# Patient Record
Sex: Male | Born: 1955 | Race: Black or African American | Hispanic: No | Smoking: Former smoker
Health system: Southern US, Community
[De-identification: ages and names within clinical notes are randomized; demographics above are authoritative.]

## PROBLEM LIST (undated history)

## (undated) DIAGNOSIS — I639 Cerebral infarction, unspecified: Secondary | ICD-10-CM

## (undated) DIAGNOSIS — Z66 Do not resuscitate: Secondary | ICD-10-CM

## (undated) DIAGNOSIS — E7800 Pure hypercholesterolemia, unspecified: Secondary | ICD-10-CM

## (undated) DIAGNOSIS — N189 Chronic kidney disease, unspecified: Secondary | ICD-10-CM

## (undated) DIAGNOSIS — C180 Malignant neoplasm of cecum: Principal | ICD-10-CM

## (undated) DIAGNOSIS — Z992 Dependence on renal dialysis: Secondary | ICD-10-CM

## (undated) DIAGNOSIS — C801 Malignant (primary) neoplasm, unspecified: Secondary | ICD-10-CM

## (undated) DIAGNOSIS — N186 End stage renal disease: Secondary | ICD-10-CM

## (undated) DIAGNOSIS — I10 Essential (primary) hypertension: Secondary | ICD-10-CM

## (undated) DIAGNOSIS — E119 Type 2 diabetes mellitus without complications: Secondary | ICD-10-CM

## (undated) DIAGNOSIS — K219 Gastro-esophageal reflux disease without esophagitis: Secondary | ICD-10-CM

## (undated) HISTORY — DX: Malignant neoplasm of cecum: C18.0

## (undated) HISTORY — DX: Do not resuscitate: Z66

## (undated) HISTORY — DX: End stage renal disease: N18.6

## (undated) HISTORY — PX: DIALYSIS FISTULA CREATION: SHX611

---

## 1998-09-27 HISTORY — PX: OTHER SURGICAL HISTORY: SHX169

## 1999-09-28 HISTORY — PX: AMPUTATION TOE: SHX6595

## 2007-08-28 ENCOUNTER — Ambulatory Visit: Payer: Self-pay | Admitting: Cardiology

## 2008-09-27 DIAGNOSIS — Z992 Dependence on renal dialysis: Secondary | ICD-10-CM

## 2008-09-27 HISTORY — DX: Dependence on renal dialysis: Z99.2

## 2014-07-28 DIAGNOSIS — C801 Malignant (primary) neoplasm, unspecified: Secondary | ICD-10-CM

## 2014-07-28 HISTORY — PX: COLECTOMY: SHX59

## 2014-07-28 HISTORY — DX: Malignant (primary) neoplasm, unspecified: C80.1

## 2014-09-16 ENCOUNTER — Other Ambulatory Visit (HOSPITAL_COMMUNITY): Payer: Self-pay | Admitting: Nephrology

## 2014-09-16 ENCOUNTER — Encounter (HOSPITAL_COMMUNITY): Payer: Self-pay

## 2014-09-16 ENCOUNTER — Ambulatory Visit (HOSPITAL_COMMUNITY)
Admission: RE | Admit: 2014-09-16 | Discharge: 2014-09-16 | Disposition: A | Discharge status: Home / Self Care | Payer: PRIVATE HEALTH INSURANCE | Source: Ambulatory Visit | Attending: Nephrology | Admitting: Nephrology

## 2014-09-16 DIAGNOSIS — Y828 Other medical devices associated with adverse incidents: Secondary | ICD-10-CM | POA: Insufficient documentation

## 2014-09-16 DIAGNOSIS — Y832 Surgical operation with anastomosis, bypass or graft as the cause of abnormal reaction of the patient, or of later complication, without mention of misadventure at the time of the procedure: Secondary | ICD-10-CM | POA: Insufficient documentation

## 2014-09-16 DIAGNOSIS — N186 End stage renal disease: Secondary | ICD-10-CM | POA: Diagnosis not present

## 2014-09-16 DIAGNOSIS — Z992 Dependence on renal dialysis: Secondary | ICD-10-CM | POA: Insufficient documentation

## 2014-09-16 DIAGNOSIS — T82868A Thrombosis of vascular prosthetic devices, implants and grafts, initial encounter: Secondary | ICD-10-CM | POA: Insufficient documentation

## 2014-09-16 DIAGNOSIS — E7800 Pure hypercholesterolemia, unspecified: Secondary | ICD-10-CM | POA: Insufficient documentation

## 2014-09-16 HISTORY — DX: Chronic kidney disease, unspecified: N18.9

## 2014-09-16 HISTORY — DX: Pure hypercholesterolemia, unspecified: E78.00

## 2014-09-16 LAB — POTASSIUM: POTASSIUM: 5.9 meq/L — AB (ref 3.7–5.3)

## 2014-09-16 MED ORDER — FENTANYL CITRATE 0.05 MG/ML IJ SOLN
INTRAMUSCULAR | Status: AC
  Filled 2014-09-16: qty 4

## 2014-09-16 MED ORDER — FENTANYL CITRATE 0.05 MG/ML IJ SOLN
INTRAMUSCULAR | Status: AC | PRN
  Administered 2014-09-16: 50 ug via INTRAVENOUS

## 2014-09-16 MED ORDER — GELATIN ABSORBABLE 12-7 MM EX MISC
CUTANEOUS | Status: AC
  Filled 2014-09-16: qty 1

## 2014-09-16 MED ORDER — CEFAZOLIN SODIUM-DEXTROSE 2-3 GM-% IV SOLR
2.0000 g | Freq: Once | INTRAVENOUS | Status: AC
  Administered 2014-09-16: 2 g via INTRAVENOUS

## 2014-09-16 MED ORDER — SODIUM CHLORIDE 0.9 % IJ SOLN
10.0000 mL | Freq: Two times a day (BID) | INTRAMUSCULAR | Status: DC
Start: 2014-09-16 — End: 2014-09-17

## 2014-09-16 MED ORDER — MIDAZOLAM HCL 2 MG/2ML IJ SOLN
INTRAMUSCULAR | Status: AC
  Filled 2014-09-16: qty 4

## 2014-09-16 MED ORDER — MIDAZOLAM HCL 2 MG/2ML IJ SOLN
INTRAMUSCULAR | Status: AC | PRN
  Administered 2014-09-16: 1 mg via INTRAVENOUS

## 2014-09-16 MED ORDER — SODIUM CHLORIDE 0.9 % IJ SOLN
10.0000 mL | INTRAMUSCULAR | Status: DC | PRN
  Administered 2014-09-16: 10 mL

## 2014-09-16 MED ORDER — LIDOCAINE-EPINEPHRINE (PF) 1 %-1:200000 IJ SOLN
INTRAMUSCULAR | Status: AC
  Filled 2014-09-16: qty 10

## 2014-09-16 MED ORDER — HEPARIN SOD (PORK) LOCK FLUSH 100 UNIT/ML IV SOLN
500.0000 [IU] | INTRAVENOUS | Status: DC | PRN
  Administered 2014-09-16: 500 [IU]

## 2014-09-16 MED ORDER — HEPARIN SOD (PORK) LOCK FLUSH 100 UNIT/ML IV SOLN: 500.0000 [IU] | INTRAVENOUS | Status: DC

## 2014-09-16 MED ORDER — CEFAZOLIN SODIUM-DEXTROSE 2-3 GM-% IV SOLR
INTRAVENOUS | Status: AC
  Administered 2014-09-16: 2 g via INTRAVENOUS
  Filled 2014-09-16: qty 50

## 2014-09-16 MED ORDER — HEPARIN SODIUM (PORCINE) 1000 UNIT/ML IJ SOLN
INTRAMUSCULAR | Status: AC
  Filled 2014-09-16: qty 1

## 2014-09-16 MED ORDER — SODIUM CHLORIDE 0.9 % IV SOLN
INTRAVENOUS | Status: AC | PRN
  Administered 2014-09-16: 50 mL/h via INTRAVENOUS

## 2014-09-16 NOTE — Discharge Instructions (Addendum)
Vascular Access for Hemodialysis °A vascular access is a connection between two blood vessels that allows blood to be easily removed from the body and returned to the body during hemodialysis. Hemodialysis is a procedure in which a machine outside of the body filters the blood. There are three types of vascular accesses:  °· Arteriovenous fistula. This is a connection between an artery and a vein (usually in the arm) that is made by sewing them together. Blood in the artery flows directly into the vein, causing it to get larger over time. This makes it easier for the vein to be used for hemodialysis. An arteriovenous fistula takes 1-6 months to develop after surgery.   °· Arteriovenous graft. This is a connection between an artery and a vein in the arm that is made with a tube. An arteriovenous graft can be used within 2-3 weeks of surgery.   °· Venous catheter. This is a thin, flexible tube that is placed in a large vein (usually in the neck, chest, or groin). A venous catheter for hemodialysis contains two tubes that come out of the skin. A venous catheter can be used right away. It is usually used as a temporary access if you need hemodialysis before a fistula or graft has developed. It may also be used as a permanent access if a fistula or graft cannot be created. °WHICH TYPE OF ACCESS IS BEST FOR ME? °The type of access that is best for you depends on the size and strength of your veins.  °A fistula is usually the preferred type of access. It can last several years and is less likely than the other types of accesses to become infected or to cause blood clots within a blood vessel (thrombosis). However, a fistula is not an option for everyone. If your veins are not the right size, a graft may be used instead. Grafts require you to have strong veins. If your veins are not strong enough for a graft, a catheter may be used. Catheters are more likely than fistulas and grafts to become infected or to have thrombosis.   °Sometimes, only one type of access is an option. Your health care provider will help you determine which type of access is best for you.  °HOW IS A VASCULAR ACCESS USED? °The way the access is used depends on the type of access:  °· If the access is a fistula or graft, two needles are inserted through the skin into the access before each hemodialysis session. Blood leaves the body through one of the needles and travels through a tube to the hemodialysis machine (dialyzer). It then flows through another tube and returns to the body through the second needle.   °· If the access is a catheter, one tube is connected directly to the tube that leads to the dialyzer and the other is connected to a tube that leads away from the dialyzer. Blood leaves the body through one tube and returns to the body through the other.   °WHAT KIND OF PROBLEMS CAN OCCUR WITH VASCULAR ACCESSES? °· Blood clots within a blood vessel (thrombosis). Thrombosis can lead to a narrowing of a blood vessel or tube (stenosis). If thrombosis occurs frequently, another access site may be created as a backup.   °· Infection.   °These problems are most likely to occur with a venous catheter and least likely to occur with an arteriovenous fistula.  °HOW DO I CARE FOR MY VASCULAR ACCESS? °Wear a medical alert bracelet. This tells health care providers that you are   a dialysis patient in the case of an emergency and allows them to care for your veins appropriately. If you have a graft or fistula:   A "bruit" is a noise that is heard with a stethoscope and a "thrill" is a vibration felt over the graft or fistula. The presence of the bruit and thrill indicates that the access is working. You will be taught to feel for the thrill each day. If this is not felt, the access may be clotted. Call your health care provider.   You may use the arm where your vascular access is located freely after the site heals. Keep the following in mind:   Avoid pressure on  the arm.   Avoid lifting heavy objects with the arm.   Avoid sleeping on the arm.   Avoid wearing tight-sleeved shirts or jewelry around the graft or fistula.   Do not allow blood pressure monitoring or needle punctures on the side where the graft or fistula is located.   With permission from your health care provider, you may do exercises to help with blood flow through a fistula. These exercises involve squeezing a rubber ball or other soft objects as instructed. SEEK MEDICAL CARE IF:   Chills develop.   You have an oral temperature above 102 F (38.9 C).  Swelling around the graft or fistula gets worse.   New pain develops.   Pus or other fluid (drainage) is seen at the vascular access site.   Skin redness or red streaking is seen on the skin around, above, or below the vascular access. SEEK IMMEDIATE MEDICAL CARE IF:   Pain, numbness, or an unusual pale skin color develops in the hand on the side of your fistula.   Dizziness or weakness develops that you have not had before.   The vascular access has bleeding that cannot be easily controlled. Document Released: 12/04/2002 Document Revised: 01/28/2014 Document Reviewed: 01/30/2013 [facility] Patient Information 2015 [place], [place]. This information is not intended to replace advice given to you by your health care provider. Make sure you discuss any questions you have with your health care provider. Conscious Sedation Sedation is the use of medicines to promote relaxation and relieve discomfort and anxiety. Conscious sedation is a type of sedation. Under conscious sedation you are less alert than normal but are still able to respond to instructions or stimulation. Conscious sedation is used during short medical and dental procedures. It is milder than deep sedation or general anesthesia and allows you to return to your regular activities sooner.  LET [facility] CARE PROVIDER KNOW ABOUT:   Any allergies you  have.  All medicines you are taking, including vitamins, herbs, eye drops, creams, and over-the-counter medicines.  Use of steroids (by mouth or creams).  Previous problems you or members of your family have had with the use of anesthetics.  Any blood disorders you have.  Previous surgeries you have had.  Medical conditions you have.  Possibility of pregnancy, if this applies.  Use of cigarettes, alcohol, or illegal drugs. RISKS AND COMPLICATIONS Generally, this is a safe procedure. However, as with any procedure, problems can occur. Possible problems include:  Oversedation.  Trouble breathing on your own. You may need to have a breathing tube until you are awake and breathing on your own.  Allergic reaction to any of the medicines used for the procedure. BEFORE THE PROCEDURE  You may have blood tests done. These tests can help show how well your kidneys and liver are  working. They can also show how well your blood clots.  A physical exam will be done.  Only take medicines as directed by your health care provider. You may need to stop taking medicines (such as blood thinners, aspirin, or nonsteroidal anti-inflammatory drugs) before the procedure.   Do not eat or drink at least 6 hours before the procedure or as directed by your health care provider.  Arrange for a responsible adult, family member, or friend to take you home after the procedure. He or she should stay with you for at least 24 hours after the procedure, until the medicine has worn off. PROCEDURE   An intravenous (IV) catheter will be inserted into one of your veins. Medicine will be able to flow directly into your body through this catheter. You may be given medicine through this tube to help prevent pain and help you relax.  The medical or dental procedure will be done. AFTER THE PROCEDURE  You will stay in a recovery area until the medicine has worn off. Your blood pressure and pulse will be checked.    Depending on the procedure you had, you may be allowed to go home when you can tolerate liquids and your pain is under control. Document Released: 06/08/2001 Document Revised: 09/18/2013 Document Reviewed: 05/21/2013 [facility] Patient Information 2015 [place], [place]. This information is not intended to replace advice given to you by your health care provider. Make sure you discuss any questions you have with your health care provider.

## 2014-09-16 NOTE — Sedation Documentation (Signed)
Report to L. Smith RN.

## 2014-09-16 NOTE — Procedures (Signed)
Successful RT IJ HD CATH TIP SVC/RA NO COMP STABLE READY FOR USE

## 2014-09-16 NOTE — Sedation Documentation (Signed)
Patient denies pain and is resting comfortably. Can be discharged at 1430. IV team notified to remove portacath access.

## 2014-09-16 NOTE — H&P (Signed)
Chief Complaint: Clotted Rt lower arm dialysis fistula  Referring Physician(s): Befekadu,Belayenh S  History of Present Illness: Nathaniel Johnston is a 58 y.o. male  Last use 09/12/14: slow  Unable to use on Sat: clotted Pt has had declot previously- no records at this facility Request made per Dr Hinda Lenis for fistula thrombolysis Now scheduled for Rt arm dialysis fistula thrombolysis with possible angioplasty/stent placement Possible dialysis catheter placement if needed   Past Medical History  Diagnosis Date  . Chronic kidney disease   . High cholesterol     Past Surgical History  Procedure Laterality Date  . Dialysis fistula creation Right     Allergies: Review of patient's allergies indicates no known allergies.  Medications: Prior to Admission medications   Medication Sig Start Date End Date Taking? Authorizing Provider  amLODipine (NORVASC) 10 MG tablet Take 2 tablets by mouth. 07/29/14  Yes Historical Provider, MD  calcium acetate (PHOSLO) 667 MG capsule Take 1 capsule by mouth. 07/15/14  Yes Historical Provider, MD  glipiZIDE (GLUCOTROL) 10 MG tablet Take 1 tablet by mouth. 07/01/14  Yes Historical Provider, MD  Iron Polysacch Cmplx-B12-FA 150-0.025-1 MG CAPS Take 1 capsule by mouth. 08/27/14  Yes Historical Provider, MD  labetalol (NORMODYNE) 300 MG tablet Take 2 tablets by mouth. 06/02/10  Yes Historical Provider, MD  lisinopril (PRINIVIL,ZESTRIL) 40 MG tablet Take 1 tablet by mouth. 06/02/10  Yes Historical Provider, MD  loperamide (IMODIUM) 2 MG capsule Take 2 mg by mouth. 08/29/14  Yes Historical Provider, MD  ondansetron (ZOFRAN) 8 MG tablet Take 8 mg by mouth. 08/29/14  Yes Historical Provider, MD  potassium chloride (K-DUR) 10 MEQ tablet Once daily 08/27/14  Yes Historical Provider, MD  sevelamer carbonate (RENVELA) 800 MG tablet Take 1 tablet by mouth. 07/30/14  Yes Historical Provider, MD  simvastatin (ZOCOR) 80 MG tablet Take 1 tablet by mouth. 08/17/14  Yes  Historical Provider, MD  Skin Protectants, Misc. (EUCERIN) cream Apply topically. 08/29/14  Yes Historical Provider, MD  triamcinolone cream (KENALOG) 0.1 % Apply topically. 06/05/14  Yes Historical Provider, MD    History reviewed. No pertinent family history.  History   Social History  . Marital Status: Married    Spouse Name: N/A    Number of Children: N/A  . Years of Education: N/A   Social History Main Topics  . Smoking status: Unknown If Ever Smoked  . Smokeless tobacco: None  . Alcohol Use: None  . Drug Use: None  . Sexual Activity: None   Other Topics Concern  . None   Social History Narrative  . None    Review of Systems: A 12 point ROS discussed and pertinent positives are indicated in the HPI above.  All other systems are negative.  Review of Systems  Constitutional: Negative for activity change.  Respiratory: Negative for chest tightness and shortness of breath.   Cardiovascular: Negative for chest pain.  Gastrointestinal: Negative for abdominal pain.  Musculoskeletal: Positive for joint swelling.       Lt ankle sprain  Neurological: Negative for weakness.  Psychiatric/Behavioral: Negative for behavioral problems and confusion.    Vital Signs: BP 160/65 mmHg  Temp(Src) 98.4 F (36.9 C) (Oral)  Resp 18  SpO2 100%  Physical Exam  Constitutional: He is oriented to person, place, and time. He appears well-nourished.  Cardiovascular: Normal rate and regular rhythm.   No murmur heard. Pulmonary/Chest: Effort normal. He has no wheezes.  Abdominal: Soft. Bowel sounds are normal.  Musculoskeletal: Normal range  of motion.  Neurological: He is alert and oriented to person, place, and time.  Skin: Skin is warm and dry.  Psychiatric: He has a normal mood and affect. His behavior is normal. Judgment and thought content normal.    Imaging: No results found.  Labs:  CBC: No results for input(s): WBC, HGB, HCT, PLT in the last 8760 hours.  COAGS: No  results for input(s): INR, APTT in the last 8760 hours.  BMP: No results for input(s): NA, K, CL, CO2, GLUCOSE, BUN, CALCIUM, CREATININE, GFRNONAA, GFRAA in the last 8760 hours.  Invalid input(s): CMP  LIVER FUNCTION TESTS: No results for input(s): BILITOT, AST, ALT, ALKPHOS, PROT, ALBUMIN in the last 8760 hours.  TUMOR MARKERS: No results for input(s): AFPTM, CEA, CA199, CHROMGRNA in the last 8760 hours.  Assessment and Plan:  Rt arm dialysis fistula thrombolysis possible pta/stnt placement Possible dialysis catheter placement Pt aware of procedure benefits and risks and agreeable to proceed Consent signed and in chart  Thank you for this interesting consult.  I greatly enjoyed meeting Nathaniel Johnston and look forward to participating in their care.   I spent a total of 20 minutes face to face in clinical consultation, greater than 50% of which was counseling/coordinating care for R arm dialysis fistula thrombolysis/poss catheter placement  Signed: Dariyon Urquilla A 09/16/2014, 11:34 AM

## 2015-06-11 ENCOUNTER — Other Ambulatory Visit (HOSPITAL_COMMUNITY): Payer: Self-pay

## 2015-06-12 NOTE — Patient Instructions (Addendum)
Nathaniel Johnston  06/12/2015     @PREFPERIOPPHARMACY @   Your procedure is scheduled on 06/16/2015.  Report to [facility] at 6:30 A.M.  Call this number if you have problems the morning of surgery:  [phone]   Remember:  Do not eat food or drink liquids after midnight.  Take these medicines the morning of surgery with A SIP OF WATER Amlodipine, Lisinopril, Zofran, Renvela, Labetalol   Do not wear jewelry, make-up or nail polish.  Do not wear lotions, powders, or perfumes.  You may wear deodorant.  Do not shave 48 hours prior to surgery.  Men may shave face and neck.  Do not bring valuables to the hospital.  [facility] is not responsible for any belongings or valuables.  Contacts, dentures or bridgework may not be worn into surgery.  Leave your suitcase in the car.  After surgery it may be brought to your room.  For patients admitted to the hospital, discharge time will be determined by your treatment team.  Patients discharged the day of surgery will not be allowed to drive home.    Please read over the following fact sheets that you were given. Anesthesia Post-op Instructions     PATIENT INSTRUCTIONS POST-ANESTHESIA  IMMEDIATELY FOLLOWING SURGERY:  Do not drive or operate machinery for the first twenty four hours after surgery.  Do not make any important decisions for twenty four hours after surgery or while taking narcotic pain medications or sedatives.  If you develop intractable nausea and vomiting or a severe headache please notify your doctor immediately.  FOLLOW-UP:  Please make an appointment with your surgeon as instructed. You do not need to follow up with anesthesia unless specifically instructed to do so.  WOUND CARE INSTRUCTIONS (if applicable):  Keep a dry clean dressing on the anesthesia/puncture wound site if there is drainage.  Once the wound has quit draining you may leave it open to air.  Generally you should leave the bandage intact for twenty four hours  unless there is drainage.  If the epidural site drains for more than 36-48 hours please call the anesthesia department.  QUESTIONS?:  Please feel free to call your physician or the hospital operator if you have any questions, and they will be happy to assist you.      Cataract Surgery  A cataract is a clouding of the lens of the eye. When a lens becomes cloudy, vision is reduced based on the degree and nature of the clouding. Surgery may be needed to improve vision. Surgery removes the cloudy lens and usually replaces it with a substitute lens (intraocular lens, IOL). LET YOUR EYE DOCTOR KNOW ABOUT:  Allergies to food or medicine.  Medicines taken including herbs, eye drops, over-the-counter medicines, and creams.  Use of steroids (by mouth or creams).  Previous problems with anesthetics or numbing medicine.  History of bleeding problems or blood clots.  Previous surgery.  Other health problems, including diabetes and kidney problems.  Possibility of pregnancy, if this applies. RISKS AND COMPLICATIONS  Infection.  Inflammation of the eyeball (endophthalmitis) that can spread to both eyes (sympathetic ophthalmia).  Poor wound healing.  If an IOL is inserted, it can later fall out of proper position. This is very uncommon.  Clouding of the part of your eye that holds an IOL in place. This is called an "after-cataract." These are uncommon but easily treated. BEFORE THE PROCEDURE  Do not eat or drink anything except small amounts of water for 8  to 12 before your surgery, or as directed by your caregiver.  Unless you are told otherwise, continue any eye drops you have been prescribed.  Talk to your primary caregiver about all other medicines that you take (both prescription and nonprescription). In some cases, you may need to stop or change medicines near the time of your surgery. This is most important if you are taking blood-thinning medicine.Do not stop medicines unless you  are told to do so.  Arrange for someone to drive you to and from the procedure.  Do not put contact lenses in either eye on the day of your surgery. PROCEDURE There is more than one method for safely removing a cataract. Your doctor can explain the differences and help determine which is best for you. Phacoemulsification surgery is the most common form of cataract surgery.  An injection is given behind the eye or eye drops are given to make this a painless procedure.  A small cut (incision) is made on the edge of the clear, dome-shaped surface that covers the front of the eye (cornea).  A tiny probe is painlessly inserted into the eye. This device gives off ultrasound waves that soften and break up the cloudy center of the lens. This makes it easier for the cloudy lens to be removed by suction.  An IOL may be implanted.  The normal lens of the eye is covered by a clear capsule. Part of that capsule is intentionally left in the eye to support the IOL.  Your surgeon may or may not use stitches to close the incision. There are other forms of cataract surgery that require a larger incision and stitches to close the eye. This approach is taken in cases where the doctor feels that the cataract cannot be easily removed using phacoemulsification. AFTER THE PROCEDURE  When an IOL is implanted, it does not need care. It becomes a permanent part of your eye and cannot be seen or felt.  Your doctor will schedule follow-up exams to check on your progress.  Review your other medicines with your doctor to see which can be resumed after surgery.  Use eye drops or take medicine as prescribed by your doctor. Document Released: 09/02/2011 Document Revised: 01/28/2014 Document Reviewed: 09/02/2011 [facility] Patient Information 2015 [place], [place]. This information is not intended to replace advice given to you by your health care provider. Make sure you discuss any questions you have with your health care  provider.

## 2015-06-13 ENCOUNTER — Other Ambulatory Visit: Payer: Self-pay

## 2015-06-13 ENCOUNTER — Encounter (HOSPITAL_COMMUNITY)
Admission: RE | Admit: 2015-06-13 | Discharge: 2015-06-13 | Disposition: A | Discharge status: Home / Self Care | Payer: Medicaid Other | Source: Ambulatory Visit | Attending: Ophthalmology | Admitting: Ophthalmology

## 2015-06-13 ENCOUNTER — Encounter (HOSPITAL_COMMUNITY): Payer: Self-pay

## 2015-06-13 DIAGNOSIS — H268 Other specified cataract: Secondary | ICD-10-CM | POA: Insufficient documentation

## 2015-06-13 DIAGNOSIS — Z01818 Encounter for other preprocedural examination: Secondary | ICD-10-CM | POA: Diagnosis present

## 2015-06-13 HISTORY — DX: Essential (primary) hypertension: I10

## 2015-06-13 HISTORY — DX: Cerebral infarction, unspecified: I63.9

## 2015-06-13 HISTORY — DX: Gastro-esophageal reflux disease without esophagitis: K21.9

## 2015-06-13 HISTORY — DX: Type 2 diabetes mellitus without complications: E11.9

## 2015-06-13 HISTORY — DX: Malignant (primary) neoplasm, unspecified: C80.1

## 2015-06-13 MED ORDER — TETRACAINE HCL 0.5 % OP SOLN
OPHTHALMIC | Status: AC
  Filled 2015-06-13: qty 2

## 2015-06-13 MED ORDER — CYCLOPENTOLATE-PHENYLEPHRINE OP SOLN OPTIME - NO CHARGE
OPHTHALMIC | Status: AC
  Filled 2015-06-13: qty 2

## 2015-06-13 MED ORDER — LIDOCAINE HCL 3.5 % OP GEL
OPHTHALMIC | Status: AC
  Filled 2015-06-13: qty 1

## 2015-06-15 NOTE — H&P (Signed)
Surgeon's preoperative summary of results and expectations for patient: Nathaniel Johnston    Activity limitations caused by visual disabilty related to cataract(s). Pt states that after sneezing early this morning they started seeing spider webs OU that blocks out part of vision.  Pt was seen at [facility] ED early this morning   Best corrected visual acuity:   Right Eye Left Eye  Distance Acuity   200   Near Acuity   16    I certify that my patient's record contains the following information:  The record contains digital ultrasonic biometry for the purpose of determining the correct implant power for the operative eye.  The patients' impairment of visual function is believed not to be correctable with a tolerable change in glasses or contact lenses.  Cataract (in the operative eye) is believed to be significantly contributing to the patient's visual impairment.  The patient desires surgical correction;  the risks, benefits, and alternatives have been expalined; and a reasonable expectation exists that lens surgery will significantly improve both the visual and functional status of the patient.  I certify the statements on this sheet are true to the best of my knowledge.   Patient's statement of visual disability OD:  Has indicated that he/she has difficulty in the following tasks associated with daily living: difficulty reading small print on books, newspapers, labels, etc. difficulty watching television due to glare or inability to read captions difficulty driving due to inability to read traffic signals or street signs difficulty driving at night due to glare, halos, or rings Blurry or hazy vision Light sensitivity or experiencing glare on sunny days Decreased vision at night difficulty recognizing people from a distance    *   Lala Lund, MD 06/11/15 11:18:45 Digitally signed to expedite the transference of documentation.      [place], [place] Lala Lund., M.D. Surgical Request and Orders  Procedure Date:  06/16/15       Patient:  Nathaniel Johnston, Nathaniel Johnston  SSN:  [id number]   DOB:  [birth year]   Sex:  male  Address:  [address]  [place],  [place]   [address]  Home Phone:  [phone]  Work:   cell:  [phone]   Primary Insurance:  Rebecca ID#:  [id number]  Grp#:  [id number]  Precert required?  No Pre-cert #:       Secondary Ins:  Medicaid  ID# [id number]  Grp#:    Precert required?  No Pre-cert #:    Procedure:  Phacoemulsification with lens implant OD (RIGHT EYE)  Preoperative Diagnosis/symptoms:  surgical cataract of right eye  Reason for Surgery:  The patient complains of difficulty performing daily activities, including but not limited to reading and drivingplease see chief complaint for details. Special instructions/orders:  Please add 4.0 mL Omidria (phenylephrine 1% / ketorolac 0.3%) to 500 mL BSS irrigation solution    Lala Lund, MD 06/11/15 11:19:47 Digitally signed to expedite the transference of documentation.        [facility] PHYSCIAN ORDERS:  ANESTHESIA:  Topical/Monitored Anesthesia Care   ALLERGIES:    NKDA   HOSPITAL DISPOSITION:  Short Stay Outpatient   LABORATORY:  Per Anesthesia request only. Please call Dr. Iona Hansen' office for panic values  [phone] or [phone]).   X-RAY:  Per Anesthesia request only.  HEART STATION:  EKG  Per anesthesia request only.  PHARMACY: Purchased at retail pharmacy:   1)  Gatifloxacin 0.5% Eye drops, 1 drop OU qid, begin 3 days prior to surgery, repeat in a.m. prior to leaving for the hospital, and to operated eye only qid, begin 3 hours post op.  Moxifloxacin 0.5% may be substituted for gatifloxacin using the same instructions.    2)   Ketorolac 0.5% Eye drops, 1 drop to each eye qid, begin one day prior to surgery and before leaving for the hospital, and to operative eye only qid, begin 3 hours post op. 3)   prednisolone acetate 1%  eye drops, 1 drop qid to right eye, begin 3 hours post op.      PRE OP ORDERS:   1)   Instil 1 drop of cyclomydril* Oph q 5 min. X 3 to the right eye on arrival in Short Stay Unit. * NOTE  cyclopentylate 1% and phenylephrine .25% or homatropine 5% and phenylephrine .25% ophthalmic drops may be substitued for cyclomydril, one drop of each q 5 minutes x 3 doses to right eye on arrival to [facility]                          2)   Instill 1 drop tetracaine 0.5% Oph to right eye q 5 minutes x 3 on arrival to short stay and once on   arrival to OR. 3)   Instill 1 drop Betadine 5% eye drops to right eye, following tetracaine drops and immediately    upon arrival in OR. 4)   Instill 2 drops lidocaine Oph gel 3.5% to right eye in OR before pre-op prep   SPECIAL INSTRUCTIONS/ORDERS:  Please add 4.0 mL Omidria (phenylephrine 1% / ketorolac 0.3%) to 500 mL BSS irrigation solution     POST OP DISCHARGE ORDERS/PATIENT INSTRUCTIONS: 1)  Resume Diet/previous med orders per med reconciliation form:   2)  F/U appt at Dr. Iona Hansen Office in [place], 06/18/15@11 :15 am   3)   D/C from Short Stay per protocol. 4)   Begin Pred Forte (prednisolone acetate 1%), Ketorolac 0.5% and  gatifloxacin eye drops;  1 drop each 4 times daily to operative eye, begin 3 hrs. post D/C from Short Stay Unit.  Moxifloxacin 0.5% may be substituted for gatifloxacin using the same instructions. 5)   Page Dr. Iona Hansen via cell phone [phone]) if significant pain in or around operative eye. 6)   If  patient on Plavix pre-op, restart at pre op. dose on evening of surgery. 7)   Wear dark glasses as necessary for excessive light sensitivity post op. 8)   Instruct patient not to forcefully rub operative eye. 9)   Keep eye dry for 1 week.  Patient may gently wipe lids with damp wash cloth. 10)  Patient may resume normal occupational activities in one week and resume driving as tolerated after the first  post operative visit. 11)  It is normal to have  blurred vision and a scratchy sensation following surgery.      Lala Lund, MD 06/11/15 11:19:33 Digitally signed to expedite the transference of documentation.*    I have received a copy of "Dr. Iona Hansen Post Operative Instructions for Cataract Patients" and will abide by them. Patient signature:  Witness signature:  Date/Time:      [facility] [address] [place], [place] [address]   Dr. Iona Hansen Post Operative Instructions For Cataract Patients  These instructions are for Nathaniel Johnston  and pertain to the operative RIGHT EYE.  1)  Resume your Diet and previous oral medicines as you normally use them.   2)  Your Follow up appointment is at Dr. Iona Hansen' Office in [place] on 06/18/15@11 :15 am   3)   You may leave the hospital when your driver is present and your nurse releases you. 4)  Begin Pred Forte (prednisolone acetate 1%), Ketorolac 0.5% and gatifloxacin 0.5% eye drops;  1 drop each 4 times daily to operative eye, begin 3 hrs. Following discharge from Short Stay Unit.  Moxifloxacin 0.5%) may be substituted for gatifloxacin using the same instructions. 5)  Page Dr. Iona Hansen via beeper [phone]) for significant pain in or around operative eye that is not relieved with Tylenol. 6)   If you took Plavix before surgery, restart it at the usual dose on the evening of surgery. 7)   Wear dark glasses as necessary for excessive light sensitivity. 8)   Do not to forcefully rub your right eye. 9)   Keep your right eye dry for 1 week.  You may gently clean your eyelids with a damp wash cloth. 10) You may resume normal occupational activities in one week and resume driving as tolerated after the first post operative visit. 11)  It is normal to have blurred vision and a scratchy sensation following surgery.         Pre Surgical History and Physical Exam  Patient:  Nathaniel Johnston     Date:      06/11/2015 11:29:54  AM  Chief Complaint Pt states that after sneezing early this morning they started seeing spider webs OU that blocks out part of vision.  Pt was seen at [facility] ED early this morning  +HPI:   Location OU  Quality Pt states that after sneezing early this morning they started seeing spider webs OU that blocks out part of vision.  Severity moderate  Duration x hrs  Timing constant  Context Hx:  RD OS, DM x 15 yrs, BS@stable , A1C-6.5, HTN-controlled  Modifying Factors Art tears BID OU;  Dialysis T, TH, S  Medical History:              Vit hem OS Diabetes, uncomplicated Cataract, brunescent OD Cataract, PSC OD Cataract, Nuclear OS Blurred vision Presbyopia Astigmatism Myopia Family Hx:                      non contributary  Social Hx:                        Allergy:                          NKDA Med List:                        Amlodipine 10mg , Cephalexin 500mg , Glipizide 10mg , Labetalol 100mg , Lisinopril 40mg , Simvastatin 80mg ,Tramadol 50 mg  Notes:                               Review of Systems:   The patient admits to: and Hypertension  The patient admits to: Hx of Colon Cancer  The patient admits WN:[id number] Renal Failure  The patient admits  to: Diabetes   Physical Exam: General:  The patient is a well nourished, well developed White male.  Systolic: Diastolic: Pulse:  [phone]    SHEENT:          Normocephalic, normally pigmented, atraumatic  Neck:              without mass, midline structural deviation or malposition      Chest:             clear to auscultation bilaterally Breast:            Deferred  Heart:             The heart sounds are normal.  There is no murmur or gallop present  Abdomen:       Normal bowel sounds present  Pelvic:            Deferred GU:                 Deferred Extremities:    symmetric without pretibial edema Skin:               Deferred  Neurological:  Pt oriented, cranial nerves grossly normal   Eye Exam:   Best corrected Vision Right:   20/      Left:  20/200  Spectacle Rx Right:   Spectacle Rx Left:   -0.25-0.75x180   Eye Exam                RIGHT   Lids:                       Lids function normally and are symmetrically, and normally positioned with clean/quiet margins OU.   Conjunctiva:           White and quiet  Cornea:                  Clear, with adequate cell count  Anterior Chamber:  deep and quiet  Iris:                        The iris is uniformly colored with a round symmetric pupil.  Lens:                      4+ nuclear sclerosis  Anterior Vitreous:   Clear and quiet Tonometry, right:   13    left:  14    Fundus, disc:           The discs are  normal, healthy appearing OU.  Fundus, macula:      The background, macula, and vessels are normal in appearance.  Dr. Iona Hansen Notes:    Please add 4.0 mL Omidria (phenylephrine 1% / ketorolac 0.3%) to 500 mL BSS irrigation solution no items of interest  Ultrasonic Digital Biometry OD:  23.50                       OD                        PC Lens (primary lens)  Alcon [id number]   Lens Power +21.00   Calc. Ametropia plano         AC Lens (Alt. lens) Alcon [id number]      Lens Power +18.00  Diagnosis:     Surgical Cataract RIGHT has been specifically requested by the patient with reasonable eexpectation of achieving stated visual/functional goals.  Alternative treatments, including optical correction have been discussed with the patient as well as the mitigating effects of ocular co-morbidities on final vision.   Patient's statement of visual disability OD:  Has indicated that he/she has difficulty in the following tasks associated with daily living: difficulty reading small print on books, newspapers, labels, etc. difficulty watching television due to glare or inability to read captions difficulty driving due to inability to read traffic signals or street signs difficulty driving at night due to glare, halos, or rings Blurry or hazy vision Light sensitivity or  experiencing glare on sunny days Decreased vision at night difficulty recognizing people from a distance     Surgical Plan:  Phacoemulsification with lens implant OD (RIGHT EYE)   Post Op Appt:  06/18/15@11 :15 am  in [facility].    Lala Lund, MD 06/11/15 11:19:15 Digitally signed to expedite the transference of documentation.*    [facility] EYE DROP INSTRUCTIONS BEFORE SURGERY THESE MEDICATIONS MUST BE PURCHASED AT YOUR PHARMACY BEFORE YOUR PREOP OFFICE EVALUATION  GATIFLOXACIN  0.5% EYE DROPS OR MOXIFLOXACIN 0.5% EYE DROPS:  USE ONE DROP TO BOTH EYES 4 TIMES DAILY BEGINNING ON THE FRIDAY MORNING BEFORE SURGERY AND ONCE TO BOTH EYES BEFORE LEAVING FOR SURGERY MONDAY MORNING.  KETOROLAC 0.5% EYE DROPS:  USE ONE DROP 4 TIMES DAILY TO BOTH EYES BEGINNING ON THE SUNDAY MORNING BEFORE SURGERY AND AGAIN ON MONDAY MORNING BEFORE LEAVING FOR SURGERY.  PREDNISOLONE ACETATE 1% EYE DROPS:  DO NOT USE THESE BEFORE SURGERY  PLEASE TAKE YOUR DROPS WITH YOU TO [place] AND MAKE SURE THEY ARE RETURNED TO YOU BEFORE YOU LEAVE.  YOU WILL USE ALL OF THEM AGAIN AFTER YOUR SURGERY.  [facility]

## 2015-06-16 ENCOUNTER — Encounter (HOSPITAL_COMMUNITY)
Admission: RE | Disposition: A | Discharge status: Home / Self Care | Payer: Self-pay | Source: Ambulatory Visit | Attending: Ophthalmology

## 2015-06-16 ENCOUNTER — Encounter (HOSPITAL_COMMUNITY): Payer: Self-pay | Admitting: *Deleted

## 2015-06-16 ENCOUNTER — Ambulatory Visit (HOSPITAL_COMMUNITY): Payer: Medicare Other | Admitting: Anesthesiology

## 2015-06-16 ENCOUNTER — Ambulatory Visit (HOSPITAL_COMMUNITY)
Admission: RE | Admit: 2015-06-16 | Discharge: 2015-06-16 | Disposition: A | Discharge status: Home / Self Care | Payer: Medicare Other | Source: Ambulatory Visit | Attending: Ophthalmology | Admitting: Ophthalmology

## 2015-06-16 DIAGNOSIS — Z01810 Encounter for preprocedural cardiovascular examination: Secondary | ICD-10-CM | POA: Diagnosis not present

## 2015-06-16 DIAGNOSIS — E119 Type 2 diabetes mellitus without complications: Secondary | ICD-10-CM | POA: Insufficient documentation

## 2015-06-16 DIAGNOSIS — I10 Essential (primary) hypertension: Secondary | ICD-10-CM | POA: Insufficient documentation

## 2015-06-16 DIAGNOSIS — Z01812 Encounter for preprocedural laboratory examination: Secondary | ICD-10-CM | POA: Insufficient documentation

## 2015-06-16 DIAGNOSIS — Z79899 Other long term (current) drug therapy: Secondary | ICD-10-CM | POA: Insufficient documentation

## 2015-06-16 DIAGNOSIS — H269 Unspecified cataract: Secondary | ICD-10-CM | POA: Diagnosis present

## 2015-06-16 HISTORY — PX: CATARACT EXTRACTION W/PHACO: SHX586

## 2015-06-16 LAB — POCT I-STAT 4, (NA,K, GLUC, HGB,HCT)
Glucose, Bld: 122 mg/dL — ABNORMAL HIGH (ref 65–99)
HEMATOCRIT: 32 % — AB (ref 39.0–52.0)
Hemoglobin: 10.9 g/dL — ABNORMAL LOW (ref 13.0–17.0)
Potassium: 5.3 mmol/L — ABNORMAL HIGH (ref 3.5–5.1)
Sodium: 135 mmol/L (ref 135–145)

## 2015-06-16 SURGERY — PHACOEMULSIFICATION, CATARACT, WITH IOL INSERTION
Anesthesia: Monitor Anesthesia Care | Laterality: Right

## 2015-06-16 MED ORDER — LIDOCAINE HCL 3.5 % OP GEL: 1.0000 "application " | Freq: Once | OPHTHALMIC | Status: DC

## 2015-06-16 MED ORDER — TETRACAINE HCL 0.5 % OP SOLN
1.0000 [drp] | OPHTHALMIC | Status: AC
  Administered 2015-06-16 (×3): 1 [drp] via OPHTHALMIC

## 2015-06-16 MED ORDER — FENTANYL CITRATE (PF) 100 MCG/2ML IJ SOLN
25.0000 ug | INTRAMUSCULAR | Status: AC
  Administered 2015-06-16 (×2): 25 ug via INTRAVENOUS

## 2015-06-16 MED ORDER — FENTANYL CITRATE (PF) 100 MCG/2ML IJ SOLN
INTRAMUSCULAR | Status: AC
  Filled 2015-06-16: qty 2

## 2015-06-16 MED ORDER — PHENYLEPHRINE-KETOROLAC 1-0.3 % IO SOLN
INTRAOCULAR | Status: DC | PRN
  Administered 2015-06-16: 500 mL via OPHTHALMIC

## 2015-06-16 MED ORDER — BSS IO SOLN
INTRAOCULAR | Status: DC | PRN
  Administered 2015-06-16: 15 mL

## 2015-06-16 MED ORDER — MIDAZOLAM HCL 2 MG/2ML IJ SOLN
INTRAMUSCULAR | Status: AC
  Filled 2015-06-16: qty 2

## 2015-06-16 MED ORDER — CARBACHOL 0.01 % IO SOLN
INTRAOCULAR | Status: DC | PRN
  Administered 2015-06-16: .6 mL via INTRAOCULAR

## 2015-06-16 MED ORDER — NA HYALUR & NA CHOND-NA HYALUR 0.55-0.5 ML IO KIT
PACK | INTRAOCULAR | Status: DC | PRN
  Administered 2015-06-16: 1 via OPHTHALMIC

## 2015-06-16 MED ORDER — LACTATED RINGERS IV SOLN: INTRAVENOUS | Status: DC | PRN

## 2015-06-16 MED ORDER — POVIDONE-IODINE 5 % OP SOLN
OPHTHALMIC | Status: DC | PRN
  Administered 2015-06-16: 1 via OPHTHALMIC

## 2015-06-16 MED ORDER — CYCLOPENTOLATE-PHENYLEPHRINE 0.2-1 % OP SOLN
1.0000 [drp] | OPHTHALMIC | Status: AC
  Administered 2015-06-16 (×3): 1 [drp] via OPHTHALMIC

## 2015-06-16 MED ORDER — SODIUM CHLORIDE 0.9 % IV SOLN
INTRAVENOUS | Status: DC
  Administered 2015-06-16: 08:00:00 via INTRAVENOUS

## 2015-06-16 MED ORDER — MIDAZOLAM HCL 2 MG/2ML IJ SOLN
1.0000 mg | INTRAMUSCULAR | Status: DC | PRN
Start: 2015-06-16 — End: 2015-06-16
  Administered 2015-06-16: 2 mg via INTRAVENOUS

## 2015-06-16 MED ORDER — LIDOCAINE HCL 3.5 % OP GEL
OPHTHALMIC | Status: DC | PRN
  Administered 2015-06-16: 1 via OPHTHALMIC

## 2015-06-16 MED ORDER — PHENYLEPHRINE-KETOROLAC 1-0.3 % IO SOLN
INTRAOCULAR | Status: AC
  Filled 2015-06-16: qty 4

## 2015-06-16 MED ORDER — CARBACHOL 0.01 % IO SOLN
INTRAOCULAR | Status: AC
  Filled 2015-06-16: qty 1.5

## 2015-06-16 MED ORDER — TETRACAINE 0.5 % OP SOLN OPTIME - NO CHARGE
OPHTHALMIC | Status: DC | PRN
  Administered 2015-06-16: 2 [drp] via OPHTHALMIC

## 2015-06-16 SURGICAL SUPPLY — 9 items
10-0 ETHILON SUTURE 7756G ×3 IMPLANT
CLOTH BEACON ORANGE TIMEOUT ST (SAFETY) ×3 IMPLANT
GLOVE BIOGEL PI IND STRL 7.0 (GLOVE) ×2 IMPLANT
GLOVE BIOGEL PI INDICATOR 7.0 (GLOVE) ×4
INST SET CATARACT ~~LOC~~ (KITS) ×3 IMPLANT
KIT VITRECTOMY (OPHTHALMIC RELATED) IMPLANT
LENS ALC ACRYL/TECN (Ophthalmic Related) ×3 IMPLANT
PAD ARMBOARD 7.5X6 YLW CONV (MISCELLANEOUS) ×3 IMPLANT
WATER STERILE IRR 250ML POUR (IV SOLUTION) ×3 IMPLANT

## 2015-06-16 NOTE — Anesthesia Preprocedure Evaluation (Addendum)
Anesthesia Evaluation  Patient identified by MRN, date of birth, ID band Patient awake    Reviewed: Allergy & Precautions, NPO status , Patient's Chart, lab work & pertinent test results  Airway Mallampati: II  TM Distance: >3 FB     Dental  (+) Edentulous Upper, Poor Dentition   Pulmonary neg pulmonary ROS,    breath sounds clear to auscultation       Cardiovascular hypertension, Pt. on medications  Rhythm:Regular Rate:Normal     Neuro/Psych CVA    GI/Hepatic GERD  ,  Endo/Other  diabetes, Type 2, Oral Hypoglycemic Agents  Renal/GU ESRFRenal disease     Musculoskeletal   Abdominal   Peds  Hematology   Anesthesia Other Findings   Reproductive/Obstetrics                           Anesthesia Physical Anesthesia Plan  ASA: IV  Anesthesia Plan: MAC   Post-op Pain Management:    Induction: Intravenous  Airway Management Planned: Nasal Cannula  Additional Equipment:   Intra-op Plan:   Post-operative Plan:   Informed Consent: I have reviewed the patients History and Physical, chart, labs and discussed the procedure including the risks, benefits and alternatives for the proposed anesthesia with the patient or authorized representative who has indicated his/her understanding and acceptance.     Plan Discussed with:   Anesthesia Plan Comments:         Anesthesia Quick Evaluation

## 2015-06-16 NOTE — Op Note (Signed)
06/16/2015  10:19 AM  PATIENT:  Osvaldo Angst  59 y.o. male  PRE-OPERATIVE DIAGNOSIS:  surgical cataract right eye  POST-OPERATIVE DIAGNOSIS:  surgical cataract right eye; anterior capsular tear with posterior extension without vitreous loss  PROCEDURE:  Procedure(s): CATARACT EXTRACTION PHACO AND INTRAOCULAR LENS PLACEMENT RIGHT EYE CDE=54.69  SURGEON:  Surgeon(s): Williams Che, MD  ASSISTANTS: Bonney Roussel, CST   ANESTHESIA STAFF: Anesthesiologist: Lerry Liner, MD CRNA: Mickel Baas, CRNA  ANESTHESIA:   topical and MAC  REQUESTED LENS POWER: 67.6, due to complication, the alternate anterior chamber lens was used   --  18.0  LENS IMPLANT INFORMATION:  Alcon MTA 4UO   18.0 D    S/n  [id number]  Exp  08/2019  CUMULATIVE DISSIPATED ENERGY:54.68  INDICATIONS:see office H&P for specific details  OP FINDINGS:dense NS, small pupil, anterior capsular tear with posterior extension and intact vitreous face. and no vitreous presentation .  COMPLICATIONS: Cap Tear without vitreous loss requiring anterior chamber lens placement without vitrectomy  PROCEDURE:  The patient was brought to the operating room in good condition.  The operative eye was prepped and draped in the usual fashion for intraocular surgery.  Lidocaine gel was dropped onto the eye.  A 2.4 mm 10 O'clock near clear corneal stepped incision and a 12 O'clock stab incision were created.  Viscoat was instilled into the anterior chamber.  The 5 mm anterior capsulorhexis was performed with a bent needle cystotome and Utrata forceps.  The lens was hydrodissected and hydrodelineated with a cannula and balanced salt solution and rotated with a Kuglen hook.  Phacoemulsification was perfomed in the divide and conquer technique.  The remaining cortex was removed with I&A and the capsular surfaces polished as necessary. It was noted that the posterior capsule was open and that the anterior vitreous was intact.  The anterior capsule  was inspected and a tear was noted.  Viscoat was placed into the bag to keep the vitreous face in place.  The wound was extended with the scissors to the left, miostat instilled and a lens glide placed.  A iridectomy was performed and the lens placed.  Three interrupted 10-0 nylon corneal sutures were placed.  The eye was refilled with balanced salt solution and the wound tested for leaks, none being found the sutures were trimmed.  The lid speculum and drapes were removed and the patient was transported to short stay in good condition.  PLAN OF CARE: as above  PATIENT DISPOSITION:  Short Stay

## 2015-06-16 NOTE — H&P (Signed)
I have reviewed the pre printed H&P, the patient was re-examined, and I have identified no significant interval changes in the patient's medical condition.  There is no change in the plan of care since the history and physical of record. 

## 2015-06-16 NOTE — Discharge Instructions (Signed)

## 2015-06-16 NOTE — Brief Op Note (Signed)
06/16/2015  10:19 AM  PATIENT:  Nathaniel Johnston  59 y.o. male  PRE-OPERATIVE DIAGNOSIS:  surgical cataract right eye  POST-OPERATIVE DIAGNOSIS:  surgical cataract right eye; anterior capsular tear with posterior extension without vitreous loss  PROCEDURE:  Procedure(s): CATARACT EXTRACTION PHACO AND INTRAOCULAR LENS PLACEMENT RIGHT EYE CDE=54.69  SURGEON:  Surgeon(s): Williams Che, MD  ASSISTANTS: Bonney Roussel, CST   ANESTHESIA STAFF: Anesthesiologist: Lerry Liner, MD CRNA: Mickel Baas, CRNA  ANESTHESIA:   topical and MAC  REQUESTED LENS POWER: 29.9, due to complication, the alternate anterior chamber lens was used   --  18.0  LENS IMPLANT INFORMATION:  Alcon MTA 4UO   18.0 D    S/n  [id number]  Exp  08/2019  CUMULATIVE DISSIPATED ENERGY:54.68  INDICATIONS:see office H&P for specific details  OP FINDINGS:dense NS, small pupil, anterior capsular tear with posterior extension and intact vitreous face. and no vitreous presentation .  COMPLICATIONS: Cap Tear without vitreous loss requiring anterior chamber lens placement without vitrectomy  DICTATION #: none  PLAN OF CARE: as above  PATIENT DISPOSITION:  Short Stay

## 2015-06-16 NOTE — Anesthesia Procedure Notes (Signed)
Procedure Name: MAC Date/Time: 06/16/2015 8:49 AM Performed by: Andree Elk, AMY A Pre-anesthesia Checklist: Patient identified, Timeout performed, Emergency Drugs available, Suction available and Patient being monitored Oxygen Delivery Method: Nasal cannula

## 2015-06-16 NOTE — Anesthesia Postprocedure Evaluation (Signed)
  Anesthesia Post-op Note  Patient: Nathaniel Johnston  Procedure(s) Performed: Procedure(s): CATARACT EXTRACTION PHACO AND INTRAOCULAR LENS PLACEMENT RIGHT EYE CDE=54.69 (Right)  Patient Location: Short Stay  Anesthesia Type:MAC  Level of Consciousness: awake, alert , oriented and patient cooperative  Airway and Oxygen Therapy: Patient Spontanous Breathing  Post-op Pain: none  Post-op Assessment: Post-op Vital signs reviewed, Patient's Cardiovascular Status Stable, Respiratory Function Stable, Patent Airway, No signs of Nausea or vomiting and Pain level controlled              Post-op Vital Signs: Reviewed and stable  Last Vitals:  Filed Vitals:   06/16/15 0845  BP: 136/61  Pulse:   Temp:   Resp: 13    Complications: No apparent anesthesia complications

## 2015-06-16 NOTE — Transfer of Care (Signed)
Immediate Anesthesia Transfer of Care Note  Patient: Nathaniel Johnston  Procedure(s) Performed: Procedure(s): CATARACT EXTRACTION PHACO AND INTRAOCULAR LENS PLACEMENT RIGHT EYE CDE=54.69 (Right)  Patient Location: Short Stay  Anesthesia Type:MAC  Level of Consciousness: awake, alert , oriented and patient cooperative  Airway & Oxygen Therapy: Patient Spontanous Breathing  Post-op Assessment: Report given to RN and Post -op Vital signs reviewed and stable  Post vital signs: Reviewed and stable  Last Vitals:  Filed Vitals:   06/16/15 0845  BP: 136/61  Pulse:   Temp:   Resp: 13    Complications: No apparent anesthesia complications

## 2015-06-17 ENCOUNTER — Encounter (HOSPITAL_COMMUNITY): Payer: Self-pay | Admitting: Ophthalmology

## 2015-07-14 ENCOUNTER — Ambulatory Visit (HOSPITAL_COMMUNITY)
Admission: RE | Admit: 2015-07-14 | Discharge: 2015-07-14 | Disposition: A | Discharge status: Home / Self Care | Payer: Medicare Other | Source: Ambulatory Visit | Attending: Ophthalmology | Admitting: Ophthalmology

## 2015-07-14 ENCOUNTER — Encounter (HOSPITAL_COMMUNITY): Payer: Self-pay | Admitting: *Deleted

## 2015-07-14 ENCOUNTER — Encounter (HOSPITAL_COMMUNITY)
Admission: RE | Disposition: A | Discharge status: Home / Self Care | Payer: Self-pay | Source: Ambulatory Visit | Attending: Ophthalmology

## 2015-07-14 DIAGNOSIS — H5989 Other postprocedural complications and disorders of eye and adnexa, not elsewhere classified: Secondary | ICD-10-CM | POA: Insufficient documentation

## 2015-07-14 DIAGNOSIS — Z538 Procedure and treatment not carried out for other reasons: Secondary | ICD-10-CM | POA: Insufficient documentation

## 2015-07-14 SURGERY — TREATMENT, USING YAG LASER
Anesthesia: LOCAL | Laterality: Right

## 2015-07-14 MED ORDER — TETRACAINE HCL 0.5 % OP SOLN
1.0000 [drp] | Freq: Once | OPHTHALMIC | Status: AC
  Administered 2015-07-14: 1 [drp] via OPHTHALMIC

## 2015-07-14 MED ORDER — TROPICAMIDE 1 % OP SOLN
OPHTHALMIC | Status: AC
  Filled 2015-07-14: qty 3

## 2015-07-14 MED ORDER — TETRACAINE HCL 0.5 % OP SOLN
OPHTHALMIC | Status: AC
  Filled 2015-07-14: qty 2

## 2015-07-14 MED ORDER — TROPICAMIDE 1 % OP SOLN
1.0000 [drp] | OPHTHALMIC | Status: AC
  Administered 2015-07-14 (×3): 1 [drp] via OPHTHALMIC

## 2015-07-14 NOTE — Brief Op Note (Signed)
Procedure cancelled due to laser malfunction

## 2015-07-14 NOTE — Progress Notes (Signed)
Equipment error procedure not done. Equipment to be repaired and patient to reschedule.

## 2015-07-14 NOTE — Discharge Instructions (Signed)
Nathaniel Johnston  07/14/2015     Instructions    Activity: No Restrictions.   Diet: Resume Diet you were on at home.   Pain Medication: Tylenol if Needed.   CONTACT YOUR DOCTOR IF YOU HAVE PAIN, REDNESS IN YOUR EYE, OR DECREASED VISION.   Follow-up:                          with Williams Che, MD.     Dr. Iona Hansen: [phone]        If you find that you cannot contact your physician, but feel that your signs and   Symptoms warrant a physician's attention, call the Emergency Room at   [phone] ext.[phone].

## 2015-07-23 ENCOUNTER — Encounter (HOSPITAL_COMMUNITY)
Admission: RE | Disposition: A | Discharge status: Home / Self Care | Payer: Self-pay | Source: Ambulatory Visit | Attending: Ophthalmology

## 2015-07-23 ENCOUNTER — Ambulatory Visit (HOSPITAL_COMMUNITY)
Admission: RE | Admit: 2015-07-23 | Discharge: 2015-07-23 | Disposition: A | Discharge status: Home / Self Care | Payer: Medicare Other | Source: Ambulatory Visit | Attending: Ophthalmology | Admitting: Ophthalmology

## 2015-07-23 DIAGNOSIS — Z7984 Long term (current) use of oral hypoglycemic drugs: Secondary | ICD-10-CM | POA: Diagnosis not present

## 2015-07-23 DIAGNOSIS — I10 Essential (primary) hypertension: Secondary | ICD-10-CM | POA: Insufficient documentation

## 2015-07-23 DIAGNOSIS — Z79899 Other long term (current) drug therapy: Secondary | ICD-10-CM | POA: Diagnosis not present

## 2015-07-23 DIAGNOSIS — E119 Type 2 diabetes mellitus without complications: Secondary | ICD-10-CM | POA: Insufficient documentation

## 2015-07-23 DIAGNOSIS — H5989 Other postprocedural complications and disorders of eye and adnexa, not elsewhere classified: Secondary | ICD-10-CM | POA: Insufficient documentation

## 2015-07-23 HISTORY — PX: YAG LASER APPLICATION: SHX6189

## 2015-07-23 SURGERY — TREATMENT, USING YAG LASER
Anesthesia: LOCAL | Laterality: Right

## 2015-07-23 MED ORDER — TROPICAMIDE 1 % OP SOLN
1.0000 [drp] | OPHTHALMIC | Status: AC
  Administered 2015-07-23 (×2): 1 [drp] via OPHTHALMIC

## 2015-07-23 MED ORDER — TETRACAINE HCL 0.5 % OP SOLN
1.0000 [drp] | Freq: Once | OPHTHALMIC | Status: AC
  Administered 2015-07-23: 1 [drp] via OPHTHALMIC

## 2015-07-23 MED ORDER — TROPICAMIDE 1 % OP SOLN
OPHTHALMIC | Status: AC
  Filled 2015-07-23: qty 3

## 2015-07-23 MED ORDER — TETRACAINE HCL 0.5 % OP SOLN
OPHTHALMIC | Status: AC
  Filled 2015-07-23: qty 2

## 2015-07-23 NOTE — H&P (Signed)
I have reviewed the pre printed H&P, the patient was re-examined, and I have identified no significant interval changes in the patient's medical condition.  There is no change in the plan of care since the history and physical of record. 

## 2015-07-23 NOTE — Progress Notes (Addendum)
1250 Time out successfully completed with Dr. Iona Hansen. Correct patient, Correct procedure, Correct site, and Allergies confirmed before beginning procedure. Patient stable and VSS at this time. Fredderick Phenix. Equan Cogbill,RN

## 2015-07-23 NOTE — Progress Notes (Signed)
[place] Dr. Iona Hansen office for appointment in 2 weeks-August 06, 2015 at 10:15. Patient told upon discharge to call Dr. Iona Hansen office for pain or any vision disturbances. Patient told to keep dirt out of eye.  Patient verbalized understanding.Patient also given Dr. Iona Hansen' office number if needed during business hours or hospital number to use to contact Dr. Iona Hansen after hours. Verbalized understanding.

## 2015-07-23 NOTE — Brief Op Note (Signed)
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    Indications:  Pupillary block with balooned iris due to closed surgical PI  Procedure: YAG PI OD    Eye protection worn by staff:  Yes.   Laser In Use sign on door:  Yes.    Laser:  {LUMENIS SELECTA DUETYAG/SLT LASER:  Power Setting:  3.0 mJ/burst Anatomical site treated:  Iris previous PI site Number of applications:  1 Total energy delivered: 2.82 mJ Results:  Open PI with good flow through it and release of ballooned iris  The patient was discharged home with instructions to continue all his current glaucoma medications in the un-operated eye, and discontinue all his current glaucoma medications, if any.  Patient was instructed to go to the office, as previously scheduled, for intraocular pressure:  Yes.    Patient verbalizes understanding of discharge instructions:  Yes.    Notes:  Pt tolerated procedure well, no complications, good flow through PI noted.  balooned iris reduced somewhat.   Post procedure IOP 18 mm Hg by tonopen.

## 2015-07-25 ENCOUNTER — Encounter (HOSPITAL_COMMUNITY): Payer: Self-pay | Admitting: Ophthalmology

## 2015-08-05 ENCOUNTER — Other Ambulatory Visit (HOSPITAL_COMMUNITY): Payer: Self-pay | Admitting: Oncology

## 2015-08-05 DIAGNOSIS — C189 Malignant neoplasm of colon, unspecified: Secondary | ICD-10-CM

## 2015-08-05 DIAGNOSIS — C772 Secondary and unspecified malignant neoplasm of intra-abdominal lymph nodes: Secondary | ICD-10-CM

## 2015-08-13 ENCOUNTER — Encounter (HOSPITAL_COMMUNITY)
Admission: RE | Admit: 2015-08-13 | Discharge: 2015-08-13 | Disposition: A | Discharge status: Home / Self Care | Payer: Medicare Other | Source: Ambulatory Visit | Attending: Oncology | Admitting: Oncology

## 2015-08-13 DIAGNOSIS — C189 Malignant neoplasm of colon, unspecified: Secondary | ICD-10-CM | POA: Insufficient documentation

## 2015-08-13 DIAGNOSIS — C772 Secondary and unspecified malignant neoplasm of intra-abdominal lymph nodes: Secondary | ICD-10-CM | POA: Diagnosis present

## 2015-08-13 LAB — GLUCOSE, CAPILLARY: GLUCOSE-CAPILLARY: 105 mg/dL — AB (ref 65–99)

## 2015-08-13 MED ORDER — FLUDEOXYGLUCOSE F - 18 (FDG) INJECTION
13.7000 | Freq: Once | INTRAVENOUS | Status: DC | PRN
Start: 2015-08-13 — End: 2015-08-19
  Administered 2015-08-13: 13.7 via INTRAVENOUS
  Filled 2015-08-13: qty 13.7

## 2016-03-04 ENCOUNTER — Other Ambulatory Visit (HOSPITAL_COMMUNITY): Payer: Self-pay | Admitting: Oncology

## 2016-03-08 ENCOUNTER — Other Ambulatory Visit (HOSPITAL_COMMUNITY): Payer: Self-pay | Admitting: Oncology

## 2016-03-08 ENCOUNTER — Encounter (HOSPITAL_COMMUNITY): Payer: Self-pay | Admitting: Oncology

## 2016-03-08 DIAGNOSIS — C180 Malignant neoplasm of cecum: Secondary | ICD-10-CM

## 2016-03-08 HISTORY — DX: Malignant neoplasm of cecum: C18.0

## 2016-03-11 ENCOUNTER — Encounter (HOSPITAL_COMMUNITY): Payer: Medicare Other | Attending: Oncology | Admitting: Oncology

## 2016-03-11 ENCOUNTER — Encounter (HOSPITAL_COMMUNITY): Payer: Self-pay | Admitting: Oncology

## 2016-03-11 VITALS — BP 147/72 | HR 74 | Temp 98.6°F | Resp 20 | Ht 71.0 in | Wt 259.0 lb

## 2016-03-11 DIAGNOSIS — Z8673 Personal history of transient ischemic attack (TIA), and cerebral infarction without residual deficits: Secondary | ICD-10-CM

## 2016-03-11 DIAGNOSIS — Z66 Do not resuscitate: Secondary | ICD-10-CM | POA: Diagnosis not present

## 2016-03-11 DIAGNOSIS — C180 Malignant neoplasm of cecum: Secondary | ICD-10-CM | POA: Diagnosis not present

## 2016-03-11 DIAGNOSIS — N186 End stage renal disease: Secondary | ICD-10-CM

## 2016-03-11 DIAGNOSIS — Z992 Dependence on renal dialysis: Secondary | ICD-10-CM | POA: Diagnosis not present

## 2016-03-11 HISTORY — DX: End stage renal disease: N18.6

## 2016-03-11 HISTORY — DX: Do not resuscitate: Z66

## 2016-03-11 NOTE — Progress Notes (Signed)
[facility] Hematology/Oncology Consultation   Name: Nathaniel Johnston      MRN: [id number]     Date: 03/11/2016 Time:7:02 PM   REFERRING PHYSICIAN:  Everardo All, MD ([place])  REASON FOR CONSULT:  Transfer of medical oncology care.   DIAGNOSIS:  Stage IV adenocarcinoma of colon  HISTORY OF PRESENT ILLNESS:   Nathaniel Johnston is a 60 y.o. 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    Adenocarcinoma of cecum ([place])   06/19/2014 Pathology Results Biopsy of right colon mass positive for adenocarcinoma. He had multiple other tubular adenomas and an occasional tubulovillous adenomas seen. Cancer is K-ras mutation negative (wild type).   07/19/2014 Procedure Colonoscopy   07/19/2014 Pathology Results Right colon mass- invasive adenocarcinoma   07/25/2014 Surgery Right hemicolectomy performed by Dr. Ladona Horns   07/25/2014 Pathology Results mucinous adenocarcinoma, 6 cm in maximum dimension, arising from the cecum. positive for 19/25 nodes, +LVI, focal neural invasion, poorly differentiated with numerous extramural nodules, greater than 10   08/10/2014 Pathology Results KRAS/NRAS mutation in negative.  No KRAS or NRAS mutations were detected in exon 2, 3, and 4.   08/29/2014 - 02/20/2015 Chemotherapy FOLFOX adjusted for renal failure and on dialysis, 12 cycles   04/02/2015 Imaging CT abdomen pelvis-interval resolution of previously described anterior abdominal wall abscess. Ventral abdominal wall wound is predominantly healed. Small fat containing ventral abdominal wall hernia. NED.   08/18/2015 Progression    08/18/2015 PET scan Prominent recurrence with extensive retroperitoneal hypermetabolic adenopathy, extensive adenopathy and nodularity in the  right perirenal space and tracking in the right peritoneum. Hypermetabolic mass along the bowel anastomotic site.    09/04/2015 -  Chemotherapy FOLFOX chemotherapy reinitiated for recurrent disease.   11/28/2015 Imaging CT abdomen and pelvis-persistent nodularity along the right pararenal space, reduction in retroperitoneal lymphadenopathy, persistent enlarged retroperitoneal lymph node adjacent to the right kidney, no evidence of disease progression or liver metastases.   03/11/2016 Miscellaneous Transfer of medical oncology care to [facility] (from [facility])   03/11/2016 Code Status DNR    Chart is reviewed in detail.  In summary, the patient was diagnosed with a Stage IIIC  In 2015.  He was treated definitively with surgery followed by adjuvant chemotherapy consisting of FOLFOX x 6 months.  Unfortunately, he had high-risk disease and recurrence was noted on PET imaging on 08/13/2015 when his CEA climbed.  He was therefore rechallenged with FOLFOX and has done well since.  He has been on FOLFOX since 09/04/2015 and repeat CT scans in March 2017 demonstrate improvement in disease burden.  The patient notes that he is tolerating therapy well. As a result, we will continue with the same doses.  He denies any complaints today.  He reports that dialysis is going well.  We discussed his Stage IV disease.  He notes that he was unaware of his stage.  We discussed the incurability of his disease, but it is treatable and evidence of response has been proved with CT imaging in March 2017.  We discussed CODE STATUS.  In no confusing manner, he admits that he wants to be a DNR.    Review of Systems  Constitutional: Negative for fever, chills and weight loss.  HENT:  Negative for sore throat.   Eyes: Negative for blurred vision and double vision.  Respiratory: Negative for cough, hemoptysis, sputum production, shortness of breath and wheezing.   Cardiovascular: Positive for leg  swelling. Negative for chest pain and palpitations.  Gastrointestinal: Positive for diarrhea. Negative for nausea, vomiting, abdominal pain, constipation, blood in stool and melena.  Genitourinary: Negative for dysuria, urgency, frequency and hematuria.  Musculoskeletal: Negative for myalgias and falls.  Skin: Negative for itching and rash.  Neurological: Negative for dizziness and headaches.  Endo/Heme/Allergies: Does not bruise/bleed easily.  Psychiatric/Behavioral: Negative.   14 point review of systems was performed and is negative except as detailed under history of present illness and above    PAST MEDICAL HISTORY:   Past Medical History  Diagnosis Date  . High cholesterol   . Hypertension   . Diabetes mellitus without complication ([place])   . Stroke [facility])     2003  . GERD (gastroesophageal reflux disease)   . Cancer ([place]) 07/2014    colon cancer surgery.  Finished Chemo 02/2015  . Chronic kidney disease   . Adenocarcinoma of cecum ([place]) 03/08/2016  . DNR (do not resuscitate) 03/11/2016  . ESRD (end stage renal disease) ([place]) 03/11/2016    ALLERGIES: No Known Allergies    MEDICATIONS: I have reviewed the patient's current medications.    Current Outpatient Prescriptions on File Prior to Visit  Medication Sig Dispense Refill  . amLODipine (NORVASC) 10 MG tablet Take 2 tablets by mouth.    . calcium acetate (PHOSLO) 667 MG capsule Take 1 capsule by mouth.    Marland Kitchen glipiZIDE (GLUCOTROL) 10 MG tablet Take 1 tablet by mouth.    . Iron Polysacch Cmplx-B12-FA 150-0.025-1 MG CAPS Take 1 capsule by mouth.    . labetalol (NORMODYNE) 300 MG tablet Take 2 tablets by mouth.    Marland Kitchen lisinopril (PRINIVIL,ZESTRIL) 40 MG tablet Take 1 tablet by mouth.    . loperamide (IMODIUM) 2 MG capsule Take 2 mg by mouth.    . sevelamer carbonate (RENVELA) 800 MG tablet Take 1 tablet by mouth.    . simvastatin (ZOCOR) 80 MG tablet Take 1 tablet by mouth.    . Skin Protectants, Misc. (EUCERIN) cream Apply  topically.    . triamcinolone cream (KENALOG) 0.1 % Apply topically.     No current facility-administered medications on file prior to visit.     PAST SURGICAL HISTORY Past Surgical History  Procedure Laterality Date  . Dialysis fistula creation Right   . Colectomy  07/2014  . Toe amputation Right 2000  . Amputation toe Left 2001  . Cataract extraction w/phaco Right 06/16/2015    Procedure: CATARACT EXTRACTION PHACO AND INTRAOCULAR LENS PLACEMENT RIGHT EYE CDE=54.69;  Surgeon: Williams Che, MD;  Location: [facility];  Service: Ophthalmology;  Laterality: Right;  . Yag laser application Right 77/82/4235    Procedure: YAG LASER APPLICATION;  Surgeon: Williams Che, MD;  Location: [facility];  Service: Ophthalmology;  Laterality: Right;    FAMILY HISTORY: No family history on file.  He has a brother who lives nearby. He also has a sister His mother is living.  Her health is "good"  She is 22 yo Father is deceased in his 50's from unknown reason. He has a daughter and 2 grandbabies.  They live in [place] as she is married to a Nature conservation officer man.  SOCIAL HISTORY:  reports that he quit smoking about 25 years ago. His smoking use included Cigarettes. He has a 7.5  pack-year smoking history. He does not have any smokeless tobacco history on file. He reports that he drinks about 3.0 oz of alcohol per week. He reports that he does not use illicit drugs. He is a Engineer, manufacturing.  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  He retired with disability from 2 work injuries leading to bilateral amputation of #1 toe.  He ascertained disability in 2003.  Social History   Social History  . Marital Status: Married    Spouse Name: N/A  . Number of Children: N/A  . Years of Education: N/A   Occupational History  . Metal [employer]     [employer]   Social History Main Topics  . Smoking status: Former Smoker -- 0.50 packs/day for 15 years    Types: Cigarettes    Quit date:  03/12/1991  . Smokeless tobacco: Not on file  . Alcohol Use: 3.0 oz/week    5 Shots of liquor per week  . Drug Use: No  . Sexual Activity: Not on file   Other Topics Concern  . Not on file   Social History Narrative    PERFORMANCE STATUS: The patient's performance status is 2 - Symptomatic, <50% confined to bed  PHYSICAL EXAM: Most Recent Vital Signs: Blood pressure 147/72, pulse 74, temperature 98.6 F (37 C), temperature source Oral, resp. rate 20, height 5' 11"  (1.803 m), weight 259 lb (117.482 kg), SpO2 100 %. General appearance: alert, appears older than stated age, no distress, moderately obese and in wheelchair, accompanied by a friend who assists with transportation Head: Normocephalic, without obvious abnormality, atraumatic Throat: normal findings: oropharynx pink & moist without lesions or evidence of thrush Neck: supple, symmetrical, trachea midline Lungs: clear to auscultation bilaterally and normal percussion bilaterally Heart: regular rate and rhythm Extremities: edema B/L 1+ pitting edema pre-tibially without erythema or heat.  No tenderness to palpation.  Socks carry his cell phone and wallet. Skin: Skin color, texture, turgor normal. No rashes or lesions Neurologic: Grossly normal  LABORATORY DATA:  No results found for this or any previous visit (from the past 48 hour(s)).            RADIOGRAPHY: No results found.     PATHOLOGY:  N/A   ASSESSMENT/PLAN:   Adenocarcinoma of cecum ([place]), Stage IV ESRD on Dialysis  Stage IV adenocarcinoma of colon found on PET imaging on 08/13/2015 after being diagnosed with Stage IIIC (T3N2BM0) on 07/25/2014 treated with definitive surgery followed by 6 months of adjuvant therapy consisting of FOLFOX. Patient was restarted on FOLFOX with dosing according to dialysis requirements. Last CEA available was from December. CEA will be rechecked today. He will also need to be restaged.   We will need to consider other  options moving forward, he is KRAS,NRAS WT. Microsatellite status is unknown.   Oncology history developed.  Staging in [facility] problem list completed.  DNR.  DNR form is completed and given to the patient.  Labs today (at the patient's request): CBC diff, CMET, CEA.  Return next week for chemotherapy at the following doses:  Oxaliplatin- 50 mg/m2  Leucovorin- 400 mg/m2  5FU Inj- 300 mg/m2  5FU CI- 1500 mg/m2 over 22-24 hours He will return on day 2 for pump D/C.  CT CAP with contrast in ~ 3 week for restaging purposes.  Return next week for treatment.  Return in 3 weeks for next treatment and follow-up appointment.   ORDERS PLACED FOR THIS ENCOUNTER: Orders Placed This Encounter  Procedures  . CT Abdomen Pelvis W Contrast  . CT Chest W Contrast  . CBC with Differential  . Comprehensive metabolic panel  . CEA  . CBC with Differential  . Comprehensive metabolic panel  . CEA  . DNR (Do Not Resuscitate)    All questions were answered. The patient knows to call the clinic with any problems, questions or concerns. We can certainly see the patient much sooner if necessary.  This note is electronically signed by: Molli Hazard, MD  03/11/2016 7:02 PM

## 2016-03-11 NOTE — Patient Instructions (Signed)
[place] at [facility] Discharge Instructions  RECOMMENDATIONS MADE BY THE CONSULTANT AND ANY TEST RESULTS WILL BE SENT TO YOUR REFERRING PHYSICIAN.  We will perform labs today. You will return next week for treatment as planned. We will get you set-up for CT scans in ~ 2 weeks. We will see you back in ~ 4 weeks for follow-up appointment and treatment.  Thank you for choosing [place] at [facility] to provide your oncology and hematology care.  To afford each patient quality time with our provider, please arrive at least 15 minutes before your scheduled appointment time.   Beginning January 23rd 2017 lab work for the [name] will be done in the  Main lab at [facility] on 1st floor. If you have a lab appointment with the [facility] please come in thru the  Main Entrance and check in at the main information desk  You need to re-schedule your appointment should you arrive 10 or more minutes late.  We strive to give you quality time with our providers, and arriving late affects you and other patients whose appointments are after yours.  Also, if you no show three or more times for appointments you may be dismissed from the clinic at the providers discretion.     Again, thank you for choosing [facility].  Our hope is that these requests will decrease the amount of time that you wait before being seen by our physicians.       _____________________________________________________________  Should you have questions after your visit to [facility] please contact our office at [phone] between the hours of 8:30 a.m. and 4:30 p.m.  Voicemails left after 4:30 p.m. will not be returned until the following business day.  For prescription refill requests, have your pharmacy contact our office.         Resources For Cancer Patients and their Caregivers ? American Cancer Society: Can assist with  transportation, wigs, general needs, runs Look Good Feel Better.        929-315-7364 ? Cancer Care: Provides financial assistance, online support groups, medication/co-pay assistance.  1-800-813-HOPE 562-286-4043) ? [place] Assists [place] cancer patients and their families through emotional , educational and financial support.  670-059-1317 ? [name] DSS Where to apply for food stamps, Medicaid and utility assistance. (508)297-4137 ? [name]: Transportation to medical appointments. 417-747-5744 ? Social Security Administration: May apply for disability if have a Stage IV cancer. 8471304040 808-662-3046 ? [employer], Disability and Transit Services: Assists with nutrition, care and transit needs. [place] Support Programs: @10RELATIVEDAYS @ > Cancer Support Group  2nd Tuesday of the month 1pm-2pm, [name]  > Creative Journey  3rd Tuesday of the month 1130am-1pm, [name]  > Look Good Feel Better  1st Wednesday of the month 10am-12 noon, [name] (Call [place] to register [phone])

## 2016-03-11 NOTE — Assessment & Plan Note (Addendum)
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  Oncology history developed.  Staging in [facility] problem list completed.  DNR.  DNR form is completed and given to the patient.  Labs today (at the patient's request): CBC diff, CMET, CEA.  Return next week for chemotherapy at the following doses:  Oxaliplatin- 50 mg/m2  Leucovorin- 400 mg/m2  5FU Inj- 300 mg/m2  5FU CI- 1500 mg/m2 over 22-24 hours He will return on day 2 for pump D/C.  CT CAP with contrast in ~ 3 week for restaging purposes.  Return next week for treatment.  Return in 3 weeks for next treatment and follow-up appointment.

## 2016-03-16 ENCOUNTER — Encounter (HOSPITAL_BASED_OUTPATIENT_CLINIC_OR_DEPARTMENT_OTHER): Payer: Medicare Other

## 2016-03-16 ENCOUNTER — Encounter (HOSPITAL_COMMUNITY): Payer: Self-pay

## 2016-03-16 ENCOUNTER — Inpatient Hospital Stay (HOSPITAL_COMMUNITY): Payer: Medicare Other

## 2016-03-16 ENCOUNTER — Encounter: Payer: Self-pay | Admitting: *Deleted

## 2016-03-16 VITALS — BP 146/77 | HR 87 | Temp 97.9°F | Resp 20 | Wt 257.2 lb

## 2016-03-16 DIAGNOSIS — C189 Malignant neoplasm of colon, unspecified: Secondary | ICD-10-CM | POA: Diagnosis not present

## 2016-03-16 DIAGNOSIS — C180 Malignant neoplasm of cecum: Secondary | ICD-10-CM

## 2016-03-16 DIAGNOSIS — Z5111 Encounter for antineoplastic chemotherapy: Secondary | ICD-10-CM

## 2016-03-16 LAB — COMPREHENSIVE METABOLIC PANEL
ALT: 15 U/L — AB (ref 17–63)
AST: 17 U/L (ref 15–41)
Albumin: 3.8 g/dL (ref 3.5–5.0)
Alkaline Phosphatase: 61 U/L (ref 38–126)
Anion gap: 9 (ref 5–15)
BILIRUBIN TOTAL: 0.6 mg/dL (ref 0.3–1.2)
BUN: 28 mg/dL — AB (ref 6–20)
CHLORIDE: 101 mmol/L (ref 101–111)
CO2: 29 mmol/L (ref 22–32)
CREATININE: 5.99 mg/dL — AB (ref 0.61–1.24)
Calcium: 8.2 mg/dL — ABNORMAL LOW (ref 8.9–10.3)
GFR calc Af Amer: 11 mL/min — ABNORMAL LOW (ref >60)
GFR, EST NON AFRICAN AMERICAN: 9 mL/min — AB (ref >60)
Glucose, Bld: 64 mg/dL — ABNORMAL LOW (ref 65–99)
Potassium: 3.7 mmol/L (ref 3.5–5.1)
Sodium: 139 mmol/L (ref 135–145)
Total Protein: 7 g/dL (ref 6.5–8.1)

## 2016-03-16 LAB — CBC WITH DIFFERENTIAL/PLATELET
BASOS ABS: 0 10*3/uL (ref 0.0–0.1)
BASOS PCT: 0 %
EOS PCT: 4 %
Eosinophils Absolute: 0.2 10*3/uL (ref 0.0–0.7)
HCT: 28.6 % — ABNORMAL LOW (ref 39.0–52.0)
Hemoglobin: 9.4 g/dL — ABNORMAL LOW (ref 13.0–17.0)
Lymphocytes Relative: 19 %
Lymphs Abs: 1 10*3/uL (ref 0.7–4.0)
MCH: 32.9 pg (ref 26.0–34.0)
MCHC: 32.9 g/dL (ref 30.0–36.0)
MCV: 100 fL (ref 78.0–100.0)
MONO ABS: 0.6 10*3/uL (ref 0.1–1.0)
Monocytes Relative: 12 %
Neutro Abs: 3.4 10*3/uL (ref 1.7–7.7)
Neutrophils Relative %: 65 %
PLATELETS: 177 10*3/uL (ref 150–400)
RBC: 2.86 MIL/uL — ABNORMAL LOW (ref 4.22–5.81)
RDW: 16 % — AB (ref 11.5–15.5)
WBC: 5.4 10*3/uL (ref 4.0–10.5)

## 2016-03-16 MED ORDER — SODIUM CHLORIDE 0.9% FLUSH: 10.0000 mL | INTRAVENOUS | Status: DC | PRN

## 2016-03-16 MED ORDER — FLUOROURACIL CHEMO INJECTION 2.5 GM/50ML
300.0000 mg/m2 | Freq: Once | INTRAVENOUS | Status: AC
  Administered 2016-03-16: 750 mg via INTRAVENOUS
  Filled 2016-03-16: qty 15

## 2016-03-16 MED ORDER — PALONOSETRON HCL INJECTION 0.25 MG/5ML
0.2500 mg | Freq: Once | INTRAVENOUS | Status: AC
  Administered 2016-03-16: 0.25 mg via INTRAVENOUS
  Filled 2016-03-16: qty 5

## 2016-03-16 MED ORDER — DEXTROSE 5 % IV SOLN
Freq: Once | INTRAVENOUS | Status: AC
  Administered 2016-03-16: 12:00:00 via INTRAVENOUS

## 2016-03-16 MED ORDER — SODIUM CHLORIDE 0.9 % IV SOLN
10.0000 mg | Freq: Once | INTRAVENOUS | Status: AC
  Administered 2016-03-16: 10 mg via INTRAVENOUS
  Filled 2016-03-16: qty 1

## 2016-03-16 MED ORDER — OXALIPLATIN CHEMO INJECTION 100 MG/20ML
50.0000 mg/m2 | Freq: Once | INTRAVENOUS | Status: AC
  Administered 2016-03-16: 120 mg via INTRAVENOUS
  Filled 2016-03-16: qty 24

## 2016-03-16 MED ORDER — LEUCOVORIN CALCIUM INJECTION 350 MG
400.0000 mg/m2 | Freq: Once | INTRAMUSCULAR | Status: AC
  Administered 2016-03-16: 972 mg via INTRAVENOUS
  Filled 2016-03-16: qty 48.6

## 2016-03-16 MED ORDER — SODIUM CHLORIDE 0.9 % IV SOLN
1500.0000 mg/m2 | INTRAVENOUS | Status: AC
  Administered 2016-03-16: 3650 mg via INTRAVENOUS
  Filled 2016-03-16: qty 73

## 2016-03-16 NOTE — Patient Instructions (Signed)
[facility] Discharge Instructions for Patients Receiving Chemotherapy   Beginning January 23rd 2017 lab work for the [facility] will be done in the  Main lab at [facility] on 1st floor. If you have a lab appointment with the [facility] please come in thru the  Main Entrance and check in at the main information desk   Today you received the following chemotherapy agents:  Leucovorin, oxaliplatin, and 80fu.  If you develop nausea and vomiting, or diarrhea that is not controlled by your medication, call the clinic.  The clinic phone number is [phone]. Office hours are Monday-Friday 8:30am-5:00pm.  BELOW ARE SYMPTOMS THAT SHOULD BE REPORTED IMMEDIATELY:  *FEVER GREATER THAN 101.0 F  *CHILLS WITH OR WITHOUT FEVER  NAUSEA AND VOMITING THAT IS NOT CONTROLLED WITH YOUR NAUSEA MEDICATION  *UNUSUAL SHORTNESS OF BREATH  *UNUSUAL BRUISING OR BLEEDING  TENDERNESS IN MOUTH AND THROAT WITH OR WITHOUT PRESENCE OF ULCERS  *URINARY PROBLEMS  *BOWEL PROBLEMS  UNUSUAL RASH Items with * indicate a potential emergency and should be followed up as soon as possible. If you have an emergency after office hours please contact your primary care physician or go to the nearest emergency department.  Please call the clinic during office hours if you have any questions or concerns.   You may also contact the Patient Navigator at [phone] should you have any questions or need assistance in obtaining follow up care.      Resources For Cancer Patients and their Caregivers ? American Cancer Society: Can assist with transportation, wigs, general needs, runs Look Good Feel Better.        662-224-9276 ? Cancer Care: Provides financial assistance, online support groups, medication/co-pay assistance.  1-800-813-HOPE (661) 357-2203) ? [place] Assists [place] cancer patients and their families through emotional , educational and financial support.   2082973464 ? [name] DSS Where to apply for food stamps, Medicaid and utility assistance. (401)356-7065 ? [name]: Transportation to medical appointments. 250-763-7252 ? Social Security Administration: May apply for disability if have a Stage IV cancer. ([phone] 217-512-5123 ? [employer], Disability and Transit Services: Assists with nutrition, care and transit needs. [phone]

## 2016-03-16 NOTE — Progress Notes (Signed)
Tolerated tx w/o adverse reaction.  A&Ox4, in no distress.  VSS.  Discharged via wheelchair in c/o family for transport home.  

## 2016-03-16 NOTE — Progress Notes (Signed)
[facility] Clinical Social Work  Clinical Social Work was referred by Futures trader for assessment of psychosocial needs due to new patient transferred from [facility]. Clinical Social Worker attempted to contact patient at home to offer support and assess for needs.  CSW left brief message explaining role of CSW and how to contact CSW at both [facility] and [facility]. CSW awaits reurn call and will continue to try to connect with pt.    Clinical Social Work interventions: Attempt to assess needs  Loren Racer, [place] Tuesdays   [phone]

## 2016-03-16 NOTE — Addendum Note (Signed)
Addended by: Joie Bimler on: 03/16/2016 12:08 PM   Modules accepted: Orders

## 2016-03-16 NOTE — Addendum Note (Signed)
Addended by: Joie Bimler on: 03/16/2016 01:36 PM   Modules accepted: Medications

## 2016-03-17 ENCOUNTER — Encounter (HOSPITAL_BASED_OUTPATIENT_CLINIC_OR_DEPARTMENT_OTHER): Payer: Medicare Other

## 2016-03-17 DIAGNOSIS — C189 Malignant neoplasm of colon, unspecified: Secondary | ICD-10-CM | POA: Diagnosis not present

## 2016-03-17 DIAGNOSIS — C180 Malignant neoplasm of cecum: Secondary | ICD-10-CM

## 2016-03-17 MED ORDER — HEPARIN SOD (PORK) LOCK FLUSH 100 UNIT/ML IV SOLN
500.0000 [IU] | Freq: Once | INTRAVENOUS | Status: AC | PRN
  Administered 2016-03-17: 500 [IU]

## 2016-03-17 MED ORDER — SODIUM CHLORIDE 0.9% FLUSH
10.0000 mL | INTRAVENOUS | Status: DC | PRN
  Administered 2016-03-17: 10 mL
  Filled 2016-03-17: qty 10

## 2016-03-17 NOTE — Patient Instructions (Signed)
[place] at [facility] Discharge Instructions  RECOMMENDATIONS MADE BY THE CONSULTANT AND ANY TEST RESULTS WILL BE SENT TO YOUR REFERRING PHYSICIAN.  Pump removal and port flush today. Return as scheduled for office visit and chemotherapy. Call the clinic should you have any questions or concerns prior to your next visit.  Thank you for choosing [place] at [facility] to provide your oncology and hematology care.  To afford each patient quality time with our provider, please arrive at least 15 minutes before your scheduled appointment time.   Beginning January 23rd 2017 lab work for the [name] will be done in the  Main lab at [facility] on 1st floor. If you have a lab appointment with the [place] please come in thru the  Main Entrance and check in at the main information desk  You need to re-schedule your appointment should you arrive 10 or more minutes late.  We strive to give you quality time with our providers, and arriving late affects you and other patients whose appointments are after yours.  Also, if you no show three or more times for appointments you may be dismissed from the clinic at the providers discretion.     Again, thank you for choosing [facility].  Our hope is that these requests will decrease the amount of time that you wait before being seen by our physicians.       _____________________________________________________________  Should you have questions after your visit to [facility], please contact our office at [phone] between the hours of 8:30 a.m. and 4:30 p.m.  Voicemails left after 4:30 p.m. will not be returned until the following business day.  For prescription refill requests, have your pharmacy contact our office.         Resources For Cancer Patients and their Caregivers ? American Cancer Society: Can assist with transportation, wigs, general needs, runs Look  Good Feel Better.        305-330-1797 ? Cancer Care: Provides financial assistance, online support groups, medication/co-pay assistance.  1-800-813-HOPE (737)192-7644) ? [place] Assists [place] cancer patients and their families through emotional , educational and financial support.  228-374-5563 ? [name] DSS Where to apply for food stamps, Medicaid and utility assistance. 630-034-1582 ? [name]: Transportation to medical appointments. 480-133-5914 ? Social Security Administration: May apply for disability if have a Stage IV cancer. 425-013-2842 (854)577-3184 ? [employer], Disability and Transit Services: Assists with nutrition, care and transit needs. [place] Support Programs: @10RELATIVEDAYS @ > Cancer Support Group  2nd Tuesday of the month 1pm-2pm, [name]  > Creative Journey  3rd Tuesday of the month 1130am-1pm, [name]  > Look Good Feel Better  1st Wednesday of the month 10am-12 noon, [name] (Call [place] to register [phone])

## 2016-03-17 NOTE — Progress Notes (Signed)
Nathaniel Johnston presents to have home infusion pump d/c'd and for port-a-cath deaccess with flush.  Portacath located left chest wall accessed with  H 20 needle.  Good blood return present. Portacath flushed with NS and 500U/2ml Heparin, and needle removed intact.  Procedure tolerated well and without incident.

## 2016-03-18 NOTE — Progress Notes (Signed)
24 hour call back-left a message for pt to check on him after chemo, told pt to call back if he had any problems

## 2016-03-24 ENCOUNTER — Ambulatory Visit (HOSPITAL_COMMUNITY)
Admission: RE | Admit: 2016-03-24 | Discharge: 2016-03-24 | Disposition: A | Discharge status: Home / Self Care | Payer: Medicare Other | Source: Ambulatory Visit | Attending: Oncology | Admitting: Oncology

## 2016-03-24 DIAGNOSIS — R590 Localized enlarged lymph nodes: Secondary | ICD-10-CM | POA: Insufficient documentation

## 2016-03-24 DIAGNOSIS — K5730 Diverticulosis of large intestine without perforation or abscess without bleeding: Secondary | ICD-10-CM | POA: Diagnosis not present

## 2016-03-24 DIAGNOSIS — K8020 Calculus of gallbladder without cholecystitis without obstruction: Secondary | ICD-10-CM | POA: Insufficient documentation

## 2016-03-24 DIAGNOSIS — I700 Atherosclerosis of aorta: Secondary | ICD-10-CM | POA: Diagnosis not present

## 2016-03-24 DIAGNOSIS — N1330 Unspecified hydronephrosis: Secondary | ICD-10-CM | POA: Insufficient documentation

## 2016-03-24 DIAGNOSIS — C180 Malignant neoplasm of cecum: Secondary | ICD-10-CM

## 2016-03-24 MED ORDER — SODIUM CHLORIDE 0.9% FLUSH
INTRAVENOUS | Status: AC
  Filled 2016-03-24: qty 10

## 2016-03-24 MED ORDER — HEPARIN SOD (PORK) LOCK FLUSH 100 UNIT/ML IV SOLN
INTRAVENOUS | Status: AC
  Filled 2016-03-24: qty 5

## 2016-03-24 MED ORDER — IOPAMIDOL (ISOVUE-300) INJECTION 61%
100.0000 mL | Freq: Once | INTRAVENOUS | Status: AC | PRN
  Administered 2016-03-24: 100 mL via INTRAVENOUS

## 2016-03-24 MED ORDER — HEPARIN SOD (PORK) LOCK FLUSH 100 UNIT/ML IV SOLN
500.0000 [IU] | INTRAVENOUS | Status: AC | PRN
  Administered 2016-03-24: 500 [IU]

## 2016-03-25 ENCOUNTER — Inpatient Hospital Stay (HOSPITAL_COMMUNITY): Payer: Medicare Other

## 2016-03-25 ENCOUNTER — Telehealth (HOSPITAL_COMMUNITY): Payer: Self-pay | Admitting: *Deleted

## 2016-03-30 ENCOUNTER — Encounter (HOSPITAL_COMMUNITY): Payer: Self-pay | Admitting: Oncology

## 2016-04-01 ENCOUNTER — Encounter (HOSPITAL_COMMUNITY): Payer: Medicare Other | Attending: Oncology | Admitting: Hematology & Oncology

## 2016-04-01 ENCOUNTER — Encounter (HOSPITAL_BASED_OUTPATIENT_CLINIC_OR_DEPARTMENT_OTHER): Payer: Medicare Other

## 2016-04-01 ENCOUNTER — Encounter (HOSPITAL_COMMUNITY): Payer: Self-pay

## 2016-04-01 ENCOUNTER — Inpatient Hospital Stay (HOSPITAL_COMMUNITY): Payer: Medicare Other

## 2016-04-01 VITALS — BP 177/71 | HR 89 | Temp 99.5°F | Resp 16 | Wt 260.0 lb

## 2016-04-01 DIAGNOSIS — C182 Malignant neoplasm of ascending colon: Secondary | ICD-10-CM | POA: Diagnosis not present

## 2016-04-01 DIAGNOSIS — N186 End stage renal disease: Secondary | ICD-10-CM

## 2016-04-01 DIAGNOSIS — C180 Malignant neoplasm of cecum: Secondary | ICD-10-CM

## 2016-04-01 DIAGNOSIS — Z66 Do not resuscitate: Secondary | ICD-10-CM | POA: Diagnosis not present

## 2016-04-01 DIAGNOSIS — R197 Diarrhea, unspecified: Secondary | ICD-10-CM | POA: Insufficient documentation

## 2016-04-01 DIAGNOSIS — Z5111 Encounter for antineoplastic chemotherapy: Secondary | ICD-10-CM | POA: Diagnosis not present

## 2016-04-01 DIAGNOSIS — Z992 Dependence on renal dialysis: Secondary | ICD-10-CM | POA: Diagnosis not present

## 2016-04-01 LAB — CBC WITH DIFFERENTIAL/PLATELET
Basophils Absolute: 0 10*3/uL (ref 0.0–0.1)
Basophils Relative: 0 %
EOS PCT: 4 %
Eosinophils Absolute: 0.2 10*3/uL (ref 0.0–0.7)
HEMATOCRIT: 26.1 % — AB (ref 39.0–52.0)
Hemoglobin: 8.6 g/dL — ABNORMAL LOW (ref 13.0–17.0)
LYMPHS ABS: 1 10*3/uL (ref 0.7–4.0)
LYMPHS PCT: 21 %
MCH: 33.3 pg (ref 26.0–34.0)
MCHC: 33 g/dL (ref 30.0–36.0)
MCV: 101.2 fL — AB (ref 78.0–100.0)
MONO ABS: 0.3 10*3/uL (ref 0.1–1.0)
MONOS PCT: 7 %
NEUTROS ABS: 3.2 10*3/uL (ref 1.7–7.7)
Neutrophils Relative %: 68 %
Platelets: 186 10*3/uL (ref 150–400)
RBC: 2.58 MIL/uL — ABNORMAL LOW (ref 4.22–5.81)
RDW: 17 % — AB (ref 11.5–15.5)
WBC: 4.7 10*3/uL (ref 4.0–10.5)

## 2016-04-01 LAB — COMPREHENSIVE METABOLIC PANEL
ALT: 12 U/L — ABNORMAL LOW (ref 17–63)
AST: 17 U/L (ref 15–41)
Albumin: 3.5 g/dL (ref 3.5–5.0)
Alkaline Phosphatase: 51 U/L (ref 38–126)
Anion gap: 6 (ref 5–15)
BILIRUBIN TOTAL: 0.5 mg/dL (ref 0.3–1.2)
BUN: 22 mg/dL — AB (ref 6–20)
CO2: 30 mmol/L (ref 22–32)
Calcium: 7.9 mg/dL — ABNORMAL LOW (ref 8.9–10.3)
Chloride: 100 mmol/L — ABNORMAL LOW (ref 101–111)
Creatinine, Ser: 4.91 mg/dL — ABNORMAL HIGH (ref 0.61–1.24)
GFR, EST AFRICAN AMERICAN: 13 mL/min — AB (ref >60)
GFR, EST NON AFRICAN AMERICAN: 12 mL/min — AB (ref >60)
Glucose, Bld: 153 mg/dL — ABNORMAL HIGH (ref 65–99)
POTASSIUM: 3.3 mmol/L — AB (ref 3.5–5.1)
Sodium: 136 mmol/L (ref 135–145)
TOTAL PROTEIN: 6.6 g/dL (ref 6.5–8.1)

## 2016-04-01 MED ORDER — LOPERAMIDE HCL 2 MG PO CAPS: ORAL_CAPSULE | ORAL | Status: DC

## 2016-04-01 MED ORDER — LEUCOVORIN CALCIUM INJECTION 350 MG
400.0000 mg/m2 | Freq: Once | INTRAVENOUS | Status: AC
  Administered 2016-04-01: 972 mg via INTRAVENOUS
  Filled 2016-04-01: qty 48.6

## 2016-04-01 MED ORDER — FLUOROURACIL CHEMO INJECTION 5 GM/100ML
1500.0000 mg/m2 | INTRAVENOUS | Status: DC
  Administered 2016-04-01: 3650 mg via INTRAVENOUS
  Filled 2016-04-01: qty 73

## 2016-04-01 MED ORDER — FLUOROURACIL CHEMO INJECTION 2.5 GM/50ML
300.0000 mg/m2 | Freq: Once | INTRAVENOUS | Status: AC
  Administered 2016-04-01: 750 mg via INTRAVENOUS
  Filled 2016-04-01: qty 15

## 2016-04-01 MED ORDER — HEPARIN SOD (PORK) LOCK FLUSH 100 UNIT/ML IV SOLN
500.0000 [IU] | Freq: Once | INTRAVENOUS | Status: DC | PRN
  Filled 2016-04-01: qty 5

## 2016-04-01 MED ORDER — SODIUM CHLORIDE 0.9% FLUSH: 10.0000 mL | INTRAVENOUS | Status: DC | PRN

## 2016-04-01 MED ORDER — SODIUM CHLORIDE 0.9 % IV SOLN
10.0000 mg | Freq: Once | INTRAVENOUS | Status: AC
  Administered 2016-04-01: 10 mg via INTRAVENOUS
  Filled 2016-04-01: qty 1

## 2016-04-01 MED ORDER — OXALIPLATIN CHEMO INJECTION 100 MG/20ML
50.0000 mg/m2 | Freq: Once | INTRAVENOUS | Status: AC
  Administered 2016-04-01: 120 mg via INTRAVENOUS
  Filled 2016-04-01: qty 24

## 2016-04-01 MED ORDER — PALONOSETRON HCL INJECTION 0.25 MG/5ML
0.2500 mg | Freq: Once | INTRAVENOUS | Status: AC
  Administered 2016-04-01: 0.25 mg via INTRAVENOUS
  Filled 2016-04-01: qty 5

## 2016-04-01 MED ORDER — DEXTROSE 5 % IV SOLN
Freq: Once | INTRAVENOUS | Status: AC
  Administered 2016-04-01: 12:00:00 via INTRAVENOUS

## 2016-04-01 NOTE — Patient Instructions (Signed)
[place] at [facility] Discharge Instructions  RECOMMENDATIONS MADE BY THE CONSULTANT AND ANY TEST RESULTS WILL BE SENT TO YOUR REFERRING PHYSICIAN.  Return to clinic in 2 weeks with labs and chemo Immodium Prescription given  Thank you for choosing [place] at [facility] to provide your oncology and hematology care.  To afford each patient quality time with our provider, please arrive at least 15 minutes before your scheduled appointment time.   Beginning January 23rd 2017 lab work for the [name] will be done in the  Main lab at [facility] on 1st floor. If you have a lab appointment with the [facility] please come in thru the  Main Entrance and check in at the main information desk  You need to re-schedule your appointment should you arrive 10 or more minutes late.  We strive to give you quality time with our providers, and arriving late affects you and other patients whose appointments are after yours.  Also, if you no show three or more times for appointments you may be dismissed from the clinic at the providers discretion.     Again, thank you for choosing [facility].  Our hope is that these requests will decrease the amount of time that you wait before being seen by our physicians.       _____________________________________________________________  Should you have questions after your visit to [facility], please contact our office at [phone] between the hours of 8:30 a.m. and 4:30 p.m.  Voicemails left after 4:30 p.m. will not be returned until the following business day.  For prescription refill requests, have your pharmacy contact our office.         Resources For Cancer Patients and their Caregivers ? American Cancer Society: Can assist with transportation, wigs, general needs, runs Look Good Feel Better.        [phone] ? Cancer Care: Provides financial assistance, online  support groups, medication/co-pay assistance.  1-800-813-HOPE 438-331-1039) ? [place] Assists [place] cancer patients and their families through emotional , educational and financial support.  805-240-4656 ? [name] DSS Where to apply for food stamps, Medicaid and utility assistance. (508)118-2037 ? [name]: Transportation to medical appointments. 907-470-7444 ? Social Security Administration: May apply for disability if have a Stage IV cancer. 5407101228 782-213-9299 ? [employer], Disability and Transit Services: Assists with nutrition, care and transit needs. [place] Support Programs: @10RELATIVEDAYS @ > Cancer Support Group  2nd Tuesday of the month 1pm-2pm, [name]  > Creative Journey  3rd Tuesday of the month 1130am-1pm, [name]  > Look Good Feel Better  1st Wednesday of the month 10am-12 noon, [name] (Call [place] to register [phone])

## 2016-04-01 NOTE — Progress Notes (Signed)
Nathaniel Johnston Tolerated chemotherapy well today Discharged via wheelchair  Pump connected

## 2016-04-01 NOTE — Progress Notes (Signed)
[facility] Hematology/Oncology Progress Note  Name: Nathaniel Johnston      MRN: [id number]     Date: 04/01/2016 Time:1:15 PM   REFERRING PHYSICIAN:  Everardo All, MD ([place])  REASON FOR CONSULT:  Transfer of medical oncology care.   DIAGNOSIS:  Stage IV adenocarcinoma of colon  HISTORY OF PRESENT ILLNESS:   Nathaniel Johnston is a 60 y.o. 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    Adenocarcinoma of cecum ([place])   06/19/2014 Pathology Results Biopsy of right colon mass positive for adenocarcinoma. He had multiple other tubular adenomas and an occasional tubulovillous adenomas seen. Cancer is K-ras mutation negative (wild type).   07/19/2014 Procedure Colonoscopy   07/19/2014 Pathology Results Right colon mass- invasive adenocarcinoma   07/25/2014 Surgery Right hemicolectomy performed by Dr. Ladona Horns   07/25/2014 Pathology Results mucinous adenocarcinoma, 6 cm in maximum dimension, arising from the cecum. positive for 19/25 nodes, +LVI, focal neural invasion, poorly differentiated with numerous extramural nodules, greater than 10   08/10/2014 Pathology Results KRAS/NRAS mutation in negative.  No KRAS or NRAS mutations were detected in exon 2, 3, and 4.   08/29/2014 - 02/20/2015 Chemotherapy FOLFOX adjusted for renal failure and on dialysis, 12 cycles   04/02/2015 Imaging CT abdomen pelvis-interval resolution of previously described anterior abdominal wall abscess. Ventral abdominal wall wound is predominantly healed. Small fat containing ventral abdominal wall hernia. NED.   08/18/2015 Progression    08/18/2015 PET scan Prominent recurrence with extensive retroperitoneal hypermetabolic adenopathy, extensive adenopathy and nodularity in the right  perirenal space and tracking in the right peritoneum. Hypermetabolic mass along the bowel anastomotic site.    09/04/2015 -  Chemotherapy FOLFOX chemotherapy reinitiated for recurrent disease.   11/28/2015 Imaging CT abdomen and pelvis-persistent nodularity along the right pararenal space, reduction in retroperitoneal lymphadenopathy, persistent enlarged retroperitoneal lymph node adjacent to the right kidney, no evidence of disease progression or liver metastases.   03/11/2016 Miscellaneous Transfer of medical oncology care to [facility] (from [facility])   03/11/2016 Code Status DNR   03/24/2016 Imaging CT CAP- Mild increase in abdominal retroperitoneal lymphadenopathy since previous study. New mild right hydronephrosis also noted. No significant change in retroperitoneal soft tissue nodularity in right pararenal space, consistent with metastatic disease    Chart is reviewed in detail.  In summary, the patient was diagnosed with a Stage IIIC  In 2015.  He was treated definitively with surgery followed by adjuvant chemotherapy consisting of FOLFOX x 6 months.  Unfortunately, he had high-risk disease and recurrence was noted on PET imaging on 08/13/2015 when his CEA climbed.  He was therefore rechallenged with FOLFOX and has done well since.  He has been on FOLFOX since 09/04/2015 and repeat CT scans in March 2017 demonstrate improvement in disease burden.  The patient notes that he is tolerating therapy well. As a result, we will continue with the same doses.  He denies any complaints today.  He reports that dialysis is going well.  Nathaniel Johnston returns to the [facility] today accompanied  The patient is still on dialysis.  He notes that he's doing all right in general. He says "what I'm doing now, it's not bad." He was advised that he  can anticipate most of what we do to be about the same.  Nathaniel Johnston denies problems with nausea and vomiting, but comments that his only  issue is a little diarrhea.   He says his hands and feet are doing okay; he's eating okay; and that dialysis is going good. He denies any new pain anywhere.  He would like a prescription for imodium today. He cannot think of any other refills or prescriptions he needs at present.   Review of Systems  Constitutional: Negative for fever, chills and weight loss.  HENT: Negative for sore throat.   Eyes: Negative for blurred vision and double vision.  Respiratory: Negative for cough, hemoptysis, sputum production, shortness of breath and wheezing.   Cardiovascular: Positive for leg swelling. Negative for chest pain and palpitations.  Gastrointestinal: Positive for diarrhea. Negative for nausea, vomiting, abdominal pain, constipation, blood in stool and melena.  Genitourinary: Negative for dysuria, urgency, frequency and hematuria.  Musculoskeletal: Negative for myalgias and falls.  Skin: Negative for itching and rash.  Neurological: Negative for dizziness and headaches.  Endo/Heme/Allergies: Does not bruise/bleed easily.  Psychiatric/Behavioral: Negative.   14 point review of systems was performed and is negative except as detailed under history of present illness and above    PAST MEDICAL HISTORY:   Past Medical History  Diagnosis Date  . High cholesterol   . Hypertension   . Diabetes mellitus without complication ([place])   . Stroke [facility])     2003  . GERD (gastroesophageal reflux disease)   . Cancer ([place]) 07/2014    colon cancer surgery.  Finished Chemo 02/2015  . Chronic kidney disease   . Adenocarcinoma of cecum ([place]) 03/08/2016  . DNR (do not resuscitate) 03/11/2016  . ESRD (end stage renal disease) ([place]) 03/11/2016    ALLERGIES: No Known Allergies    MEDICATIONS: I have reviewed the patient's current medications.    Current Outpatient Prescriptions on File Prior to Visit  Medication Sig Dispense Refill  . amLODipine (NORVASC) 10 MG tablet Take 2 tablets by mouth.    .  calcium acetate (PHOSLO) 667 MG capsule Take 1 capsule by mouth.    Marland Kitchen glipiZIDE (GLUCOTROL) 10 MG tablet Take 1 tablet by mouth.    . Iron Polysacch Cmplx-B12-FA 150-0.025-1 MG CAPS Take 1 capsule by mouth.    . labetalol (NORMODYNE) 300 MG tablet Take 2 tablets by mouth.    Marland Kitchen lisinopril (PRINIVIL,ZESTRIL) 40 MG tablet Take 1 tablet by mouth.    . loperamide (IMODIUM) 2 MG capsule Take 2 mg by mouth.    . sevelamer carbonate (RENVELA) 800 MG tablet Take 1 tablet by mouth.    . simvastatin (ZOCOR) 80 MG tablet Take 1 tablet by mouth.    . Skin Protectants, Misc. (EUCERIN) cream Apply topically.    . triamcinolone cream (KENALOG) 0.1 % Apply topically.     Current Facility-Administered Medications on File Prior to Visit  Medication Dose Route Frequency Provider Last Rate Last Dose  . sodium chloride flush (NS) 0.9 % injection 10 mL  10 mL Intracatheter PRN Patrici Ranks, MD         PAST SURGICAL HISTORY Past Surgical History  Procedure Laterality Date  . Dialysis fistula creation Right   . Colectomy  07/2014  . Toe amputation Right 2000  . Amputation toe Left 2001  . Cataract extraction w/phaco Right 06/16/2015    Procedure: CATARACT EXTRACTION PHACO AND INTRAOCULAR LENS PLACEMENT RIGHT EYE CDE=54.69;  Surgeon: Williams Che, MD;  Location: [facility];  Service: Ophthalmology;  Laterality: Right;  . Yag laser application Right 56/43/3295    Procedure: YAG LASER APPLICATION;  Surgeon: Williams Che, MD;  Location: [facility];  Service: Ophthalmology;  Laterality: Right;    FAMILY HISTORY: No family history on file.  He has a brother who lives nearby. He also has a sister His mother is living.  Her health is "good"  She is 83 yo Father is deceased in his 45's from unknown reason. He has a daughter and 2 grandbabies.  They live in [place] as she is married to a Nature conservation officer man.  SOCIAL HISTORY:  reports that he quit smoking about 25 years ago. His smoking use included Cigarettes. He has  a 7.5 pack-year smoking history. He does not have any smokeless tobacco history on file. He reports that he drinks about 3.0 oz of alcohol per week. He reports that he does not use illicit drugs. He is a Engineer, manufacturing.  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  He retired with disability from 2 work injuries leading to bilateral amputation of #1 toe.  He ascertained disability in 2003.  Social History   Social History  . Marital Status: Married    Spouse Name: N/A  . Number of Children: N/A  . Years of Education: N/A   Occupational History  . Metal [employer]     [employer]   Social History Main Topics  . Smoking status: Former Smoker -- 0.50 packs/day for 15 years    Types: Cigarettes    Quit date: 03/12/1991  . Smokeless tobacco: Not on file  . Alcohol Use: 3.0 oz/week    5 Shots of liquor per week  . Drug Use: No  . Sexual Activity: Not on file   Other Topics Concern  . Not on file   Social History Narrative    PERFORMANCE STATUS: The patient's performance status is 2 - Symptomatic, <50% confined to bed   PHYSICAL EXAM: Most Recent Vital Signs: Vitals - 1 value per visit 10/04/8414  SYSTOLIC 606  DIASTOLIC 71  Pulse 89  Temperature 99.5  Respirations 16  Weight (lb) 260  Height   BMI 36.28    General appearance: alert, appears older than stated age, no distress, moderately obese and in wheelchair, accompanied by a friend who assists with transportation Head: Normocephalic, without obvious abnormality, atraumatic Throat: normal findings: oropharynx pink & moist without lesions or evidence of thrush Neck: supple, symmetrical, trachea midline Lungs: clear to auscultation bilaterally and normal percussion bilaterally Heart: regular rate and rhythm Extremities: edema B/L 1+ pitting edema pre-tibially without erythema or heat.  No tenderness to palpation.  Socks carry his cell phone and wallet. Skin: Skin color, texture, turgor normal. No  rashes or lesions Neurologic: Grossly normal  LABORATORY DATA:  Results for orders placed or performed in visit on 04/01/16 (from the past 48 hour(s))  CBC with Differential     Status: Abnormal   Collection Time: 04/01/16 11:00 AM  Result Value Ref Range   WBC 4.7 4.0 - 10.5 K/uL   RBC 2.58 (L) 4.22 - 5.81 MIL/uL   Hemoglobin 8.6 (L) 13.0 - 17.0 g/dL   HCT 26.1 (L) 39.0 - 52.0 %   MCV 101.2 (H) 78.0 - 100.0 fL   MCH 33.3 26.0 - 34.0 pg   MCHC 33.0 30.0 - 36.0 g/dL   RDW 17.0 (H) 11.5 - 15.5 %   Platelets 186 150 - 400 K/uL  Neutrophils Relative % 68 %   Neutro Abs 3.2 1.7 - 7.7 K/uL   Lymphocytes Relative 21 %   Lymphs Abs 1.0 0.7 - 4.0 K/uL   Monocytes Relative 7 %   Monocytes Absolute 0.3 0.1 - 1.0 K/uL   Eosinophils Relative 4 %   Eosinophils Absolute 0.2 0.0 - 0.7 K/uL   Basophils Relative 0 %   Basophils Absolute 0.0 0.0 - 0.1 K/uL  Comprehensive metabolic panel     Status: Abnormal   Collection Time: 04/01/16 11:00 AM  Result Value Ref Range   Sodium 136 135 - 145 mmol/L   Potassium 3.3 (L) 3.5 - 5.1 mmol/L   Chloride 100 (L) 101 - 111 mmol/L   CO2 30 22 - 32 mmol/L   Glucose, Bld 153 (H) 65 - 99 mg/dL   BUN 22 (H) 6 - 20 mg/dL   Creatinine, Ser 4.91 (H) 0.61 - 1.24 mg/dL   Calcium 7.9 (L) 8.9 - 10.3 mg/dL   Total Protein 6.6 6.5 - 8.1 g/dL   Albumin 3.5 3.5 - 5.0 g/dL   AST 17 15 - 41 U/L   ALT 12 (L) 17 - 63 U/L   Alkaline Phosphatase 51 38 - 126 U/L   Total Bilirubin 0.5 0.3 - 1.2 mg/dL   GFR calc non Af Amer 12 (L) >60 mL/min   GFR calc Af Amer 13 (L) >60 mL/min    Comment: (NOTE) The eGFR has been calculated using the CKD EPI equation. This calculation has not been validated in all clinical situations. eGFR's persistently <60 mL/min signify possible Chronic Kidney Disease.    Anion gap 6 5 - 15           Results for WILLMAN, CUNY (MRN [id number])   Ref. Range 04/01/2016 11:00  CEA Latest Ref Range: 0.0 - 4.7 ng/mL 187.1 (H)         RADIOGRAPHY: No results found.     Study Result     CLINICAL DATA: Followup metastatic adenocarcinoma of cecum. Previous surgery and chemotherapy.  EXAM: CT CHEST, ABDOMEN, AND PELVIS WITH CONTRAST  TECHNIQUE: Multidetector CT imaging of the chest, abdomen and pelvis was performed following the standard protocol during bolus administration of intravenous contrast.  CONTRAST: 155m ISOVUE-300 IOPAMIDOL (ISOVUE-300) INJECTION 61%  COMPARISON: [facility] CT on 11/28/2015 and PET-CT on 08/13/2015  FINDINGS: CT CHEST FINDINGS  Mediastinum/Lymph Nodes: 9 mm posterior paraesophageal lymph node is seen on image 29/series 2, which measures 11 mm on previous study. No other sites of lymphadenopathy identified within the thorax. Three-vessel coronary artery calcification noted.  Lungs/Pleura: No pulmonary mass, infiltrate, or effusion. Mild bilateral lower lobe scarring is stable. Azygos fissure again noted.  Musculoskeletal: No chest wall mass or suspicious bone lesions identified.  CT ABDOMEN PELVIS FINDINGS  Hepatobiliary: No liver masses are identified. Tiny calcified gallstones again seen, however there is no evidence of cholecystitis or biliary dilatation.  Pancreas: No mass, inflammatory changes, or other significant abnormality.  Spleen: Within normal limits in size and appearance.  Adrenals/Urinary Tract: No adrenal or renal masses are identified. Bilateral renal parenchymal atrophy again demonstrated as well as tiny right renal cysts. Mild right hydronephrosis is new since previous study. No ureteral calculi identified.  Soft tissue thickening and nodularity in the right perinephric space shows no significant interval change, and is consistent with retroperitoneal metastatic disease.  Stomach/Bowel: No evidence of obstruction, inflammatory process, or abnormal fluid collections. Postop changes from partial right colectomy again  demonstrated. Severe diverticulosis is  again seen involving the descending and sigmoid colon, however there is no evidence of diverticulitis.  Vascular/Lymphatic: Abdominal retroperitoneal lymphadenopathy in the aorto-caval and left paraaortic regions is again demonstrated. Index lymph node in the aortocaval space measures 2.4 cm on image 69/series 2 compared to 1.3 cm previously. Index lymph node in the left paraaortic region measures 2.6 cm on image 77/series 2 compared to 1.9 cm previously. No pelvic lymphadenopathy identified.  No evidence of abdominal aortic aneurysm. Aortic atherosclerosis noted.  Reproductive: No mass or other significant abnormality.  Other: None.  Musculoskeletal: No suspicious bone lesions identified.  IMPRESSION: Mild increase in abdominal retroperitoneal lymphadenopathy since previous study. New mild right hydronephrosis also noted.  No significant change in retroperitoneal soft tissue nodularity in right pararenal space, consistent with metastatic disease.  Slight decrease in left mediastinal posterior paraesophageal lymph node compared to previous PET on 08/13/2015, currently measuring 9 mm compared to 11 mm previously.  Cholelithiasis. No radiographic evidence of cholecystitis.  Aortic atherosclerosis noted.  Colonic diverticulosis. No radiographic evidence of diverticulitis.   Electronically Signed  By: Earle Gell M.D.  On: 03/24/2016 14:02    PATHOLOGY:  N/A   ASSESSMENT/PLAN:   Adenocarcinoma of cecum ([place]), Stage IV ESRD on Dialysis  Stage IV adenocarcinoma of colon found on PET imaging on 08/13/2015 after being diagnosed with Stage IIIC ([id number]) on 07/25/2014 treated with definitive surgery followed by 6 months of adjuvant therapy consisting of FOLFOX. Patient was restarted on FOLFOX with dosing according to dialysis requirements. Last CEA available was from December. CEA will be rechecked today. Restaging  studies show slight progression of disease.  We will need to consider other options moving forward, he is KRAS,NRAS WT. Microsatellite status is unknown.   Will look into irinotecan dosing on dialysis if possible. Considerations for future therapy may be irinotecan/erbitux. Will discuss with Deedra Ehrich. Would not stop FOLFOX currently but will need to consider change in therapy in next few cycles.   Will need microsatellite testing.   DNR.  DNR form is completed and given to the patient.  He would like a prescription for imodium today. He cannot think of any other refills or prescriptions he needs.  He would like his next appointment to be scheduled at 10 or 10:30, to align with his dialysis.  All questions were answered. The patient knows to call the clinic with any problems, questions or concerns. We can certainly see the patient much sooner if necessary.  This document serves as a record of services personally performed by Ancil Linsey, MD. It was created on her behalf by Toni Amend, a trained medical scribe. The creation of this record is based on the scribe's personal observations and the provider's statements to them. This document has been checked and approved by the attending provider.  I have reviewed the above documentation for accuracy and completeness and I agree with the above.  This note is electronically signed by: Molli Hazard, MD  04/01/2016 1:15 PM

## 2016-04-01 NOTE — Patient Instructions (Signed)
[facility] Discharge Instructions for Patients Receiving Chemotherapy   Beginning January 23rd 2017 lab work for the [facility] will be done in the  Main lab at [facility] on 1st floor. If you have a lab appointment with the [facility] please come in thru the  Main Entrance and check in at the main information desk   Today you received the following chemotherapy agents folfox Follow up as scheduled Please call the clinic if you have any questions or concerns   To help prevent nausea and vomiting after your treatment, we encourage you to take your nausea medication    If you develop nausea and vomiting, or diarrhea that is not controlled by your medication, call the clinic.  The clinic phone number is [phone]. Office hours are Monday-Friday 8:30am-5:00pm.  BELOW ARE SYMPTOMS THAT SHOULD BE REPORTED IMMEDIATELY:  *FEVER GREATER THAN 101.0 F  *CHILLS WITH OR WITHOUT FEVER  NAUSEA AND VOMITING THAT IS NOT CONTROLLED WITH YOUR NAUSEA MEDICATION  *UNUSUAL SHORTNESS OF BREATH  *UNUSUAL BRUISING OR BLEEDING  TENDERNESS IN MOUTH AND THROAT WITH OR WITHOUT PRESENCE OF ULCERS  *URINARY PROBLEMS  *BOWEL PROBLEMS  UNUSUAL RASH Items with * indicate a potential emergency and should be followed up as soon as possible. If you have an emergency after office hours please contact your primary care physician or go to the nearest emergency department.  Please call the clinic during office hours if you have any questions or concerns.   You may also contact the Patient Navigator at [phone] should you have any questions or need assistance in obtaining follow up care.      Resources For Cancer Patients and their Caregivers ? American Cancer Society: Can assist with transportation, wigs, general needs, runs Look Good Feel Better.        [phone] ? Cancer Care: Provides financial assistance, online support groups, medication/co-pay assistance.   1-800-813-HOPE 717-168-6072) ? [place] Assists [place] cancer patients and their families through emotional , educational and financial support.  [phone] ? [name] DSS Where to apply for food stamps, Medicaid and utility assistance. (218)244-3241 ? [name]: Transportation to medical appointments. 364-740-9538 ? Social Security Administration: May apply for disability if have a Stage IV cancer. 507-076-3812 219-480-4600 ? [employer], Disability and Transit Services: Assists with nutrition, care and transit needs. [phone]

## 2016-04-02 ENCOUNTER — Encounter (HOSPITAL_BASED_OUTPATIENT_CLINIC_OR_DEPARTMENT_OTHER): Payer: Medicare Other

## 2016-04-02 VITALS — BP 142/58 | HR 77 | Resp 18

## 2016-04-02 DIAGNOSIS — Z452 Encounter for adjustment and management of vascular access device: Secondary | ICD-10-CM

## 2016-04-02 DIAGNOSIS — C180 Malignant neoplasm of cecum: Secondary | ICD-10-CM

## 2016-04-02 DIAGNOSIS — C182 Malignant neoplasm of ascending colon: Secondary | ICD-10-CM

## 2016-04-02 LAB — CEA: CEA: 187.1 ng/mL — AB (ref 0.0–4.7)

## 2016-04-02 MED ORDER — HEPARIN SOD (PORK) LOCK FLUSH 100 UNIT/ML IV SOLN
500.0000 [IU] | Freq: Once | INTRAVENOUS | Status: AC | PRN
  Administered 2016-04-02: 500 [IU]

## 2016-04-02 MED ORDER — SODIUM CHLORIDE 0.9% FLUSH
10.0000 mL | INTRAVENOUS | Status: DC | PRN
  Administered 2016-04-02: 10 mL
  Filled 2016-04-02: qty 10

## 2016-04-02 NOTE — Progress Notes (Signed)
Nathaniel Johnston presented for Portacath access and flush. Portacath located right chest wall deaccessed  Needle. Blood return noted. Portacath flushed with 41ml NS and 500U/26ml Heparin and needle removed intact. Procedure without incident. Patient tolerated procedure well. Pump discontinued

## 2016-04-02 NOTE — Patient Instructions (Signed)
[place] at [facility] Discharge Instructions  RECOMMENDATIONS MADE BY THE CONSULTANT AND ANY TEST RESULTS WILL BE SENT TO YOUR REFERRING PHYSICIAN.  Pump taken off. Port flush done. Follow up as scheduled. Call for any concerns or questions.  Thank you for choosing [place] at [facility] to provide your oncology and hematology care.  To afford each patient quality time with our provider, please arrive at least 15 minutes before your scheduled appointment time.   Beginning January 23rd 2017 lab work for the [name] will be done in the  Main lab at [facility] on 1st floor. If you have a lab appointment with the [facility] please come in thru the  Main Entrance and check in at the main information desk  You need to re-schedule your appointment should you arrive 10 or more minutes late.  We strive to give you quality time with our providers, and arriving late affects you and other patients whose appointments are after yours.  Also, if you no show three or more times for appointments you may be dismissed from the clinic at the providers discretion.     Again, thank you for choosing [facility].  Our hope is that these requests will decrease the amount of time that you wait before being seen by our physicians.       _____________________________________________________________  Should you have questions after your visit to [facility], please contact our office at [phone] between the hours of 8:30 a.m. and 4:30 p.m.  Voicemails left after 4:30 p.m. will not be returned until the following business day.  For prescription refill requests, have your pharmacy contact our office.         Resources For Cancer Patients and their Caregivers ? American Cancer Society: Can assist with transportation, wigs, general needs, runs Look Good Feel Better.        763-711-5701 ? Cancer Care: Provides financial  assistance, online support groups, medication/co-pay assistance.  1-800-813-HOPE 8596045686) ? [place] Assists [place] cancer patients and their families through emotional , educational and financial support.  906-782-1988 ? [name] DSS Where to apply for food stamps, Medicaid and utility assistance. 251 650 9376 ? [name]: Transportation to medical appointments. 785 795 0896 ? Social Security Administration: May apply for disability if have a Stage IV cancer. (680) 164-2565 762-402-3935 ? [employer], Disability and Transit Services: Assists with nutrition, care and transit needs. [place] Support Programs: @10RELATIVEDAYS @ > Cancer Support Group  2nd Tuesday of the month 1pm-2pm, [name]  > Creative Journey  3rd Tuesday of the month 1130am-1pm, [name]  > Look Good Feel Better  1st Wednesday of the month 10am-12 noon, [name] (Call [place] to register [phone])

## 2016-04-06 ENCOUNTER — Other Ambulatory Visit (HOSPITAL_COMMUNITY): Payer: Self-pay | Admitting: Oncology

## 2016-04-06 DIAGNOSIS — E876 Hypokalemia: Secondary | ICD-10-CM

## 2016-04-06 MED ORDER — POTASSIUM CHLORIDE CRYS ER 20 MEQ PO TBCR: 20.0000 meq | EXTENDED_RELEASE_TABLET | Freq: Every day | ORAL | Status: DC

## 2016-04-15 ENCOUNTER — Inpatient Hospital Stay (HOSPITAL_COMMUNITY): Payer: Medicare Other

## 2016-04-15 ENCOUNTER — Ambulatory Visit (HOSPITAL_COMMUNITY): Payer: Medicare Other | Admitting: Hematology & Oncology

## 2016-04-15 ENCOUNTER — Ambulatory Visit (HOSPITAL_COMMUNITY): Payer: Medicare Other

## 2016-04-15 ENCOUNTER — Encounter (HOSPITAL_BASED_OUTPATIENT_CLINIC_OR_DEPARTMENT_OTHER): Payer: Medicare Other | Admitting: Oncology

## 2016-04-15 ENCOUNTER — Encounter (HOSPITAL_BASED_OUTPATIENT_CLINIC_OR_DEPARTMENT_OTHER): Payer: Medicare Other

## 2016-04-15 VITALS — BP 140/75 | HR 95 | Temp 98.1°F | Resp 18 | Wt 259.0 lb

## 2016-04-15 DIAGNOSIS — Z66 Do not resuscitate: Secondary | ICD-10-CM

## 2016-04-15 DIAGNOSIS — C180 Malignant neoplasm of cecum: Secondary | ICD-10-CM

## 2016-04-15 DIAGNOSIS — R310 Gross hematuria: Secondary | ICD-10-CM | POA: Diagnosis not present

## 2016-04-15 DIAGNOSIS — Z5111 Encounter for antineoplastic chemotherapy: Secondary | ICD-10-CM | POA: Diagnosis not present

## 2016-04-15 DIAGNOSIS — R319 Hematuria, unspecified: Secondary | ICD-10-CM

## 2016-04-15 LAB — URINE MICROSCOPIC-ADD ON

## 2016-04-15 LAB — COMPREHENSIVE METABOLIC PANEL
ALK PHOS: 52 U/L (ref 38–126)
ALT: 14 U/L — ABNORMAL LOW (ref 17–63)
ANION GAP: 9 (ref 5–15)
AST: 20 U/L (ref 15–41)
Albumin: 3.5 g/dL (ref 3.5–5.0)
BILIRUBIN TOTAL: 0.4 mg/dL (ref 0.3–1.2)
BUN: 22 mg/dL — ABNORMAL HIGH (ref 6–20)
CALCIUM: 8.6 mg/dL — AB (ref 8.9–10.3)
CO2: 29 mmol/L (ref 22–32)
Chloride: 99 mmol/L — ABNORMAL LOW (ref 101–111)
Creatinine, Ser: 4.74 mg/dL — ABNORMAL HIGH (ref 0.61–1.24)
GFR calc non Af Amer: 12 mL/min — ABNORMAL LOW (ref >60)
GFR, EST AFRICAN AMERICAN: 14 mL/min — AB (ref >60)
Glucose, Bld: 250 mg/dL — ABNORMAL HIGH (ref 65–99)
POTASSIUM: 4.6 mmol/L (ref 3.5–5.1)
Sodium: 137 mmol/L (ref 135–145)
TOTAL PROTEIN: 6.8 g/dL (ref 6.5–8.1)

## 2016-04-15 LAB — CBC WITH DIFFERENTIAL/PLATELET
BASOS PCT: 0 %
Basophils Absolute: 0 10*3/uL (ref 0.0–0.1)
EOS ABS: 0.2 10*3/uL (ref 0.0–0.7)
Eosinophils Relative: 4 %
HEMATOCRIT: 25.8 % — AB (ref 39.0–52.0)
HEMOGLOBIN: 8.3 g/dL — AB (ref 13.0–17.0)
LYMPHS ABS: 1 10*3/uL (ref 0.7–4.0)
Lymphocytes Relative: 23 %
MCH: 33.2 pg (ref 26.0–34.0)
MCHC: 32.2 g/dL (ref 30.0–36.0)
MCV: 103.2 fL — ABNORMAL HIGH (ref 78.0–100.0)
MONO ABS: 0.3 10*3/uL (ref 0.1–1.0)
MONOS PCT: 6 %
NEUTROS ABS: 3.1 10*3/uL (ref 1.7–7.7)
NEUTROS PCT: 67 %
Platelets: 236 10*3/uL (ref 150–400)
RBC: 2.5 MIL/uL — ABNORMAL LOW (ref 4.22–5.81)
RDW: 16.9 % — AB (ref 11.5–15.5)
WBC: 4.6 10*3/uL (ref 4.0–10.5)

## 2016-04-15 LAB — URINALYSIS, ROUTINE W REFLEX MICROSCOPIC
Bilirubin Urine: NEGATIVE
Glucose, UA: 500 mg/dL — AB
KETONES UR: NEGATIVE mg/dL
NITRITE: NEGATIVE
PH: 8.5 — AB (ref 5.0–8.0)
Protein, ur: >300 mg/dL — AB
SPECIFIC GRAVITY, URINE: 1.01 (ref 1.005–1.030)

## 2016-04-15 MED ORDER — DEXTROSE 5 % IV SOLN
Freq: Once | INTRAVENOUS | Status: AC
  Administered 2016-04-15: 13:00:00 via INTRAVENOUS

## 2016-04-15 MED ORDER — FLUOROURACIL CHEMO INJECTION 5 GM/100ML
1500.0000 mg/m2 | INTRAVENOUS | Status: DC
  Administered 2016-04-15: 3650 mg via INTRAVENOUS
  Filled 2016-04-15: qty 73

## 2016-04-15 MED ORDER — LEUCOVORIN CALCIUM INJECTION 350 MG
400.0000 mg/m2 | Freq: Once | INTRAMUSCULAR | Status: AC
  Administered 2016-04-15: 972 mg via INTRAVENOUS
  Filled 2016-04-15: qty 48.6

## 2016-04-15 MED ORDER — SODIUM CHLORIDE 0.9 % IV SOLN
10.0000 mg | Freq: Once | INTRAVENOUS | Status: AC
  Administered 2016-04-15: 10 mg via INTRAVENOUS
  Filled 2016-04-15: qty 1

## 2016-04-15 MED ORDER — OXALIPLATIN CHEMO INJECTION 100 MG/20ML
50.0000 mg/m2 | Freq: Once | INTRAVENOUS | Status: AC
  Administered 2016-04-15: 120 mg via INTRAVENOUS
  Filled 2016-04-15: qty 24

## 2016-04-15 MED ORDER — FLUOROURACIL CHEMO INJECTION 2.5 GM/50ML
300.0000 mg/m2 | Freq: Once | INTRAVENOUS | Status: AC
  Administered 2016-04-15: 750 mg via INTRAVENOUS
  Filled 2016-04-15: qty 15

## 2016-04-15 MED ORDER — PALONOSETRON HCL INJECTION 0.25 MG/5ML
0.2500 mg | Freq: Once | INTRAVENOUS | Status: AC
  Administered 2016-04-15: 0.25 mg via INTRAVENOUS
  Filled 2016-04-15: qty 5

## 2016-04-15 MED ORDER — SODIUM CHLORIDE 0.9% FLUSH
10.0000 mL | INTRAVENOUS | Status: DC | PRN
  Administered 2016-04-15: 10 mL
  Filled 2016-04-15: qty 10

## 2016-04-15 NOTE — Progress Notes (Signed)
Informed T.Kefalas,PA of hgb 8.3 as well as patient reports blood in urine x 2 this am when voiding prior to dialysis and x1 post dialysis. Denies pain or burning when he urinates. Patient states he is going to ED when he leaves here to find out what is going on with urine. OK to treat with chemo today.  Tolerated chemo well. Continuous infusion pump intact. Stable on discharge home via wheelchair with family.

## 2016-04-15 NOTE — Patient Instructions (Signed)
[place] at [facility] Discharge Instructions  RECOMMENDATIONS MADE BY THE CONSULTANT AND ANY TEST RESULTS WILL BE SENT TO YOUR REFERRING PHYSICIAN.  Exam done and seen today by Nathaniel Johnston Pre-treatment labs every 2 week done today. Treatment today. UA with culture done Return to see the Doctor on 04/29/2016 at  2:30 Call the clinic for any concerns or questions.   Thank you for choosing [place] at [facility] to provide your oncology and hematology care.  To afford each patient quality time with our provider, please arrive at least 15 minutes before your scheduled appointment time.   Beginning January 23rd 2017 lab work for the [name] will be done in the  Main lab at [facility] on 1st floor. If you have a lab appointment with the [facility] please come in thru the  Main Entrance and check in at the main information desk  You need to re-schedule your appointment should you arrive 10 or more minutes late.  We strive to give you quality time with our providers, and arriving late affects you and other patients whose appointments are after yours.  Also, if you no show three or more times for appointments you may be dismissed from the clinic at the providers discretion.     Again, thank you for choosing [facility].  Our hope is that these requests will decrease the amount of time that you wait before being seen by our physicians.       _____________________________________________________________  Should you have questions after your visit to [facility], please contact our office at [phone] between the hours of 8:30 a.m. and 4:30 p.m.  Voicemails left after 4:30 p.m. will not be returned until the following business day.  For prescription refill requests, have your pharmacy contact our office.         Resources For Cancer Patients and their Caregivers ? American Cancer Society: Can assist with  transportation, wigs, general needs, runs Look Good Feel Better.        7341121727 ? Cancer Care: Provides financial assistance, online support groups, medication/co-pay assistance.  1-800-813-HOPE 978-411-7090) ? [place] Assists [place] cancer patients and their families through emotional , educational and financial support.  907 491 0780 ? [name] DSS Where to apply for food stamps, Medicaid and utility assistance. (281)025-4271 ? [name]: Transportation to medical appointments. 702-829-9227 ? Social Security Administration: May apply for disability if have a Stage IV cancer. ([phone] 201-6898 7604302316 ? [employer], Disability and Transit Services: Assists with nutrition, care and transit needs. [place] Support Programs: @10RELATIVEDAYS @ > Cancer Support Group  2nd Tuesday of the month 1pm-2pm, [name]  > Creative Journey  3rd Tuesday of the month 1130am-1pm, [name]  > Look Good Feel Better  1st Wednesday of the month 10am-12 noon, [name] (Call [place] to register [phone])

## 2016-04-15 NOTE — Patient Instructions (Signed)
[facility] Discharge Instructions for Patients Receiving Chemotherapy   Beginning January 23rd 2017 lab work for the [facility] will be done in the  Main lab at [facility] on 1st floor. If you have a lab appointment with the [facility] please come in thru the  Main Entrance and check in at the main information desk   Today you received the following chemotherapy agents Oxaliplatin, Leucovorin and 5FU.  To help prevent nausea and vomiting after your treatment, we encourage you to take your nausea medication as instructed.  If you develop nausea and vomiting, or diarrhea that is not controlled by your medication, call the clinic.  The clinic phone number is [phone]. Office hours are Monday-Friday 8:30am-5:00pm.  BELOW ARE SYMPTOMS THAT SHOULD BE REPORTED IMMEDIATELY:  *FEVER GREATER THAN 101.0 F  *CHILLS WITH OR WITHOUT FEVER  NAUSEA AND VOMITING THAT IS NOT CONTROLLED WITH YOUR NAUSEA MEDICATION  *UNUSUAL SHORTNESS OF BREATH  *UNUSUAL BRUISING OR BLEEDING  TENDERNESS IN MOUTH AND THROAT WITH OR WITHOUT PRESENCE OF ULCERS  *URINARY PROBLEMS  *BOWEL PROBLEMS  UNUSUAL RASH Items with * indicate a potential emergency and should be followed up as soon as possible. If you have an emergency after office hours please contact your primary care physician or go to the nearest emergency department.  Please call the clinic during office hours if you have any questions or concerns.   You may also contact the Patient Navigator at [phone] should you have any questions or need assistance in obtaining follow up care.      Resources For Cancer Patients and their Caregivers ? American Cancer Society: Can assist with transportation, wigs, general needs, runs Look Good Feel Better.        830-750-5352 ? Cancer Care: Provides financial assistance, online support groups, medication/co-pay assistance.  1-800-813-HOPE 930-581-2492) ? [place] Assists [place] cancer patients and their families through emotional , educational and financial support.  4244993618 ? [name] DSS Where to apply for food stamps, Medicaid and utility assistance. 858-400-7579 ? [name]: Transportation to medical appointments. (475)595-3587 ? Social Security Administration: May apply for disability if have a Stage IV cancer. 828-564-9892 301-830-4130 ? [employer], Disability and Transit Services: Assists with nutrition, care and transit needs. [phone]

## 2016-04-16 ENCOUNTER — Encounter (HOSPITAL_COMMUNITY): Payer: Medicare Other

## 2016-04-16 ENCOUNTER — Encounter (HOSPITAL_BASED_OUTPATIENT_CLINIC_OR_DEPARTMENT_OTHER): Payer: Medicare Other

## 2016-04-16 VITALS — BP 152/60 | HR 81 | Temp 98.2°F | Resp 20

## 2016-04-16 DIAGNOSIS — Z452 Encounter for adjustment and management of vascular access device: Secondary | ICD-10-CM

## 2016-04-16 DIAGNOSIS — C180 Malignant neoplasm of cecum: Secondary | ICD-10-CM

## 2016-04-16 MED ORDER — SODIUM CHLORIDE 0.9% FLUSH
10.0000 mL | INTRAVENOUS | Status: DC | PRN
  Administered 2016-04-16: 10 mL
  Filled 2016-04-16: qty 10

## 2016-04-16 MED ORDER — HEPARIN SOD (PORK) LOCK FLUSH 100 UNIT/ML IV SOLN
INTRAVENOUS | Status: AC
  Filled 2016-04-16: qty 5

## 2016-04-16 MED ORDER — HEPARIN SOD (PORK) LOCK FLUSH 100 UNIT/ML IV SOLN
500.0000 [IU] | Freq: Once | INTRAVENOUS | Status: AC | PRN
  Administered 2016-04-16: 500 [IU]

## 2016-04-16 NOTE — Progress Notes (Signed)
Continuous infusion pump completed on patient arrival. Disconnected pump from patient. Nathaniel Johnston presented for Portacath access and flush. Proper placement of portacath confirmed by CXR. Portacath located left chest wall. Portacath flushed with 74ml NS and 500U/56ml Heparin and needle removed intact. Procedure without incident. Patient tolerated procedure well.  Patient denies any complaints post chemo. Denies any further blood in urine, reports after he was home yesterday and voided he didn't have any further bloody urine.  Stable on discharge home with family via wheelchair.

## 2016-04-16 NOTE — Progress Notes (Signed)
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  When asked how he is doing, "I am doing well."  When asked if he has any questions or concerns, he responds:"No."  He denies any worsening peripheral neuropathy.  Denies any change in his stool habits and this is at baseline.  Nursing reports that he complains of gross hematuria but he does not bring this issue up to me during discussion today.  I brought this topic up and he reports that he's had blood in his urine 3. He reports that following completion of treatment, he is considering going to the emergency room for further evaluation of this. He is not on any blood thinners. He denies any aspirin use. He denies any urinary complaints. He denies any dysuria, urinary frequency, pain with urination, or pelvic pain.   Review of Systems  Constitutional: Negative.  Negative for fever, chills and weight loss.  HENT: Negative.   Eyes: Negative.   Respiratory: Negative.   Cardiovascular: Negative.   Gastrointestinal: Negative.  Negative for nausea, vomiting, abdominal pain, diarrhea, constipation, blood in stool and melena.  Genitourinary: Positive for hematuria (gross hematuria). Negative for dysuria, urgency, frequency and flank pain.  Musculoskeletal: Negative.   Skin: Negative.  Negative for itching and rash.  Neurological: Negative.  Negative for weakness and headaches.  Endo/Heme/Allergies: Negative.   Psychiatric/Behavioral: Negative.     Past Medical History  Diagnosis Date  . High cholesterol   . Hypertension   . Diabetes mellitus without complication ([place])   . Stroke [facility])     2003  .  GERD (gastroesophageal reflux disease)   . Cancer ([place]) 07/2014    colon cancer surgery.  Finished Chemo 02/2015  . Chronic kidney disease   . 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 DNR (do not resuscitate) 03/11/2016  . ESRD (end stage renal disease) ([place]) 03/11/2016    Past Surgical History  Procedure Laterality Date  . Dialysis fistula creation Right   . Colectomy  07/2014  . Toe amputation Right 2000  . Amputation toe Left 2001  . Cataract extraction w/phaco Right 06/16/2015    Procedure: CATARACT EXTRACTION PHACO AND INTRAOCULAR LENS PLACEMENT RIGHT EYE CDE=54.69;  Surgeon: Williams Che, MD;  Location: [facility];  Service: Ophthalmology;  Laterality: Right;  . Yag laser application Right 26/94/8546    Procedure: YAG LASER APPLICATION;  Surgeon: Williams Che, MD;  Location: [facility];  Service: Ophthalmology;  Laterality: Right;    No family history on file.  Social History   Social History  . Marital Status: Married    Spouse Name: N/A  . Number of Children: N/A  . Years of Education: N/A   Occupational History  . [employer]     [employer]   Social History Main Topics  . Smoking status: Former Smoker -- 0.50 packs/day for 15 years    Types: Cigarettes    Quit date: 03/12/1991  . Smokeless tobacco: Not on file  . Alcohol Use: 3.0 oz/week    5 Shots of liquor per week  . Drug Use: No  . Sexual Activity: Not on file  Other Topics Concern  . Not on file   Social History Narrative     PHYSICAL EXAMINATION  ECOG PERFORMANCE STATUS: 2 - Symptomatic, <50% confined to bed  There were no vitals filed for this visit.  GENERAL:alert, no distress, well nourished, well developed, comfortable, cooperative, obese, smiling and in chemo-bed, companies by friend, eating [employer] burger and french fries. SKIN: skin color, texture, turgor are normal, no rashes or significant lesions HEAD: Normocephalic, No masses, lesions, tenderness or abnormalities EYES:  normal EARS: External ears normal OROPHARYNX:mucous membranes are moist  NECK: supple, trachea midline LYMPH:  no palpable lymphadenopathy BREAST:not examined LUNGS: clear to auscultation  HEART: regular rate & rhythm ABDOMEN:abdomen soft and normal bowel sounds BACK: Back symmetric, no curvature. EXTREMITIES:less then 2 second capillary refill, no joint deformities, effusion, or inflammation, no skin discoloration, no cyanosis  NEURO: alert & oriented x 3 with fluent speech, no focal motor/sensory deficits, brought to the clinic in a wheelchair.   LABORATORY DATA: CBC    Component Value Date/Time   WBC 4.6 04/15/2016 1152   RBC 2.50* 04/15/2016 1152   HGB 8.3* 04/15/2016 1152   HCT 25.8* 04/15/2016 1152   PLT 236 04/15/2016 1152   MCV 103.2* 04/15/2016 1152   MCH 33.2 04/15/2016 1152   MCHC 32.2 04/15/2016 1152   RDW 16.9* 04/15/2016 1152   LYMPHSABS 1.0 04/15/2016 1152   MONOABS 0.3 04/15/2016 1152   EOSABS 0.2 04/15/2016 1152   BASOSABS 0.0 04/15/2016 1152      Chemistry      Component Value Date/Time   NA 137 04/15/2016 1152   K 4.6 04/15/2016 1152   CL 99* 04/15/2016 1152   CO2 29 04/15/2016 1152   BUN 22* 04/15/2016 1152   CREATININE 4.74* 04/15/2016 1152      Component Value Date/Time   CALCIUM 8.6* 04/15/2016 1152   ALKPHOS 52 04/15/2016 1152   AST 20 04/15/2016 1152   ALT 14* 04/15/2016 1152   BILITOT 0.4 04/15/2016 1152        PENDING LABS:   RADIOGRAPHIC STUDIES:  Ct Chest W Contrast  03/24/2016  CLINICAL DATA:  Followup metastatic adenocarcinoma of cecum. Previous surgery and chemotherapy. EXAM: CT CHEST, ABDOMEN, AND PELVIS WITH CONTRAST TECHNIQUE: Multidetector CT imaging of the chest, abdomen and pelvis was performed following the standard protocol during bolus administration of intravenous contrast. CONTRAST:  159m ISOVUE-300 IOPAMIDOL (ISOVUE-300) INJECTION 61% COMPARISON:  [facility] CT on 11/28/2015 and PET-CT on 08/13/2015 FINDINGS: CT CHEST  FINDINGS Mediastinum/Lymph Nodes: 9 mm posterior paraesophageal lymph node is seen on image 29/series 2, which measures 11 mm on previous study. No other sites of lymphadenopathy identified within the thorax. Three-vessel coronary artery calcification noted. Lungs/Pleura: No pulmonary mass, infiltrate, or effusion. Mild bilateral lower lobe scarring is stable. Azygos fissure again noted. Musculoskeletal: No chest wall mass or suspicious bone lesions identified. CT ABDOMEN PELVIS FINDINGS Hepatobiliary: No liver masses are identified. Tiny calcified gallstones again seen, however there is no evidence of cholecystitis or biliary dilatation. Pancreas: No mass, inflammatory changes, or other significant abnormality. Spleen: Within normal limits in size and appearance. Adrenals/Urinary Tract: No adrenal or renal masses are identified. Bilateral renal parenchymal atrophy again demonstrated as well as tiny right renal cysts. Mild right hydronephrosis is new since previous study. No ureteral calculi identified. Soft tissue thickening and nodularity in the right perinephric space shows no significant interval change, and is consistent with retroperitoneal metastatic disease. Stomach/Bowel: No evidence of obstruction, inflammatory process, or abnormal fluid collections.  Postop changes from partial right colectomy again demonstrated. Severe diverticulosis is again seen involving the descending and sigmoid colon, however there is no evidence of diverticulitis. Vascular/Lymphatic: Abdominal retroperitoneal lymphadenopathy in the aorto-caval and left paraaortic regions is again demonstrated. Index lymph node in the aortocaval space measures 2.4 cm on image 69/series 2 compared to 1.3 cm previously. Index lymph node in the left paraaortic region measures 2.6 cm on image 77/series 2 compared to 1.9 cm previously. No pelvic lymphadenopathy identified. No evidence of abdominal aortic aneurysm. Aortic atherosclerosis noted.  Reproductive: No mass or other significant abnormality. Other: None. Musculoskeletal:  No suspicious bone lesions identified. IMPRESSION: Mild increase in abdominal retroperitoneal lymphadenopathy since previous study. New mild right hydronephrosis also noted. No significant change in retroperitoneal soft tissue nodularity in right pararenal space, consistent with metastatic disease. Slight decrease in left mediastinal posterior paraesophageal lymph node compared to previous PET on 08/13/2015, currently measuring 9 mm compared to 11 mm previously. Cholelithiasis.  No radiographic evidence of cholecystitis. Aortic atherosclerosis noted. Colonic diverticulosis. No radiographic evidence of diverticulitis. Electronically Signed   By: Earle Gell M.D.   On: 03/24/2016 14:02   Ct Abdomen Pelvis W Contrast  03/24/2016  CLINICAL DATA:  Followup metastatic adenocarcinoma of cecum. Previous surgery and chemotherapy. EXAM: CT CHEST, ABDOMEN, AND PELVIS WITH CONTRAST TECHNIQUE: Multidetector CT imaging of the chest, abdomen and pelvis was performed following the standard protocol during bolus administration of intravenous contrast. CONTRAST:  177m ISOVUE-300 IOPAMIDOL (ISOVUE-300) INJECTION 61% COMPARISON:  [facility] CT on 11/28/2015 and PET-CT on 08/13/2015 FINDINGS: CT CHEST FINDINGS Mediastinum/Lymph Nodes: 9 mm posterior paraesophageal lymph node is seen on image 29/series 2, which measures 11 mm on previous study. No other sites of lymphadenopathy identified within the thorax. Three-vessel coronary artery calcification noted. Lungs/Pleura: No pulmonary mass, infiltrate, or effusion. Mild bilateral lower lobe scarring is stable. Azygos fissure again noted. Musculoskeletal: No chest wall mass or suspicious bone lesions identified. CT ABDOMEN PELVIS FINDINGS Hepatobiliary: No liver masses are identified. Tiny calcified gallstones again seen, however there is no evidence of cholecystitis or biliary dilatation. Pancreas: No mass,  inflammatory changes, or other significant abnormality. Spleen: Within normal limits in size and appearance. Adrenals/Urinary Tract: No adrenal or renal masses are identified. Bilateral renal parenchymal atrophy again demonstrated as well as tiny right renal cysts. Mild right hydronephrosis is new since previous study. No ureteral calculi identified. Soft tissue thickening and nodularity in the right perinephric space shows no significant interval change, and is consistent with retroperitoneal metastatic disease. Stomach/Bowel: No evidence of obstruction, inflammatory process, or abnormal fluid collections. Postop changes from partial right colectomy again demonstrated. Severe diverticulosis is again seen involving the descending and sigmoid colon, however there is no evidence of diverticulitis. Vascular/Lymphatic: Abdominal retroperitoneal lymphadenopathy in the aorto-caval and left paraaortic regions is again demonstrated. Index lymph node in the aortocaval space measures 2.4 cm on image 69/series 2 compared to 1.3 cm previously. Index lymph node in the left paraaortic region measures 2.6 cm on image 77/series 2 compared to 1.9 cm previously. No pelvic lymphadenopathy identified. No evidence of abdominal aortic aneurysm. Aortic atherosclerosis noted. Reproductive: No mass or other significant abnormality. Other: None. Musculoskeletal:  No suspicious bone lesions identified. IMPRESSION: Mild increase in abdominal retroperitoneal lymphadenopathy since previous study. New mild right hydronephrosis also noted. No significant change in retroperitoneal soft tissue nodularity in right pararenal space, consistent with metastatic disease. Slight decrease in left mediastinal posterior paraesophageal lymph node compared to previous PET on 08/13/2015, currently measuring  9 mm compared to 11 mm previously. Cholelithiasis.  No radiographic evidence of cholecystitis. Aortic atherosclerosis noted. Colonic diverticulosis. No  radiographic evidence of diverticulitis. Electronically Signed   By: Earle Gell M.D.   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  Oncology history updated.  DNR.    Labs today (at the patient's request): CBC diff, CMET.  I personally reviewed and went over laboratory results with the patient.  The results are noted within this dictation. Laboratory work satisfies treatment criteria.  Hemoglobin is noted to decline. He is on hemodialysis and therefore getting ESA therapy from that perspective.  He reports gross hematuria and tells the nurse that following completion of chemotherapy today, he is going to report to the emergency department for further evaluation and workup of this issue. In lieu of this, I recommended patient that we perform a urinalysis with urine culture to evaluate this further. His urine is noted to consist of gross hematuria with clots. This is sent for testing. He is not on anticoagulation.  Return next week for chemotherapy at the following doses:  Oxaliplatin- 50 mg/m2  Leucovorin- 400 mg/m2  5FU Inj- 300 mg/m2  5FU CI- 1500 mg/m2 over 22-24 hours He will return on day 2 for pump D/C.  Return in 2 weeks for treatment and follow-up.  ADDENDUM: When the patient returned for DC of pump, he reports the nurse that his urine has completely returned back to normal. I wonder if he passed a renal calculus. We'll continue to follow hemoglobin closely.  I will call [facility] hospital pathology department and request NRAS and BRAF testing on his cancer specimen to help guide future treatment options.    ORDERS PLACED FOR THIS ENCOUNTER: Orders Placed This Encounter  Procedures  . Urine culture  .  Urinalysis, microscopic only    MEDICATIONS PRESCRIBED THIS ENCOUNTER: No orders of the defined types were placed in this encounter.    THERAPY PLAN:  Continue palliative treatment as outlined above.  All questions were answered. The patient knows to call the clinic with any problems, questions or concerns. We can certainly see the patient much sooner if necessary.  Patient and plan discussed with Dr. Ancil Linsey and she is in agreement with the aforementioned.   This note is electronically signed by: Doy Mince 04/16/2016 10:50 PM

## 2016-04-16 NOTE — Assessment & Plan Note (Addendum)
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  Oncology history updated.  DNR.    Labs today (at the patient's request): CBC diff, CMET.  I personally reviewed and went over laboratory results with the patient.  The results are noted within this dictation. Laboratory work satisfies treatment criteria.  Hemoglobin is noted to decline. He is on hemodialysis and therefore getting ESA therapy from that perspective.  He reports gross hematuria and tells the nurse that following completion of chemotherapy today, he is going to report to the emergency department for further evaluation and workup of this issue. In lieu of this, I recommended patient that we perform a urinalysis with urine culture to evaluate this further. His urine is noted to consist of gross hematuria with clots. This is sent for testing. He is not on anticoagulation.  Return next week for chemotherapy at the following doses:  Oxaliplatin- 50 mg/m2  Leucovorin- 400 mg/m2  5FU Inj- 300 mg/m2  5FU CI- 1500 mg/m2 over 22-24 hours He will return on day 2 for pump D/C.  Return in 2 weeks for treatment and follow-up.  ADDENDUM: When the patient returned for DC of pump, he reports the nurse that his urine has completely returned back to normal. I wonder if he passed a renal calculus. We'll continue to follow hemoglobin closely.  I will call [facility] hospital pathology department and request NRAS and BRAF testing on his cancer specimen to help guide future treatment options.

## 2016-04-16 NOTE — Patient Instructions (Signed)
[facility] at [facility] Discharge Instructions  RECOMMENDATIONS MADE BY THE CONSULTANT AND ANY TEST RESULTS WILL BE SENT TO YOUR REFERRING PHYSICIAN.  Discontinued continuous infusion pump and flushed port. Return as scheduled.  Thank you for choosing [facility] at [facility] to provide your oncology and hematology care.  To afford each patient quality time with our provider, please arrive at least 15 minutes before your scheduled appointment time.   Beginning January 23rd 2017 lab work for the Cancer Center will be done in the  Main lab at [place] on 1st floor. If you have a lab appointment with the Cancer Center please come in thru the  Main Entrance and check in at the main information desk  You need to re-schedule your appointment should you arrive 10 or more minutes late.  We strive to give you quality time with our providers, and arriving late affects you and other patients whose appointments are after yours.  Also, if you no show three or more times for appointments you may be dismissed from the clinic at the providers discretion.     Again, thank you for choosing [facility].  Our hope is that these requests will decrease the amount of time that you wait before being seen by our physicians.       _____________________________________________________________  Should you have questions after your visit to [facility], please contact our office at [phone] between the hours of 8:30 a.m. and 4:30 p.m.  Voicemails left after 4:30 p.m. will not be returned until the following business day.  For prescription refill requests, have your pharmacy contact our office.         Resources For Cancer Patients and their Caregivers ? American Cancer Society: Can assist with transportation, wigs, general needs, runs Look Good Feel Better.        1-888-227-6333 ? Cancer Care: Provides financial assistance, online  support groups, medication/co-pay assistance.  1-800-813-HOPE (4673) ? [facility] Assists [name] cancer patients and their families through emotional , educational and financial support.  336-427-4357 ? [name] DSS Where to apply for food stamps, Medicaid and utility assistance. 336-342-1394 ? [name]: Transportation to medical appointments. 336-347-2287 ? Social Security Administration: May apply for disability if have a Stage IV cancer. 336-342-7796 1-800-772-1213 ? [name] Aging, Disability and Transit Services: Assists with nutrition, care and transit needs. 336-349-2343  Cancer Center Support Programs: @10RELATIVEDAYS@ > Cancer Support Group  2nd Tuesday of the month 1pm-2pm, [name]  > Creative Journey  3rd Tuesday of the month 1130am-1pm, [name]  > Look Good Feel Better  1st Wednesday of the month 10am-12 noon, [name] (Call American Cancer [employer] to register 1-800-395-5775)   

## 2016-04-17 LAB — URINE CULTURE

## 2016-04-19 ENCOUNTER — Encounter (HOSPITAL_COMMUNITY): Payer: Self-pay | Admitting: *Deleted

## 2016-04-25 ENCOUNTER — Encounter (HOSPITAL_COMMUNITY): Payer: Self-pay | Admitting: Hematology & Oncology

## 2016-04-26 ENCOUNTER — Other Ambulatory Visit (HOSPITAL_COMMUNITY): Payer: Self-pay | Admitting: Oncology

## 2016-04-27 MED ORDER — PROCHLORPERAZINE MALEATE 10 MG PO TABS: 10.0000 mg | ORAL_TABLET | Freq: Four times a day (QID) | ORAL | 1 refills | Status: DC | PRN

## 2016-04-27 MED ORDER — ONDANSETRON HCL 8 MG PO TABS: 8.0000 mg | ORAL_TABLET | Freq: Two times a day (BID) | ORAL | 1 refills | Status: DC | PRN

## 2016-04-27 MED ORDER — DEXAMETHASONE 4 MG PO TABS: 8.0000 mg | ORAL_TABLET | Freq: Every day | ORAL | 5 refills | Status: DC

## 2016-04-27 MED ORDER — LOPERAMIDE HCL 2 MG PO TABS: ORAL_TABLET | ORAL | 1 refills | Status: DC

## 2016-04-27 NOTE — Patient Instructions (Signed)
[place]   CHEMOTHERAPY INSTRUCTIONS  Premeds: Aloxi - high powered nausea/vomiting prevention medication used for chemotherapy patients.  Dexamethasone - steroid - given to reduce the risk of you having an allergic type reaction to the chemotherapy. Dex can cause you to feel energized, nervous/anxious/jittery, make you have trouble sleeping, and/or make you feel hot/flushed in the face/neck and/or look pink/red in the face/neck. These side effects will pass as the Dex wears off. (takes 20 minutes to infuse) Benadryl-given prior to the cetuximab to help reduce the chance of a reaction.   Erbitux (cetuximab)- this is a monoclonal antibody. This medication is derived partially from a mouse protein. Side Effects: infusion related reactions may include bronchospasm, fever, chills, shaking chills, itching, swelling around heart/blood vessels, low blood pressure, lung toxicity, acneform rash. It can also cause dry skin, fatigue, muscle aches, diarrhea, vomiting, no appetite, low white blood cell count, low magnesium, weight loss. (takes 1 hour to infuse)    Irinotecan - diarrhea, bone marrow suppression, hair loss, abdominal cramping. This drug can cause early and late diarrhea. Early diarrhea can occur within 24 hours of administration. We recommend Imodium. For diarrhea, you take Imodium 2 tablets @ the onset of diarrhea, and then 1 tablet every 2 hours until 12 hours have passed without a bowel movement. May take 2 tablets @ bedtime and every 4 hours until morning. If diarrhea recurs repeat. Call [facility] and let us know that you are having diarrhea and whether or not the Imodium is working.  (takes 90 minutes to infuse)    Leucovorin - this is a medication that is not chemo but given with chemo. This med "rescues" the healthy cells before we administer the drug 5FU. This makes the 5FU work better. (takes 2 hours to infuse- this goes in @ the same time as the Oxaliplatin or  Irinotecan chemo)    5FU: bone marrow suppression (low white blood cells - wbcs fight infection, low red blood cells - rbcs make up your blood, low platelets - this is what makes your blood clot, nausea/vomiting, diarrhea, mouth sores, hair loss, dry skin, ocular toxicities (increased tear production, sensitivity to light). You must wear sunscreen/sunglasses. Cover your skin when out in sunlight. You will get burned very easily. The nurse will sit @ your bedside and push this medication in via a syringe. This will take 10 minutes. Then she will connect you to an ambulatory pump with this medication attached. You will wear the pump for 24 hours. The 5FU chemo will infuse for 24 hours total. Then the pump will be removed).       MEDICATIONS: You have been given prescriptions for the following medications:   Dexamethasone 4mg  tablet. Start the day after chemo for 2 days.  Take 2 tablets (8mg ) with a meal.  Zofran/Ondansetron 8mg  tablet. Take 1 tablet every 8 hours as needed for nausea/vomiting. (#1 nausea med to take, this can constipate)  Compazine/Prochlorperazine 10mg  tablet. Take 1 tablet every 6 hours as needed for nausea/vomiting. (#2 nausea med to take, this can make you sleepy)   EMLA cream. Apply a quarter size amount to port site 1 hour prior to chemo. Do not rub in. Cover with plastic wrap.   Over-the-Counter Meds:  Miralax 1 capful in 8 oz of fluid daily. May increase to two times a day if needed. This is a stool softener. If this doesn't work proceed you can add:  Senokot S  - start with 1 tablet two times a day and increase to 4 tablets two times a day if needed. (total of 8 tablets in a 24 hour period). This is a stimulant laxative.   Call us if this does not help your bowels move.   Imodium 2mg  capsule. Take 2  capsules after the 1st loose stool and then 1 capsule every 2 hours until you go a total of 12 hours without having a loose stool. Call the [facility] if loose stools continue. If diarrhea occurs @ bedtime, take 2 capsules @ bedtime. Then take 2 capsules every 4 hours until morning. Call [facility].      (Please refer to/review other teaching materials that have been provided to you in this blue folder - What to Know During Chemo, What to know After Chemo, Dr. Donald Pore Advice, Constipation Sheet, Diarrhea Sheet, Nausea Sheet, Self Care Activities While on Chemo)      SYMPTOMS TO REPORT AS SOON AS POSSIBLE AFTER TREATMENT:  FEVER GREATER THAN 100.5 F  CHILLS WITH OR WITHOUT FEVER  NAUSEA AND VOMITING THAT IS NOT CONTROLLED WITH YOUR NAUSEA MEDICATION  UNUSUAL SHORTNESS OF BREATH  UNUSUAL BRUISING OR BLEEDING  TENDERNESS IN MOUTH AND THROAT WITH OR WITHOUT PRESENCE OF ULCERS  URINARY PROBLEMS  BOWEL PROBLEMS  UNUSUAL RASH    Wear comfortable clothing and clothing appropriate for easy access to any Portacath or PICC line. Let us know if there is anything that we can do to make your therapy better!      I have been informed and understand all of the instructions given to me and have received a copy. I have been instructed to call the clinic [phone])  or my family physician as soon as possible for continued medical care, if indicated. I do not have any more questions at this time but understand that I may call the [facility] at [phone] during office hours should I have questions or need assistance in obtaining follow-up care.

## 2016-04-29 ENCOUNTER — Encounter (HOSPITAL_COMMUNITY): Payer: Medicare Other | Attending: Oncology

## 2016-04-29 ENCOUNTER — Ambulatory Visit (HOSPITAL_COMMUNITY): Payer: Medicare Other

## 2016-04-29 ENCOUNTER — Encounter (HOSPITAL_BASED_OUTPATIENT_CLINIC_OR_DEPARTMENT_OTHER): Payer: Medicare Other | Admitting: Oncology

## 2016-04-29 VITALS — BP 162/77 | HR 98 | Temp 98.4°F | Resp 20 | Wt 257.0 lb

## 2016-04-29 DIAGNOSIS — Z5111 Encounter for antineoplastic chemotherapy: Secondary | ICD-10-CM | POA: Diagnosis not present

## 2016-04-29 DIAGNOSIS — C180 Malignant neoplasm of cecum: Secondary | ICD-10-CM | POA: Insufficient documentation

## 2016-04-29 DIAGNOSIS — N184 Chronic kidney disease, stage 4 (severe): Secondary | ICD-10-CM

## 2016-04-29 DIAGNOSIS — Z5112 Encounter for antineoplastic immunotherapy: Secondary | ICD-10-CM | POA: Diagnosis not present

## 2016-04-29 DIAGNOSIS — Z992 Dependence on renal dialysis: Secondary | ICD-10-CM

## 2016-04-29 LAB — COMPREHENSIVE METABOLIC PANEL
ALBUMIN: 3.6 g/dL (ref 3.5–5.0)
ALT: 14 U/L — ABNORMAL LOW (ref 17–63)
ANION GAP: 7 (ref 5–15)
AST: 19 U/L (ref 15–41)
Alkaline Phosphatase: 55 U/L (ref 38–126)
BUN: 21 mg/dL — ABNORMAL HIGH (ref 6–20)
CHLORIDE: 100 mmol/L — AB (ref 101–111)
CO2: 30 mmol/L (ref 22–32)
Calcium: 8.6 mg/dL — ABNORMAL LOW (ref 8.9–10.3)
Creatinine, Ser: 4.58 mg/dL — ABNORMAL HIGH (ref 0.61–1.24)
GFR calc non Af Amer: 13 mL/min — ABNORMAL LOW (ref >60)
GFR, EST AFRICAN AMERICAN: 15 mL/min — AB (ref >60)
Glucose, Bld: 122 mg/dL — ABNORMAL HIGH (ref 65–99)
Potassium: 3.9 mmol/L (ref 3.5–5.1)
SODIUM: 137 mmol/L (ref 135–145)
Total Bilirubin: 0.2 mg/dL — ABNORMAL LOW (ref 0.3–1.2)
Total Protein: 7.1 g/dL (ref 6.5–8.1)

## 2016-04-29 LAB — CBC WITH DIFFERENTIAL/PLATELET
BASOS PCT: 0 %
Basophils Absolute: 0 10*3/uL (ref 0.0–0.1)
EOS ABS: 0.2 10*3/uL (ref 0.0–0.7)
EOS PCT: 4 %
HCT: 25.8 % — ABNORMAL LOW (ref 39.0–52.0)
Hemoglobin: 8.3 g/dL — ABNORMAL LOW (ref 13.0–17.0)
LYMPHS ABS: 1.1 10*3/uL (ref 0.7–4.0)
Lymphocytes Relative: 21 %
MCH: 33.2 pg (ref 26.0–34.0)
MCHC: 32.2 g/dL (ref 30.0–36.0)
MCV: 103.2 fL — ABNORMAL HIGH (ref 78.0–100.0)
MONOS PCT: 10 %
Monocytes Absolute: 0.5 10*3/uL (ref 0.1–1.0)
Neutro Abs: 3.4 10*3/uL (ref 1.7–7.7)
Neutrophils Relative %: 65 %
PLATELETS: 242 10*3/uL (ref 150–400)
RBC: 2.5 MIL/uL — AB (ref 4.22–5.81)
RDW: 16.4 % — ABNORMAL HIGH (ref 11.5–15.5)
WBC: 5.2 10*3/uL (ref 4.0–10.5)

## 2016-04-29 LAB — MAGNESIUM: Magnesium: 2.6 mg/dL — ABNORMAL HIGH (ref 1.7–2.4)

## 2016-04-29 MED ORDER — FLUOROURACIL CHEMO INJECTION 2.5 GM/50ML
300.0000 mg/m2 | Freq: Once | INTRAVENOUS | Status: AC
  Administered 2016-04-29: 750 mg via INTRAVENOUS
  Filled 2016-04-29: qty 15

## 2016-04-29 MED ORDER — SODIUM CHLORIDE 0.9 % IV SOLN
Freq: Once | INTRAVENOUS | Status: AC
  Administered 2016-04-29: 12:00:00 via INTRAVENOUS

## 2016-04-29 MED ORDER — IRINOTECAN HCL CHEMO INJECTION 100 MG/5ML
90.0000 mg/m2 | Freq: Once | INTRAVENOUS | Status: AC
  Administered 2016-04-29: 220 mg via INTRAVENOUS
  Filled 2016-04-29: qty 11

## 2016-04-29 MED ORDER — PALONOSETRON HCL INJECTION 0.25 MG/5ML
INTRAVENOUS | Status: AC
  Filled 2016-04-29: qty 5

## 2016-04-29 MED ORDER — PALONOSETRON HCL INJECTION 0.25 MG/5ML
0.2500 mg | Freq: Once | INTRAVENOUS | Status: AC
  Administered 2016-04-29: 0.25 mg via INTRAVENOUS

## 2016-04-29 MED ORDER — LEUCOVORIN CALCIUM INJECTION 350 MG
400.0000 mg/m2 | Freq: Once | INTRAVENOUS | Status: AC
  Administered 2016-04-29: 972 mg via INTRAVENOUS
  Filled 2016-04-29: qty 48.6

## 2016-04-29 MED ORDER — DIPHENHYDRAMINE HCL 50 MG/ML IJ SOLN
50.0000 mg | Freq: Once | INTRAMUSCULAR | Status: AC
  Administered 2016-04-29: 50 mg via INTRAVENOUS

## 2016-04-29 MED ORDER — CETUXIMAB CHEMO IV INJECTION 200 MG/100ML
500.0000 mg/m2 | Freq: Once | INTRAVENOUS | Status: AC
  Administered 2016-04-29: 1200 mg via INTRAVENOUS
  Filled 2016-04-29: qty 100

## 2016-04-29 MED ORDER — SODIUM CHLORIDE 0.9% FLUSH: 10.0000 mL | INTRAVENOUS | Status: DC | PRN

## 2016-04-29 MED ORDER — SODIUM CHLORIDE 0.9 % IV SOLN
10.0000 mg | Freq: Once | INTRAVENOUS | Status: AC
  Administered 2016-04-29: 10 mg via INTRAVENOUS
  Filled 2016-04-29: qty 1

## 2016-04-29 MED ORDER — DIPHENHYDRAMINE HCL 50 MG/ML IJ SOLN
INTRAMUSCULAR | Status: AC
  Filled 2016-04-29: qty 1

## 2016-04-29 MED ORDER — SODIUM CHLORIDE 0.9 % IV SOLN
1500.0000 mg/m2 | INTRAVENOUS | Status: DC
  Administered 2016-04-29: 3650 mg via INTRAVENOUS
  Filled 2016-04-29: qty 73

## 2016-04-29 NOTE — Progress Notes (Signed)
Chemotherapy teaching.  Consent signed.

## 2016-04-29 NOTE — Patient Instructions (Signed)
[place] at [facility] Discharge Instructions  RECOMMENDATIONS MADE BY THE CONSULTANT AND ANY TEST RESULTS WILL BE SENT TO YOUR REFERRING PHYSICIAN.  Return to center as schedule.  Thank you for choosing [place] at [facility] to provide your oncology and hematology care.  To afford each patient quality time with our provider, please arrive at least 15 minutes before your scheduled appointment time.   Beginning January 23rd 2017 lab work for the [name] will be done in the  Main lab at [facility] on 1st floor. If you have a lab appointment with the [facility] please come in thru the  Main Entrance and check in at the main information desk  You need to re-schedule your appointment should you arrive 10 or more minutes late.  We strive to give you quality time with our providers, and arriving late affects you and other patients whose appointments are after yours.  Also, if you no show three or more times for appointments you may be dismissed from the clinic at the providers discretion.     Again, thank you for choosing [facility].  Our hope is that these requests will decrease the amount of time that you wait before being seen by our physicians.       _____________________________________________________________  Should you have questions after your visit to [facility], please contact our office at [phone] between the hours of 8:30 a.m. and 4:30 p.m.  Voicemails left after 4:30 p.m. will not be returned until the following business day.  For prescription refill requests, have your pharmacy contact our office.         Resources For Cancer Patients and their Caregivers ? American Cancer Society: Can assist with transportation, wigs, general needs, runs Look Good Feel Better.        ([phone] ? Cancer Care: Provides financial assistance, online support groups, medication/co-pay assistance.   1-800-813-HOPE 616-762-0388) ? [facility] Assists [place] cancer patients and their families through emotional , educational and financial support.  (540) [phone] ? [name] DSS Where to apply for food stamps, Medicaid and utility assistance. 340-353-5042 ? [name]: Transportation to medical appointments. 256-447-9795 ? Social Security Administration: May apply for disability if have a Stage IV cancer. 940-622-2114 (270)373-1938 ? [employer], Disability and Transit Services: Assists with nutrition, care and transit needs. [place] Support Programs: @10RELATIVEDAYS @ > Cancer Support Group  2nd Tuesday of the month 1pm-2pm, [name]  > Creative Journey  3rd Tuesday of the month 1130am-1pm, [name]  > Look Good Feel Better  1st Wednesday of the month 10am-12 noon, [name] (Call [facility] to register [phone])

## 2016-04-29 NOTE — Assessment & Plan Note (Addendum)
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  Now on FOLFIRI + Erbitux  Oncology history updated.  Treatment plan built consisting of FOLFIRI + Erbitux with dose reductions as recommended for hemodialysis patient.  DNR.    Labs today: CBC diff, CMET, Magnesium, and CEA.  I personally reviewed and went over laboratory results with the patient.  The results are noted within this dictation. Laboratory work satisfies treatment criteria.  Hemoglobin is noted to decline. He is on hemodialysis and therefore getting ESA therapy from that perspective.  I have reviewed the risks, benefits, alternatives, and side effects of the new medications that were added to his treatment plan including, but not limited to, diarrhea and anaphylaxis.  Return in 2 weeks for treatment and follow-up.

## 2016-04-29 NOTE — Progress Notes (Signed)
Nathaniel Johnston tolerated chemo tx well without complaints.Pt was given information on new chemo medications given today. Pt discharged with 5FU pump infusing without issues. Pt discharged via wheelchair in satisfactory condition with friend

## 2016-04-29 NOTE — Progress Notes (Signed)
No PCP Per Patient No address on file  Adenocarcinoma of cecum ([place])  CURRENT THERAPY: FOLFIRI + Erbitux beginning on 04/29/2016  INTERVAL HISTORY: Nathaniel Johnston 60 y.o. 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    Adenocarcinoma of cecum ([place])   06/19/2014 Pathology Results    Biopsy of right colon mass positive for adenocarcinoma. He had multiple other tubular adenomas and an occasional tubulovillous adenomas seen. Cancer is K-ras mutation negative (wild type).     07/19/2014 Procedure    Colonoscopy     07/19/2014 Pathology Results    Right colon mass- invasive adenocarcinoma     07/25/2014 Surgery    Right hemicolectomy performed by Dr. Ladona Horns     07/25/2014 Pathology Results    mucinous adenocarcinoma, 6 cm in maximum dimension, arising from the cecum. positive for 19/25 nodes, +LVI, focal neural invasion, poorly differentiated with numerous extramural nodules, greater than 10     08/10/2014 Pathology Results    KRAS/NRAS mutation in negative.  No KRAS or NRAS mutations were detected in exon 2, 3, and 4.     08/29/2014 - 02/20/2015 Chemotherapy    FOLFOX adjusted for renal failure and on dialysis, 12 cycles     04/02/2015 Imaging    CT abdomen pelvis-interval resolution of previously described anterior abdominal wall abscess. Ventral abdominal wall wound is predominantly healed. Small fat containing ventral abdominal wall hernia. NED.     08/18/2015 Progression         08/18/2015 PET scan    Prominent recurrence with extensive retroperitoneal hypermetabolic adenopathy, extensive adenopathy and nodularity in the right perirenal space and tracking in the right peritoneum. Hypermetabolic mass along the bowel anastomotic site.      09/04/2015 -  Chemotherapy    FOLFOX chemotherapy reinitiated for recurrent disease.     11/28/2015 Imaging    CT abdomen and pelvis-persistent nodularity along the right  pararenal space, reduction in retroperitoneal lymphadenopathy, persistent enlarged retroperitoneal lymph node adjacent to the right kidney, no evidence of disease progression or liver metastases.     03/11/2016 Miscellaneous    Transfer of medical oncology care to [facility] (from [facility])     03/11/2016 Code Status    DNR     03/24/2016 Imaging    CT CAP- Mild increase in abdominal retroperitoneal lymphadenopathy since previous study. New mild right hydronephrosis also noted. No significant change in retroperitoneal soft tissue nodularity in right pararenal space, consistent with metastatic disease     04/29/2016 Treatment Plan Change    Change in therapy     04/29/2016 -  Chemotherapy    FOLFIRI + Erbitux     He denies any complaints today.  Review of Systems  Constitutional: Negative.  Negative for chills, fever and weight loss.  HENT: Negative.   Eyes: Negative.   Respiratory: Negative.   Cardiovascular: Positive for leg swelling (Stable, chronic).  Gastrointestinal: Negative for abdominal pain, blood in stool, constipation, diarrhea, melena, nausea and vomiting.  Genitourinary: Negative.  Negative for dysuria and hematuria.  Musculoskeletal: Negative.   Skin: Negative.   Neurological: Negative.   Endo/Heme/Allergies: Negative.   Psychiatric/Behavioral: Negative.     Past Medical History:  Diagnosis Date  . Adenocarcinoma of cecum ([place]) 03/08/2016  . Cancer ([place]) 07/2014   colon cancer surgery.  Finished Chemo 02/2015  . Chronic kidney disease   . Diabetes mellitus  without complication ([place])   . DNR (do not resuscitate) 03/11/2016  . ESRD (end stage renal disease) ([place]) 03/11/2016  . GERD (gastroesophageal reflux disease)   . High cholesterol   . Hypertension   . Stroke [facility])    2003    Past Surgical History:  Procedure Laterality Date  . AMPUTATION TOE Left 2001  . CATARACT EXTRACTION W/PHACO Right 06/16/2015   Procedure: CATARACT  EXTRACTION PHACO AND INTRAOCULAR LENS PLACEMENT RIGHT EYE CDE=54.69;  Surgeon: Williams Che, MD;  Location: [facility];  Service: Ophthalmology;  Laterality: Right;  . COLECTOMY  07/2014  . DIALYSIS FISTULA CREATION Right   . toe amputation Right 2000  . YAG LASER APPLICATION Right 79/89/2119   Procedure: YAG LASER APPLICATION;  Surgeon: Williams Che, MD;  Location: [facility];  Service: Ophthalmology;  Laterality: Right;    No family history on file.  Social History   Social History  . Marital status: Married    Spouse name: N/A  . Number of children: N/A  . Years of education: N/A   Occupational History  . [employer]     [employer]   Social History Main Topics  . Smoking status: Former Smoker    Packs/day: 0.50    Years: 15.00    Types: Cigarettes    Quit date: 03/12/1991  . Smokeless tobacco: Not on file  . Alcohol use 3.0 oz/week    5 Shots of liquor per week  . Drug use: No  . Sexual activity: Not on file   Other Topics Concern  . Not on file   Social History Narrative  . No narrative on file     PHYSICAL EXAMINATION  ECOG PERFORMANCE STATUS: 2 - Symptomatic, <50% confined to bed  There were no vitals filed for this visit.  BP 162/77 P 98 T 98.4 R 20  GENERAL:alert, no distress, well nourished, well developed, comfortable, cooperative, obese, smiling and in chemo-bed, unaccompanied during exam. SKIN: skin color, texture, turgor are normal, no rashes or significant lesions HEAD: Normocephalic, No masses, lesions, tenderness or abnormalities EYES: normal EARS: External ears normal OROPHARYNX:mucous membranes are moist  NECK: supple, trachea midline LYMPH:  no palpable lymphadenopathy BREAST:not examined LUNGS: clear to auscultation  HEART: regular rate & rhythm ABDOMEN:abdomen soft and normal bowel sounds BACK: Back symmetric, no curvature. EXTREMITIES:less then 2 second capillary refill, no joint deformities, effusion, or inflammation,  no skin discoloration, no cyanosis  NEURO: alert & oriented x 3 with fluent speech, no focal motor/sensory deficits, brought to the clinic in a wheelchair.   LABORATORY DATA: CBC    Component Value Date/Time   WBC 5.2 04/29/2016 1124   RBC 2.50 (L) 04/29/2016 1124   HGB 8.3 (L) 04/29/2016 1124   HCT 25.8 (L) 04/29/2016 1124   PLT 242 04/29/2016 1124   MCV 103.2 (H) 04/29/2016 1124   MCH 33.2 04/29/2016 1124   MCHC 32.2 04/29/2016 1124   RDW 16.4 (H) 04/29/2016 1124   LYMPHSABS 1.1 04/29/2016 1124   MONOABS 0.5 04/29/2016 1124   EOSABS 0.2 04/29/2016 1124   BASOSABS 0.0 04/29/2016 1124      Chemistry      Component Value Date/Time   NA 137 04/29/2016 1124   K 3.9 04/29/2016 1124   CL 100 (L) 04/29/2016 1124   CO2 30 04/29/2016 1124   BUN 21 (H) 04/29/2016 1124   CREATININE 4.58 (H) 04/29/2016 1124      Component Value Date/Time   CALCIUM 8.6 (L) 04/29/2016  1124   ALKPHOS 55 04/29/2016 1124   AST 19 04/29/2016 1124   ALT 14 (L) 04/29/2016 1124   BILITOT 0.2 (L) 04/29/2016 1124        PENDING LABS:   RADIOGRAPHIC STUDIES:  No results found.   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  Now on FOLFIRI + Erbitux  Oncology history updated.  Treatment plan built consisting of FOLFIRI + Erbitux with dose reductions as recommended for hemodialysis patient.  DNR.    Labs today: CBC diff, CMET, Magnesium, and CEA.  I personally reviewed and went over laboratory results with the patient.  The results are noted within this dictation. Laboratory work satisfies treatment criteria.  Hemoglobin is noted to decline. He is on hemodialysis and therefore getting ESA therapy from that perspective.  I have reviewed the risks, benefits,  alternatives, and side effects of the new medications that were added to his treatment plan including, but not limited to, diarrhea and anaphylaxis.  Return in 2 weeks for treatment and follow-up.   ORDERS PLACED FOR THIS ENCOUNTER: No orders of the defined types were placed in this encounter.   MEDICATIONS PRESCRIBED THIS ENCOUNTER: Meds ordered this encounter  Medications  . HYDROcodone-acetaminophen (NORCO/VICODIN) 5-325 MG tablet    THERAPY PLAN:  Continue palliative treatment as outlined above.  All questions were answered. The patient knows to call the clinic with any problems, questions or concerns. We can certainly see the patient much sooner if necessary.  Patient and plan discussed with Dr. Ancil Linsey and she is in agreement with the aforementioned.   This note is electronically signed by: Doy Mince 04/29/2016 9:59 PM

## 2016-04-30 ENCOUNTER — Encounter (HOSPITAL_BASED_OUTPATIENT_CLINIC_OR_DEPARTMENT_OTHER): Payer: Medicare Other

## 2016-04-30 ENCOUNTER — Encounter (HOSPITAL_COMMUNITY): Payer: Medicare Other

## 2016-04-30 DIAGNOSIS — C180 Malignant neoplasm of cecum: Secondary | ICD-10-CM

## 2016-04-30 LAB — CEA: CEA: 220.4 ng/mL — ABNORMAL HIGH (ref 0.0–4.7)

## 2016-04-30 MED ORDER — HEPARIN SOD (PORK) LOCK FLUSH 100 UNIT/ML IV SOLN
INTRAVENOUS | Status: AC
  Filled 2016-04-30: qty 5

## 2016-04-30 MED ORDER — HEPARIN SOD (PORK) LOCK FLUSH 100 UNIT/ML IV SOLN
500.0000 [IU] | Freq: Once | INTRAVENOUS | Status: AC | PRN
  Administered 2016-04-30: 500 [IU]

## 2016-04-30 MED ORDER — SODIUM CHLORIDE 0.9% FLUSH
10.0000 mL | INTRAVENOUS | Status: DC | PRN
  Administered 2016-04-30: 10 mL
  Filled 2016-04-30: qty 10

## 2016-04-30 NOTE — Progress Notes (Signed)
24 hour f/u post new therapy with irinotecan - denies any symptoms presently, but states that he did feel hot and weak briefly this morning. States it did not last long and he feels as he usually does now.  Nathaniel Johnston presents to have home infusion pump d/c'd and for port-a-cath deaccess with flush.  Portacath located left chest wall accessed with  H 20 needle.  Good blood return present. Portacath flushed with NS and 500U/72ml Heparin, and needle removed intact.  Procedure tolerated well and without incident.

## 2016-04-30 NOTE — Patient Instructions (Signed)
[facility] Discharge Instructions  RECOMMENDATIONS MADE BY THE CONSULTANT AND ANY TEST RESULTS WILL BE SENT TO YOUR REFERRING PHYSICIAN.    Thank you for choosing [facility] at [facility] to provide your oncology and hematology care.  To afford each patient quality time with our provider, please arrive at least 15 minutes before your scheduled appointment time.   Beginning January 23rd 2017 lab work for the Cancer Center will be done in the  Main lab at [facility] on 1st floor. If you have a lab appointment with the Cancer Center please come in thru the  Main Entrance and check in at the main information desk  You need to re-schedule your appointment should you arrive 10 or more minutes late.  We strive to give you quality time with our providers, and arriving late affects you and other patients whose appointments are after yours.  Also, if you no show three or more times for appointments you may be dismissed from the clinic at the providers discretion.     Again, thank you for choosing [facility].  Our hope is that these requests will decrease the amount of time that you wait before being seen by our physicians.       _____________________________________________________________  Should you have questions after your visit to [facility], please contact our office at [phone] between the hours of 8:30 a.m. and 4:30 p.m.  Voicemails left after 4:30 p.m. will not be returned until the following business day.  For prescription refill requests, have your pharmacy contact our office.         Resources For Cancer Patients and their Caregivers ? American Cancer Society: Can assist with transportation, wigs, general needs, runs Look Good Feel Better.        1-888-227-6333 ? Cancer Care: Provides financial assistance, online support groups, medication/co-pay assistance.  1-800-813-HOPE (4673) ? [facility] Assists [name] cancer patients and their families through emotional , educational and financial support.  336-427-4357 ? [name] DSS Where to apply for food stamps, Medicaid and utility assistance. 336-342-1394 ? [name]: Transportation to medical appointments. 336-347-2287 ? Social Security Administration: May apply for disability if have a Stage IV cancer. 336-342-7796 1-800-772-1213 ? [name] Aging, Disability and Transit Services: Assists with nutrition, care and transit needs. 336-349-2343  Cancer Center Support Programs: @10RELATIVEDAYS@ > Cancer Support Group  2nd Tuesday of the month 1pm-2pm, [name]  > Creative Journey  3rd Tuesday of the month 1130am-1pm, [name]  > Look Good Feel Better  1st Wednesday of the month 10am-12 noon, [name] (Call American Cancer Society to register 1-800-395-5775)   

## 2016-05-01 ENCOUNTER — Emergency Department (HOSPITAL_COMMUNITY): Payer: Medicare Other

## 2016-05-01 ENCOUNTER — Emergency Department (HOSPITAL_COMMUNITY)
Admission: EM | Admit: 2016-05-01 | Discharge: 2016-05-01 | Disposition: A | Discharge status: Home / Self Care | Payer: Medicare Other | Attending: Emergency Medicine | Admitting: Emergency Medicine

## 2016-05-01 ENCOUNTER — Encounter (HOSPITAL_COMMUNITY): Payer: Self-pay

## 2016-05-01 DIAGNOSIS — I129 Hypertensive chronic kidney disease with stage 1 through stage 4 chronic kidney disease, or unspecified chronic kidney disease: Secondary | ICD-10-CM | POA: Diagnosis not present

## 2016-05-01 DIAGNOSIS — T82598A Other mechanical complication of other cardiac and vascular devices and implants, initial encounter: Secondary | ICD-10-CM | POA: Insufficient documentation

## 2016-05-01 DIAGNOSIS — Z7984 Long term (current) use of oral hypoglycemic drugs: Secondary | ICD-10-CM | POA: Diagnosis not present

## 2016-05-01 DIAGNOSIS — Z87891 Personal history of nicotine dependence: Secondary | ICD-10-CM | POA: Diagnosis not present

## 2016-05-01 DIAGNOSIS — Z85038 Personal history of other malignant neoplasm of large intestine: Secondary | ICD-10-CM | POA: Insufficient documentation

## 2016-05-01 DIAGNOSIS — Z79899 Other long term (current) drug therapy: Secondary | ICD-10-CM | POA: Insufficient documentation

## 2016-05-01 DIAGNOSIS — E1122 Type 2 diabetes mellitus with diabetic chronic kidney disease: Secondary | ICD-10-CM | POA: Insufficient documentation

## 2016-05-01 DIAGNOSIS — Z992 Dependence on renal dialysis: Secondary | ICD-10-CM | POA: Insufficient documentation

## 2016-05-01 DIAGNOSIS — T82590A Other mechanical complication of surgically created arteriovenous fistula, initial encounter: Secondary | ICD-10-CM

## 2016-05-01 DIAGNOSIS — Y712 Prosthetic and other implants, materials and accessory cardiovascular devices associated with adverse incidents: Secondary | ICD-10-CM | POA: Insufficient documentation

## 2016-05-01 DIAGNOSIS — R609 Edema, unspecified: Secondary | ICD-10-CM

## 2016-05-01 DIAGNOSIS — N189 Chronic kidney disease, unspecified: Secondary | ICD-10-CM | POA: Diagnosis not present

## 2016-05-01 LAB — BASIC METABOLIC PANEL
ANION GAP: 13 (ref 5–15)
BUN: 59 mg/dL — ABNORMAL HIGH (ref 6–20)
CHLORIDE: 96 mmol/L — AB (ref 101–111)
CO2: 27 mmol/L (ref 22–32)
Calcium: 9.3 mg/dL (ref 8.9–10.3)
Creatinine, Ser: 7.38 mg/dL — ABNORMAL HIGH (ref 0.61–1.24)
GFR calc Af Amer: 8 mL/min — ABNORMAL LOW (ref >60)
GFR, EST NON AFRICAN AMERICAN: 7 mL/min — AB (ref >60)
GLUCOSE: 187 mg/dL — AB (ref 65–99)
POTASSIUM: 5.2 mmol/L — AB (ref 3.5–5.1)
Sodium: 136 mmol/L (ref 135–145)

## 2016-05-01 LAB — PHOSPHORUS: Phosphorus: 4.7 mg/dL — ABNORMAL HIGH (ref 2.5–4.6)

## 2016-05-01 LAB — MAGNESIUM: Magnesium: 3.5 mg/dL — ABNORMAL HIGH (ref 1.7–2.4)

## 2016-05-01 NOTE — Discharge Instructions (Signed)
Come back to the ER tomorrow at 10:45 - Dr. Rosana Hoes will be able to repair your fistula and you will get dialysis - if you feel increased shortness of breath or weakness, come back immediately.

## 2016-05-01 NOTE — ED Notes (Signed)
Dr. Rosana Hoes in room with pt

## 2016-05-01 NOTE — ED Notes (Signed)
MD at bedside. 

## 2016-05-01 NOTE — ED Provider Notes (Signed)
[facility] DEPT Provider Note   CSN: [id number] Arrival date & time: 05/01/16  1153  First Provider Contact:  None       History   Chief Complaint Chief Complaint  Patient presents with  . Vascular Access Problem    HPI Nathaniel Johnston is a 60 y.o. male.  HPI  The patient is a 60 year old male, he has a known history of colon cancer, he has finished chemotherapy last year, he has chronic kidney disease and is on dialysis as well as being a diabetic. He has his current access in his right arm, when he went to dialysis today (completed dialysis on Thursday) they were unable to perform dialysis because of dysfunction of his fistula. The patient has no complains, no shortness of breath, no pain, no weakness, chronic swelling of his left lower extremity. Dr. Rosana Hoes is also present at the bedside to evaluate the patient from a surgical standpoint  Past Medical History:  Diagnosis Date  . Adenocarcinoma of cecum ([place]) 03/08/2016  . Cancer ([place]) 07/2014   colon cancer surgery.  Finished Chemo 02/2015  . Chronic kidney disease   . Diabetes mellitus without complication ([place])   . DNR (do not resuscitate) 03/11/2016  . ESRD (end stage renal disease) ([place]) 03/11/2016  . GERD (gastroesophageal reflux disease)   . High cholesterol   . Hypertension   . Stroke [facility])    2003    Patient Active Problem List   Diagnosis Date Noted  . Diarrhea 04/01/2016  . DNR (do not resuscitate) 03/11/2016  . ESRD (end stage renal disease) ([place]) 03/11/2016  . 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 High cholesterol     Past Surgical History:  Procedure Laterality Date  . AMPUTATION TOE Left 2001  . CATARACT EXTRACTION W/PHACO Right 06/16/2015   Procedure: CATARACT EXTRACTION PHACO AND INTRAOCULAR LENS PLACEMENT RIGHT EYE CDE=54.69;  Surgeon: Williams Che, MD;  Location: [facility];  Service: Ophthalmology;  Laterality: Right;  . COLECTOMY  07/2014  . DIALYSIS FISTULA CREATION Right   . toe  amputation Right 2000  . YAG LASER APPLICATION Right [id number]   Procedure: YAG LASER APPLICATION;  Surgeon: Williams Che, MD;  Location: [facility];  Service: Ophthalmology;  Laterality: Right;       Home Medications    Prior to Admission medications   Medication Sig Start Date End Date Taking? Authorizing Provider  amLODipine (NORVASC) 10 MG tablet Take 2 tablets by mouth daily.  07/29/14  Yes Historical Provider, MD  calcium acetate (PHOSLO) 667 MG capsule Take 1 capsule by mouth 3 (three) times daily with meals.  07/15/14  Yes Historical Provider, MD  glipiZIDE (GLUCOTROL) 10 MG tablet Take 1 tablet by mouth. 07/01/14  Yes Historical Provider, MD  HYDROcodone-acetaminophen (NORCO/VICODIN) 5-325 MG tablet  02/21/16  Yes Historical Provider, MD  labetalol (NORMODYNE) 300 MG tablet Take 2 tablets by mouth. 06/02/10  Yes Historical Provider, MD  potassium chloride SA (K-DUR,KLOR-CON) 20 MEQ tablet Take 1 tablet (20 mEq total) by mouth daily. 04/06/16  Yes Baird Cancer, PA-C  sevelamer carbonate (RENVELA) 800 MG tablet Take 800 mg by mouth 3 (three) times daily with meals.  07/30/14  Yes Historical Provider, MD  Cetuximab (ERBITUX IV) Inject into the vein. Every 2 weeks    Historical Provider, MD  dexamethasone (DECADRON) 4 MG tablet Take 2 tablets (8 mg total) by mouth daily. Start the day after chemo for 2 days. 04/27/16   Patrici Ranks, MD  fluorouracil CALGB 32440 in sodium chloride 0.9 % 150 mL Inject into the vein over 24 hr.    Historical Provider, MD  irinotecan in dextrose 5 % 500 mL Inject into the vein once. Every 2 weeks    Historical Provider, MD  Iron Polysacch Cmplx-B12-FA 150-0.025-1 MG CAPS Take 1 capsule by mouth. 08/27/14   Historical Provider, MD  LEUCOVORIN CALCIUM IJ Inject as directed. Every 2 weeks    Historical Provider, MD  lisinopril (PRINIVIL,ZESTRIL) 40 MG tablet Take 1 tablet by mouth. 06/02/10   Historical Provider, MD  loperamide (IMODIUM A-D) 2 MG tablet Take 2  at diarrhea onset , then 1 every 2hr until 12hrs with no BM. May take 2 every 4hrs at night. If diarrhea recurs repeat. 04/27/16   Patrici Ranks, MD  loperamide (IMODIUM) 2 MG capsule Take 2 capsules after the first loose stool and then 1 capsule every 2 hours until you go a total of 12 hours without having a loose stool. If it is bedtime and you are having loose stools take 2 capsules every 4 hours until morning. Call [place]. 04/01/16   Patrici Ranks, MD  ondansetron (ZOFRAN) 8 MG tablet Take 1 tablet (8 mg total) by mouth 2 (two) times daily as needed for refractory nausea / vomiting. Start on day 3 after chemotherapy. 04/27/16   Patrici Ranks, MD  prochlorperazine (COMPAZINE) 10 MG tablet Take 1 tablet (10 mg total) by mouth every 6 (six) hours as needed (NAUSEA). 04/27/16   Patrici Ranks, MD  simvastatin (ZOCOR) 80 MG tablet Take 1 tablet by mouth. 08/17/14   Historical Provider, MD  Skin Protectants, Misc. (EUCERIN) cream Apply topically. 08/29/14   Historical Provider, MD  triamcinolone cream (KENALOG) 0.1 % Apply topically. 06/05/14   Historical Provider, MD    Family History No family history on file.  Social History Social History  Substance Use Topics  . Smoking status: Former Smoker    Packs/day: 0.50    Years: 15.00    Types: Cigarettes    Quit date: 03/12/1991  . Smokeless tobacco: Never Used  . Alcohol use 3.0 oz/week    5 Shots of liquor per week     Allergies   Review of patient's allergies indicates no known allergies.   Review of Systems Review of Systems  All other systems reviewed and are negative.    Physical Exam Updated Vital Signs BP 151/71 (BP Location: Left Arm)   Pulse 74   Temp 98.1 F (36.7 C) (Oral)   Resp 18   Ht 6\' 1"  (1.854 m)   Wt 252 lb (114.3 kg)   SpO2 95%   BMI 33.25 kg/m   Physical Exam  Constitutional: He appears well-developed and well-nourished. No distress.  HENT:  Head: Normocephalic and atraumatic.    Mouth/Throat: Oropharynx is clear and moist. No oropharyngeal exudate.  Eyes: Conjunctivae and EOM are normal. Pupils are equal, round, and reactive to light. Right eye exhibits no discharge. Left eye exhibits no discharge. No scleral icterus.  Neck: Normal range of motion. Neck supple. No JVD present. No thyromegaly present.  Cardiovascular: Normal rate, regular rhythm and intact distal pulses.  Exam reveals no gallop and no friction rub.   Murmur heard. Pulmonary/Chest: Effort normal and breath sounds normal. No respiratory distress. He has no wheezes. He has no rales.  No rales  Abdominal: Soft. Bowel sounds are normal. He exhibits no distension and no mass. There is no tenderness.  No abdominal  tenderness  Musculoskeletal: Normal range of motion. He exhibits edema ( Edema of the left lower extremity greater than the right lower extremity (patient states this is baseline) ). He exhibits no tenderness.  Lymphadenopathy:    He has no cervical adenopathy.  Neurological: He is alert. Coordination normal.  Skin: Skin is warm and dry. No rash noted. No erythema.  Psychiatric: He has a normal mood and affect. His behavior is normal.  Nursing note and vitals reviewed.    ED Treatments / Results  Labs (all labs ordered are listed, but only abnormal results are displayed) Labs Reviewed  BASIC METABOLIC PANEL - Abnormal; Notable for the following:       Result Value   Potassium 5.2 (*)    Chloride 96 (*)    Glucose, Bld 187 (*)    BUN 59 (*)    Creatinine, Ser 7.38 (*)    GFR calc non Af Amer 7 (*)    GFR calc Af Amer 8 (*)    All other components within normal limits  MAGNESIUM - Abnormal; Notable for the following:    Magnesium 3.5 (*)    All other components within normal limits  PHOSPHORUS - Abnormal; Notable for the following:    Phosphorus 4.7 (*)    All other components within normal limits     Radiology No results found.  Procedures Procedures (including critical care  time)  Medications Ordered in ED Medications - No data to display   Initial Impression / Assessment and Plan / ED Course  I have reviewed the triage vital signs and the nursing notes.  Pertinent labs & imaging results that were available during my care of the patient were reviewed by me and considered in my medical decision making (see chart for details).  Clinical Course  Value Comment By Time   Dr. Rosana Hoes has requested a BMP - VS normal - no distress and does not feel like  he is in fluid overload. Noemi Chapel, MD 08/05 1419  Phosphorus: (!) 4.7 Will d/w Dr. Lowanda Foster - abnormal labs  Noemi Chapel, MD 08/05 1534   Has been seen by Dr. Rosana Hoes Dr. Lowanda Foster is aware of pt and rec'd dialysis tomorrow US done on fistula - Dr. Rosana Hoes aware and will have pt come back tomorrow for access and dialysis.   Labs reviewed, K not significantly elevated Noemi Chapel, MD 08/05 1851     Final Clinical Impressions(s) / ED Diagnoses   Final diagnoses:  Swelling  Dialysis AV fistula malfunction, initial encounter [facility])    New Prescriptions New Prescriptions   No medications on file     Noemi Chapel, MD 05/01/16 [id number]

## 2016-05-01 NOTE — ED Triage Notes (Signed)
Patient sent here from dialysis for unable to access fistula. Patient denies any pain. States "the administator called and wanted Dr. Rosana Hoes to look at it".

## 2016-05-01 NOTE — ED Notes (Signed)
[place] called and Nathaniel Johnston will have to do this particular [place]. ETA of 1-2 hours

## 2016-05-01 NOTE — Consult Note (Addendum)
ADDENDUM: Patient labs reviewed and discussed with Dr. Berton Bon, including K - 5.2, Mg - 3.5, and Phos - 4.7. Following RUE AVF ultrasound today, patient can be discharged home and is instructed to return tomorrow for ultrasound-guided placement of non-tunneled femoral vein hemodialysis catheter and subsequent hemodialysis (will coordinate timing of catheter placement with hemodialysis RN). This was discussed with the patient and ED physician (Dr. Sabra Heck), who all express understanding and agree with above plan as described.       SURGICAL CONSULTATION NOTE (initial)  HISTORY OF PRESENT ILLNESS (HPI):  60 y.o. Right-handed male with ESRD attributed to DM since 2010 on Tu/Th/Sa hemodialysis via RUE transposed brachial-basilic arteriovenous fistula (created 1 year ago at [facility]) presented today after hemodialysis RN at [facility] in [place], [place] was unable to access his AVF for hemodialysis today. 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 He also reports that he had a fistula angiogram prior to that surgery, but does not recall if any intervention was done at that time and thinks he's underwent prior fistula angiograms with intervention. This is patient's second AVF after the outflow of a previous Right upper extremity radial-cephalic AVF (created at [facility] in 2010) became thrombosed. Patient was told at that time the veins in his Left upper extremity are too small to be used for hemodialysis access. Patient denies any pain or numbness/tingling in his Right hand/fingers.  PAST MEDICAL HISTORY (PMH):  Past Medical History:  Diagnosis Date  . 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 Cancer ([place]) 07/2014   colon cancer surgery.  Finished Chemo 02/2015  . Chronic kidney disease   . Diabetes mellitus without complication ([place])   . DNR (do not resuscitate) 03/11/2016  . ESRD (end stage renal disease) ([place]) 03/11/2016   . GERD (gastroesophageal reflux disease)   . High cholesterol   . Hypertension   . Stroke [facility])    2003     PAST SURGICAL HISTORY [facility]):  Past Surgical History:  Procedure Laterality Date  . AMPUTATION TOE Left 2001  . CATARACT EXTRACTION W/PHACO Right 06/16/2015   Procedure: CATARACT EXTRACTION PHACO AND INTRAOCULAR LENS PLACEMENT RIGHT EYE CDE=54.69;  Surgeon: Williams Che, MD;  Location: [facility] ORS;  Service: Ophthalmology;  Laterality: Right;  . COLECTOMY  07/2014  . DIALYSIS FISTULA CREATION Right   . toe amputation Right 2000  . YAG LASER APPLICATION Right [id number]   Procedure: YAG LASER APPLICATION;  Surgeon: Williams Che, MD;  Location: [facility] ORS;  Service: Ophthalmology;  Laterality: Right;     MEDICATIONS:  Prior to Admission medications   Medication Sig Start Date End Date Taking? Authorizing Provider  amLODipine (NORVASC) 10 MG tablet Take 2 tablets by mouth. 07/29/14   Historical Provider, MD  calcium acetate (PHOSLO) 667 MG capsule Take 1 capsule by mouth. 07/15/14   Historical Provider, MD  Cetuximab (ERBITUX IV) Inject into the vein. Every 2 weeks    Historical Provider, MD  dexamethasone (DECADRON) 4 MG tablet Take 2 tablets (8 mg total) by mouth daily. Start the day after chemo for 2 days. 04/27/16   Patrici Ranks, MD  fluorouracil CALGB 09811 in sodium chloride 0.9 % 150 mL Inject into the vein over 24 hr.    Historical Provider, MD  glipiZIDE (GLUCOTROL) 10 MG tablet Take 1 tablet by mouth. 07/01/14   Historical Provider, MD  HYDROcodone-acetaminophen (NORCO/VICODIN) 5-325 MG tablet  02/21/16   Historical Provider,  MD  irinotecan in dextrose 5 % 500 mL Inject into the vein once. Every 2 weeks    Historical Provider, MD  Iron Polysacch Cmplx-B12-FA 150-0.025-1 MG CAPS Take 1 capsule by mouth. 08/27/14   Historical Provider, MD  labetalol (NORMODYNE) 300 MG tablet Take 2 tablets by mouth. 06/02/10   Historical Provider, MD  LEUCOVORIN CALCIUM IJ Inject as directed.  Every 2 weeks    Historical Provider, MD  lisinopril (PRINIVIL,ZESTRIL) 40 MG tablet Take 1 tablet by mouth. 06/02/10   Historical Provider, MD  loperamide (IMODIUM A-D) 2 MG tablet Take 2 at diarrhea onset , then 1 every 2hr until 12hrs with no BM. May take 2 every 4hrs at night. If diarrhea recurs repeat. 04/27/16   Patrici Ranks, MD  loperamide (IMODIUM) 2 MG capsule Take 2 capsules after the first loose stool and then 1 capsule every 2 hours until you go a total of 12 hours without having a loose stool. If it is bedtime and you are having loose stools take 2 capsules every 4 hours until morning. Call [place]. 04/01/16   Patrici Ranks, MD  ondansetron (ZOFRAN) 8 MG tablet Take 1 tablet (8 mg total) by mouth 2 (two) times daily as needed for refractory nausea / vomiting. Start on day 3 after chemotherapy. 04/27/16   Patrici Ranks, MD  potassium chloride SA (K-DUR,KLOR-CON) 20 MEQ tablet Take 1 tablet (20 mEq total) by mouth daily. 04/06/16   Baird Cancer, PA-C  prochlorperazine (COMPAZINE) 10 MG tablet Take 1 tablet (10 mg total) by mouth every 6 (six) hours as needed (NAUSEA). 04/27/16   Patrici Ranks, MD  sevelamer carbonate (RENVELA) 800 MG tablet Take 1 tablet by mouth. 07/30/14   Historical Provider, MD  simvastatin (ZOCOR) 80 MG tablet Take 1 tablet by mouth. 08/17/14   Historical Provider, MD  Skin Protectants, Misc. (EUCERIN) cream Apply topically. 08/29/14   Historical Provider, MD  triamcinolone cream (KENALOG) 0.1 % Apply topically. 06/05/14   Historical Provider, MD     ALLERGIES:  No Known Allergies   SOCIAL HISTORY:  Social History   Social History  . Marital status: Married    Spouse name: N/A  . Number of children: N/A  . Years of education: N/A   Occupational History  . [employer]     [employer]   Social History Main Topics  . Smoking status: Former Smoker    Packs/day: 0.50    Years: 15.00    Types: Cigarettes    Quit date: 03/12/1991  .  Smokeless tobacco: Never Used  . Alcohol use 3.0 oz/week    5 Shots of liquor per week  . Drug use: No  . Sexual activity: Not on file   Other Topics Concern  . Not on file   Social History Narrative  . No narrative on file    The patient currently resides (home / rehab facility / nursing home): Home  The patient normally is (ambulatory / bedbound): Ambulatory   FAMILY HISTORY:  No family history on file.   REVIEW OF SYSTEMS:  Constitutional: denies weight loss, fever, chills, or sweats  Eyes: denies any other vision changes, history of eye injury  ENT: denies sore throat, hearing problems  Respiratory: denies shortness of breath, wheezing  Cardiovascular: denies chest pain, palpitations  Gastrointestinal: denies abdominal pain, N/V, or diarrhea  Musculoskeletal: denies any other joint pains or cramps  Skin: denies any other rashes or skin discolorations  Neurological: denies  any other headache, dizziness, weakness  Psychiatric: denies any other depression, anxiety   All other review of systems were negative   VITAL SIGNS:  Temp:  [98.1 F (36.7 C)] 98.1 F (36.7 C) (08/05 1200) Pulse Rate:  [85] 85 (08/05 1200) Resp:  [18] 18 (08/05 1200) BP: (144)/(87) 144/87 (08/05 1200) SpO2:  [100 %] 100 % (08/05 1200) Weight:  [252 lb (114.3 kg)] 252 lb (114.3 kg) (08/05 1200)     Height: 6\' 1"  (185.4 cm) Weight: 252 lb (114.3 kg) BMI (Calculated): 33.3   INTAKE/OUTPUT:  This shift: No intake/output data recorded.  Last 2 shifts: @IOLAST2SHIFTS @   PHYSICAL EXAM:  Constitutional:  -- Normal body habitus  -- Awake, alert, and oriented x3  Eyes:  -- Pupils equally round and reactive to light  -- No scleral icterus  Ear, nose, and throat:  -- No jugular venous distension  Pulmonary:  -- No crackles  -- Equal breath sounds bilaterally  Cardiovascular:  -- S1, S2 present  -- No pericardial rubs Gastrointestinal:  -- Abdomen soft, nontender, nondistended, no  guarding/rebound  -- No abdominal masses appreciated, pulsatile or otherwise  Musculoskeletal / Integumentary:  -- Wounds or skin discoloration: No open/acute wounds appreciated, well-healed Left superior chest subcutaneous port -- Extremities: B/L UE and LE FROM, hands and feet warm, LLE > RLE non-pitting edema (patient reports unchanged from baseline)  -- RUE/AVF: multiple well-healed incisional scars including a pulsatile forearm AVF aneurysm, above which (towards the patient's elbow) the venous outflow is hard and cord-like without any appreciable outflow and medial longitudinal Right arm incision at site of well-healed brachial-basilic AVF transposition and another short well-healed more lateral incision underlying palpable thrill Neurologic:  -- Motor function: intact and symmetric -- Sensation: intact and symmetric  Pulse/Doppler Exam: (p=palpable; d=doppler signals; 0=none)      Right    Left  AVF Right arm easily palpable non-pulsatile thrill to level of mid-arm, where it becomes pulsatile at the end (towards the patient's shoulder) of a mid-arm surgical incision consistent with patient's recent surgical revision of his AVF for resection of a fistula aneurysm; further towards the patient's shoulder (beyond the area of increased pulsatility), there is a very weak non-pulsatile thrill  Brach   p    p   Rad   p    p   Labs:  CBC:  CBC Latest Ref Rng & Units 04/29/2016 04/15/2016 04/01/2016  WBC 4.0 - 10.5 K/uL 5.2 4.6 4.7  Hemoglobin 13.0 - 17.0 g/dL 8.3(L) 8.3(L) 8.6(L)  Hematocrit 39.0 - 52.0 % 25.8(L) 25.8(L) 26.1(L)  Platelets 150 - 400 K/uL 242 236 186    BMP:  BMP Latest Ref Rng & Units 04/29/2016 04/15/2016 04/01/2016  Glucose 65 - 99 mg/dL 122(H) 250(H) 153(H)  BUN 6 - 20 mg/dL 21(H) 22(H) 22(H)  Creatinine 0.61 - 1.24 mg/dL 4.58(H) 4.74(H) 4.91(H)  Sodium 135 - 145 mmol/L 137 137 136  Potassium 3.5 - 5.1 mmol/L 3.9 4.6 3.3(L)  Chloride 101 - 111 mmol/L 100(L) 99(L) 100(L)  CO2  22 - 32 mmol/L 30 29 30   Calcium 8.9 - 10.3 mg/dL 8.6(L) 8.6(L) 7.9(L)    Imaging studies: No available pertinent imaging studies  Assessment/Plan:  60 y.o. male with chronic ESRD attributed to diabetes mellitus and dysfunction of recently revised Right upper extremity brachial-basilic AV fistula, complicated by pertinent comorbidities including diabetes mellitus, HTN, hyperlipidemia, and colon cancer on chemotherapy via Left IJ central venous catheter with subcutaneous port.   - check BMP,  Mg, Phos  - obtain RUE hemodialysis AVF duplex ultrasound if able  - if abnormal electrolytes and nephrology wants to dialyze, will place temporary non-tunneled femoral hemodialysis catheter  - otherwise, discharge home per nephrology Berton Bon) and outpatient RUE fistulagram +/- intervention Monday, 8/7 or Tuesday, 8/8  - patient advised to be NPO after midnight Sunday night/Monday morning + do not take PO antihyperglycemic Monday 8/7, but take anti-HTN meds with sip of water  - dialysis per nephrology to follow angiogram if discharged with outpatient AVF angiogram +/- intervention  - patient advised to call office if any questions or concerns prior to or following fistulagram  All of the above findings and recommendations were discussed with the patient, his friend (present), and ED physician (Dr. Sabra Heck), and all of patient's and his friend's questions were answered to their expressed satisfaction.  Thank you for the opportunity to participate in this patient's care.   -- Marilynne Drivers Rosana Hoes, MD, [place]: [place] [facility] Office: [phone]

## 2016-05-02 ENCOUNTER — Observation Stay (HOSPITAL_COMMUNITY)
Admission: EM | Admit: 2016-05-02 | Discharge: 2016-05-03 | Disposition: A | Discharge status: Home / Self Care | Payer: Medicare Other | Attending: Family Medicine | Admitting: Family Medicine

## 2016-05-02 ENCOUNTER — Encounter (HOSPITAL_COMMUNITY): Payer: Self-pay

## 2016-05-02 DIAGNOSIS — K219 Gastro-esophageal reflux disease without esophagitis: Secondary | ICD-10-CM | POA: Diagnosis not present

## 2016-05-02 DIAGNOSIS — N186 End stage renal disease: Secondary | ICD-10-CM | POA: Insufficient documentation

## 2016-05-02 DIAGNOSIS — E1122 Type 2 diabetes mellitus with diabetic chronic kidney disease: Secondary | ICD-10-CM | POA: Insufficient documentation

## 2016-05-02 DIAGNOSIS — E7800 Pure hypercholesterolemia, unspecified: Secondary | ICD-10-CM | POA: Diagnosis not present

## 2016-05-02 DIAGNOSIS — D649 Anemia, unspecified: Secondary | ICD-10-CM | POA: Insufficient documentation

## 2016-05-02 DIAGNOSIS — C180 Malignant neoplasm of cecum: Secondary | ICD-10-CM | POA: Insufficient documentation

## 2016-05-02 DIAGNOSIS — Z992 Dependence on renal dialysis: Secondary | ICD-10-CM | POA: Diagnosis not present

## 2016-05-02 DIAGNOSIS — T82858A Stenosis of vascular prosthetic devices, implants and grafts, initial encounter: Principal | ICD-10-CM | POA: Insufficient documentation

## 2016-05-02 DIAGNOSIS — T82898A Other specified complication of vascular prosthetic devices, implants and grafts, initial encounter: Secondary | ICD-10-CM | POA: Diagnosis present

## 2016-05-02 DIAGNOSIS — Z9049 Acquired absence of other specified parts of digestive tract: Secondary | ICD-10-CM | POA: Insufficient documentation

## 2016-05-02 DIAGNOSIS — Z79899 Other long term (current) drug therapy: Secondary | ICD-10-CM | POA: Diagnosis not present

## 2016-05-02 DIAGNOSIS — Z85038 Personal history of other malignant neoplasm of large intestine: Secondary | ICD-10-CM | POA: Diagnosis not present

## 2016-05-02 DIAGNOSIS — Z8673 Personal history of transient ischemic attack (TIA), and cerebral infarction without residual deficits: Secondary | ICD-10-CM | POA: Insufficient documentation

## 2016-05-02 DIAGNOSIS — Z89422 Acquired absence of other left toe(s): Secondary | ICD-10-CM | POA: Diagnosis not present

## 2016-05-02 DIAGNOSIS — Z9841 Cataract extraction status, right eye: Secondary | ICD-10-CM | POA: Diagnosis not present

## 2016-05-02 DIAGNOSIS — I120 Hypertensive chronic kidney disease with stage 5 chronic kidney disease or end stage renal disease: Secondary | ICD-10-CM | POA: Insufficient documentation

## 2016-05-02 DIAGNOSIS — Z87891 Personal history of nicotine dependence: Secondary | ICD-10-CM | POA: Insufficient documentation

## 2016-05-02 DIAGNOSIS — T82510A Breakdown (mechanical) of surgically created arteriovenous fistula, initial encounter: Secondary | ICD-10-CM | POA: Insufficient documentation

## 2016-05-02 DIAGNOSIS — E669 Obesity, unspecified: Secondary | ICD-10-CM | POA: Diagnosis not present

## 2016-05-02 DIAGNOSIS — T82590A Other mechanical complication of surgically created arteriovenous fistula, initial encounter: Secondary | ICD-10-CM

## 2016-05-02 DIAGNOSIS — Z01811 Encounter for preprocedural respiratory examination: Secondary | ICD-10-CM

## 2016-05-02 MED ORDER — SODIUM CHLORIDE 0.9 % IV SOLN: 100.0000 mL | INTRAVENOUS | Status: DC | PRN

## 2016-05-02 MED ORDER — CALCIUM ACETATE (PHOS BINDER) 667 MG PO CAPS
667.0000 mg | ORAL_CAPSULE | Freq: Three times a day (TID) | ORAL | Status: DC
  Filled 2016-05-02: qty 1

## 2016-05-02 MED ORDER — INSULIN ASPART 100 UNIT/ML ~~LOC~~ SOLN: 0.0000 [IU] | Freq: Three times a day (TID) | SUBCUTANEOUS | Status: DC

## 2016-05-02 MED ORDER — LIDOCAINE HCL (PF) 1 % IJ SOLN: 5.0000 mL | INTRAMUSCULAR | Status: DC | PRN

## 2016-05-02 MED ORDER — ALTEPLASE 2 MG IJ SOLR
2.0000 mg | Freq: Once | INTRAMUSCULAR | Status: DC | PRN
  Filled 2016-05-02: qty 2

## 2016-05-02 MED ORDER — SEVELAMER CARBONATE 800 MG PO TABS
800.0000 mg | ORAL_TABLET | Freq: Three times a day (TID) | ORAL | Status: DC
  Filled 2016-05-02: qty 1

## 2016-05-02 MED ORDER — HEPARIN SODIUM (PORCINE) 1000 UNIT/ML DIALYSIS
1000.0000 [IU] | INTRAMUSCULAR | Status: DC | PRN
  Filled 2016-05-02: qty 1

## 2016-05-02 MED ORDER — HYDROCODONE-ACETAMINOPHEN 5-325 MG PO TABS: 1.0000 | ORAL_TABLET | Freq: Four times a day (QID) | ORAL | Status: DC | PRN

## 2016-05-02 MED ORDER — HEPARIN SODIUM (PORCINE) 1000 UNIT/ML DIALYSIS
20.0000 [IU]/kg | INTRAMUSCULAR | Status: DC | PRN
  Filled 2016-05-02: qty 3

## 2016-05-02 NOTE — H&P (Signed)
History and Physical  Nathaniel Johnston DOB: 09-17-1956 DOA: 05/02/2016  PCP: No PCP Per Patient  Patient coming from: home  Chief Complaint: problem with dialysis graft.  HPI:  60 year old man with end-stage renal disease who developed a nonfunctioning fistula 8/5. He was seen in the emergency department by surgery as well as nephrology and recommendation at that time was to return 8/6 for catheter placement and dialysis. Patient has now returned for hemodialysis. No acute issues. Hospitalist service has been asked to observe the patient.  Patient feels tired but has no other complaints.   In the emergency department: afebrile, VSS, no hypoxia No Labs obtained No data today  Review of Systems:  Negative for fever, visual changes, sore throat, rash, new muscle aches, chest pain, SOB, dysuria, bleeding  Past Medical History:  Diagnosis Date  . 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 Cancer ([place]) 07/2014   colon cancer surgery.  Finished Chemo 02/2015  . Chronic kidney disease   . Diabetes mellitus without complication ([place])   . DNR (do not resuscitate) 03/11/2016  . ESRD (end stage renal disease) ([place]) 03/11/2016  . GERD (gastroesophageal reflux disease)   . High cholesterol   . Hypertension   . Stroke [facility])    2003    Past Surgical History:  Procedure Laterality Date  . AMPUTATION TOE Left 2001  . CATARACT EXTRACTION W/PHACO Right 06/16/2015   Procedure: CATARACT EXTRACTION PHACO AND INTRAOCULAR LENS PLACEMENT RIGHT EYE CDE=54.69;  Surgeon: Williams Che, MD;  Location: [facility];  Service: Ophthalmology;  Laterality: Right;  . COLECTOMY  07/2014  . DIALYSIS FISTULA CREATION Right   . toe amputation Right 2000  . YAG LASER APPLICATION Right A999333   Procedure: YAG LASER APPLICATION;  Surgeon: Williams Che, MD;  Location: [facility];  Service: Ophthalmology;  Laterality: Right;     reports that he quit smoking about 25 years ago. His smoking use included  Cigarettes. He has a 7.50 pack-year smoking history. He has never used smokeless tobacco. He reports that he drinks about 3.0 oz of alcohol per week . He reports that he does not use drugs.   No Known Allergies  No family history on file.   Prior to Admission medications   Medication Sig Start Date End Date Taking? Authorizing Provider  amLODipine (NORVASC) 10 MG tablet Take 2 tablets by mouth daily.  07/29/14  Yes Historical Provider, MD  calcium acetate (PHOSLO) 667 MG capsule Take 1 capsule by mouth 3 (three) times daily with meals.  07/15/14  Yes Historical Provider, MD  glipiZIDE (GLUCOTROL) 10 MG tablet Take 1 tablet by mouth. 07/01/14  Yes Historical Provider, MD  Iron Polysacch Cmplx-B12-FA 150-0.025-1 MG CAPS Take 1 capsule by mouth. 08/27/14  Yes Historical Provider, MD  labetalol (NORMODYNE) 300 MG tablet Take 2 tablets by mouth. 06/02/10  Yes Historical Provider, MD  lisinopril (PRINIVIL,ZESTRIL) 40 MG tablet Take 1 tablet by mouth. 06/02/10  Yes Historical Provider, MD  potassium chloride SA (K-DUR,KLOR-CON) 20 MEQ tablet Take 1 tablet (20 mEq total) by mouth daily. 04/06/16  Yes Baird Cancer, PA-C  sevelamer carbonate (RENVELA) 800 MG tablet Take 800 mg by mouth 3 (three) times daily with meals.  07/30/14  Yes Historical Provider, MD  simvastatin (ZOCOR) 80 MG tablet Take 1 tablet by mouth. 08/17/14  Yes Historical Provider, MD  Cetuximab (ERBITUX IV) Inject into the vein. Every 2 weeks    Historical Provider, MD  dexamethasone (DECADRON) 4 MG tablet  Take 2 tablets (8 mg total) by mouth daily. Start the day after chemo for 2 days. 04/27/16   Patrici Ranks, MD  fluorouracil CALGB 29562 in sodium chloride 0.9 % 150 mL Inject into the vein over 24 hr.    Historical Provider, MD  HYDROcodone-acetaminophen (NORCO/VICODIN) 5-325 MG tablet Take 1 tablet by mouth every 6 (six) hours as needed for moderate pain.  02/21/16   Historical Provider, MD  irinotecan in dextrose 5 % 500 mL Inject  into the vein once. Every 2 weeks    Historical Provider, MD  LEUCOVORIN CALCIUM IJ Inject as directed. Every 2 weeks    Historical Provider, MD  loperamide (IMODIUM A-D) 2 MG tablet Take 2 at diarrhea onset , then 1 every 2hr until 12hrs with no BM. May take 2 every 4hrs at night. If diarrhea recurs repeat. 04/27/16   Patrici Ranks, MD  loperamide (IMODIUM) 2 MG capsule Take 2 capsules after the first loose stool and then 1 capsule every 2 hours until you go a total of 12 hours without having a loose stool. If it is bedtime and you are having loose stools take 2 capsules every 4 hours until morning. Call [facility]. 04/01/16   Patrici Ranks, MD  ondansetron (ZOFRAN) 8 MG tablet Take 1 tablet (8 mg total) by mouth 2 (two) times daily as needed for refractory nausea / vomiting. Start on day 3 after chemotherapy. 04/27/16   Patrici Ranks, MD  prochlorperazine (COMPAZINE) 10 MG tablet Take 1 tablet (10 mg total) by mouth every 6 (six) hours as needed (NAUSEA). 04/27/16   Patrici Ranks, MD  Skin Protectants, Misc. (EUCERIN) cream Apply topically. 08/29/14   Historical Provider, MD  triamcinolone cream (KENALOG) 0.1 % Apply topically. 06/05/14   Historical Provider, MD    Physical Exam: Vitals:   05/02/16 1545 05/02/16 1615 05/02/16 1645 05/02/16 1715  BP: 134/68 128/63 106/56 98/67  Pulse: 70 68 68 67  Resp:      Temp:      TempSrc:      SpO2:      Weight:      Height:        Constitutional:  . Appears calm and comfortable Respiratory:  . CTA bilaterally, no w/r/r.  . Respiratory effort normal. No retractions or accessory muscle use Cardiovascular:  . RRR, no m/r/g Abdomen:  . no tenderness or masses Psychiatric:  . Mental status o Mood, affect appropriate  Wt Readings from Last 3 Encounters:  05/02/16 114.3 kg (251 lb 15.8 oz)  05/01/16 114.3 kg (252 lb)  04/29/16 116.6 kg (257 lb)    I have personally reviewed following labs and imaging studies  Labs on Admission:    CBC:  Recent Labs Lab 04/29/16 1124  WBC 5.2  NEUTROABS 3.4  HGB 8.3*  HCT 25.8*  MCV 103.2*  PLT [id number]   Basic Metabolic Panel:  Recent Labs Lab 04/29/16 1124 05/01/16 1432  NA 137 136  K 3.9 5.2*  CL 100* 96*  CO2 30 27  GLUCOSE 122* 187*  BUN 21* 59*  CREATININE 4.58* 7.38*  CALCIUM 8.6* 9.3  MG 2.6* 3.5*  PHOS  --  4.7*   Liver Function Tests:  Recent Labs Lab 04/29/16 1124  AST 19  ALT 14*  ALKPHOS 55  BILITOT 0.2*  PROT 7.1  ALBUMIN 3.6    Radiological Exams on Admission: [place] Upper Ext Art Right Ltd  Result Date: 05/02/2016 CLINICAL DATA:  60 year old  male with bilateral shoulder pain and inability to access right upper arm hemodialysis access EXAM: RIGHT UPPER EXTREMITY ARTERIAL DUPLEX SCAN TECHNIQUE: Gray-scale sonography as well as color Doppler and duplex ultrasound was performed to evaluate the arteries of the upper extremity. COMPARISON:  None. FINDINGS: Sonographic interrogation of the brachial artery to basilic vein arteriovenous graft. The arterial anastomosis is widely patent. There is elevation of the peak systolic velocity to [id number] centimeters/second compared to 123 centimeters/second in the brachial artery proximally. The graft demonstrates partial peripheral calcification resulting in shadowing on some images. There is evidence of a narrowing at the venous anastomosis with a significantly elevated peak systolic velocity at XX123456 centimeters/second, diffuse spectral broadening and aliasing. This is consistent with a hemodynamically significant stenosis. At a point labeled as the access site there is peripheral heterogeneous echogenicity surrounding the graft material likely representing old hematoma. Additionally, there is some a documented elevation of the peak systolic velocity at the apex of the loop graft. This may be artifactual and related to the curvature of the graft. IMPRESSION: 1. Patent right brachiobasilic arteriovenous loop graft. 2. 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 3. Also suspect a more moderate stenosis at the arterial anastomosis. 4. Old hematoma surrounds the graft in the region of the 1 of the cannulation sites. Signed, Criselda Peaches, MD [facility] Radiology Electronically Signed   By: Jacqulynn Cadet M.D.   On: 05/02/2016 08:42      Active Problems:   ESRD (end stage renal disease) ([place])    Assessment/Plan 1. ESRD on HD 2. DM, stable 3. PMH CVA 4. Cecal adenocarcinoma   Obs for HD  Management per nephrology and surgery.  Comorbidities stable.   DVT prophylaxis:SCDs Code Status: full Family Communication: none prsent Disposition Plan: obs     Time spent: 25 minutes  Murray Hodgkins, MD  [facility] Direct contact: [phone] --[place]  --[email or web address]; password [email or web address]  7PM-7AM contact night coverage as above  05/02/2016, 5:46 PM

## 2016-05-02 NOTE — ED Notes (Signed)
Dr Davis at bedside.

## 2016-05-02 NOTE — Procedures (Addendum)
   HEMODIALYSIS TREATMENT NOTE:  4 hour heparin-free dialysis completed via second right femoral non-tunneled catheter.  First catheter was replaced over guide wire as it had kinked and would not tolerate a blood flow rate of 100cc/min, resulting in a clotted extracorporeal circuit and estimated blood loss of 300cc. The second catheter tolerated average BFR of 300cc/min. Goal met: 3 liters removed without interruption in ultrafiltration.  All blood from second ECC was returned. Pt hemodynamically stable throughout HD session.  Pt seen on HD by Dr. Sarajane Jews; admission orders written. Dr. Rosana Hoes to remove femoral catheter this evening.  Report called to Linus Orn, RN prior to patient transfer to room 312.   Pt was instructed to remain in bed until femoral catheter is removed.   Rockwell Alexandria, RN, CDN

## 2016-05-02 NOTE — ED Provider Notes (Signed)
[facility] DEPT Provider Note   CSN: [id number] Arrival date & time: 05/02/16  1106  First Provider Contact:  First MD Initiated Contact with Patient 05/02/16 1116        History   Chief Complaint Chief Complaint  Patient presents with  . Vascular Access Problem    Needs dialysis cath    HPI KEVONTAE SILVERIA is a 60 y.o. male.  Patient was seen yesterday because his dialysis graft was clotted. He was seen by nephrology yesterday had a minimally elevated potassium. The plan was to have him come back to the hospital today to get a femoral graft placed by the surgeon so he can have dialysis today   The history is provided by the patient. No language interpreter was used.  Weakness  This is a recurrent problem. The current episode started more than 2 days ago. The problem occurs constantly. The problem has not changed since onset.Pertinent negatives include no chest pain, no abdominal pain and no headaches. Nothing aggravates the symptoms. Nothing relieves the symptoms.    Past Medical History:  Diagnosis Date  . 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 Cancer ([place]) 07/2014   colon cancer surgery.  Finished Chemo 02/2015  . Chronic kidney disease   . Diabetes mellitus without complication ([place])   . DNR (do not resuscitate) 03/11/2016  . ESRD (end stage renal disease) ([place]) 03/11/2016  . GERD (gastroesophageal reflux disease)   . High cholesterol   . Hypertension   . Stroke [facility])    2003    Patient Active Problem List   Diagnosis Date Noted  . Diarrhea 04/01/2016  . DNR (do not resuscitate) 03/11/2016  . ESRD (end stage renal disease) ([place]) 03/11/2016  . 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 High cholesterol     Past Surgical History:  Procedure Laterality Date  . AMPUTATION TOE Left 2001  . CATARACT EXTRACTION W/PHACO Right 06/16/2015   Procedure: CATARACT EXTRACTION PHACO AND INTRAOCULAR LENS PLACEMENT RIGHT EYE CDE=54.69;  Surgeon: Williams Che, MD;   Location: [facility]S;  Service: Ophthalmology;  Laterality: Right;  . COLECTOMY  07/2014  . DIALYSIS FISTULA CREATION Right   . toe amputation Right 2000  . YAG LASER APPLICATION Right [id number]   Procedure: YAG LASER APPLICATION;  Surgeon: Williams Che, MD;  Location: [facility]S;  Service: Ophthalmology;  Laterality: Right;       Home Medications    Prior to Admission medications   Medication Sig Start Date End Date Taking? Authorizing Provider  amLODipine (NORVASC) 10 MG tablet Take 2 tablets by mouth daily.  07/29/14  Yes Historical Provider, MD  calcium acetate (PHOSLO) 667 MG capsule Take 1 capsule by mouth 3 (three) times daily with meals.  07/15/14  Yes Historical Provider, MD  glipiZIDE (GLUCOTROL) 10 MG tablet Take 1 tablet by mouth. 07/01/14  Yes Historical Provider, MD  Iron Polysacch Cmplx-B12-FA 150-0.025-1 MG CAPS Take 1 capsule by mouth. 08/27/14  Yes Historical Provider, MD  labetalol (NORMODYNE) 300 MG tablet Take 2 tablets by mouth. 06/02/10  Yes Historical Provider, MD  lisinopril (PRINIVIL,ZESTRIL) 40 MG tablet Take 1 tablet by mouth. 06/02/10  Yes Historical Provider, MD  potassium chloride SA (K-DUR,KLOR-CON) 20 MEQ tablet Take 1 tablet (20 mEq total) by mouth daily. 04/06/16  Yes Baird Cancer, PA-C  sevelamer carbonate (RENVELA) 800 MG tablet Take 800 mg by mouth 3 (three) times daily with meals.  07/30/14  Yes Historical Provider, MD  simvastatin (ZOCOR) 80 MG  tablet Take 1 tablet by mouth. 08/17/14  Yes Historical Provider, MD  Cetuximab (ERBITUX IV) Inject into the vein. Every 2 weeks    Historical Provider, MD  dexamethasone (DECADRON) 4 MG tablet Take 2 tablets (8 mg total) by mouth daily. Start the day after chemo for 2 days. 04/27/16   Allene Pyo, MD  fluorouracil CALGB 14481 in sodium chloride 0.9 % 150 mL Inject into the vein over 24 hr.    Historical Provider, MD  HYDROcodone-acetaminophen (NORCO/VICODIN) 5-325 MG tablet Take 1 tablet by mouth every 6 (six)  hours as needed for moderate pain.  02/21/16   Historical Provider, MD  irinotecan in dextrose 5 % 500 mL Inject into the vein once. Every 2 weeks    Historical Provider, MD  LEUCOVORIN CALCIUM IJ Inject as directed. Every 2 weeks    Historical Provider, MD  loperamide (IMODIUM A-D) 2 MG tablet Take 2 at diarrhea onset , then 1 every 2hr until 12hrs with no BM. May take 2 every 4hrs at night. If diarrhea recurs repeat. 04/27/16   Allene Pyo, MD  loperamide (IMODIUM) 2 MG capsule Take 2 capsules after the first loose stool and then 1 capsule every 2 hours until you go a total of 12 hours without having a loose stool. If it is bedtime and you are having loose stools take 2 capsules every 4 hours until morning. Call [facility]. 04/01/16   Allene Pyo, MD  ondansetron (ZOFRAN) 8 MG tablet Take 1 tablet (8 mg total) by mouth 2 (two) times daily as needed for refractory nausea / vomiting. Start on day 3 after chemotherapy. 04/27/16   Allene Pyo, MD  prochlorperazine (COMPAZINE) 10 MG tablet Take 1 tablet (10 mg total) by mouth every 6 (six) hours as needed (NAUSEA). 04/27/16   Allene Pyo, MD  Skin Protectants, Misc. (EUCERIN) cream Apply topically. 08/29/14   Historical Provider, MD  triamcinolone cream (KENALOG) 0.1 % Apply topically. 06/05/14   Historical Provider, MD    Family History No family history on file.  Social History Social History  Substance Use Topics  . Smoking status: Former Smoker    Packs/day: 0.50    Years: 15.00    Types: Cigarettes    Quit date: 03/12/1991  . Smokeless tobacco: Never Used  . Alcohol use 3.0 oz/week    5 Shots of liquor per week     Allergies   Review of patient's allergies indicates no known allergies.   Review of Systems Review of Systems  Constitutional: Negative for appetite change and fatigue.  HENT: Negative for congestion, ear discharge and sinus pressure.   Eyes: Negative for discharge.  Respiratory: Negative for cough.     Cardiovascular: Negative for chest pain.  Gastrointestinal: Negative for abdominal pain and diarrhea.  Genitourinary: Negative for frequency and hematuria.  Musculoskeletal: Negative for back pain.  Skin: Negative for rash.  Neurological: Positive for weakness. Negative for seizures and headaches.  Psychiatric/Behavioral: Negative for hallucinations.     Physical Exam Updated Vital Signs BP 157/77 (BP Location: Left Arm)   Pulse 82   Temp 97.5 F (36.4 C) (Oral)   Resp 16   Ht 6\' 1"  (1.854 m)   Wt 252 lb (114.3 kg)   SpO2 100%   BMI 33.25 kg/m   Physical Exam  Constitutional: He is oriented to person, place, and time. He appears well-developed.  HENT:  Head: Normocephalic.  Eyes: Conjunctivae are normal.  Neck: No tracheal deviation  present.  Cardiovascular:  No murmur heard. Musculoskeletal: Normal range of motion.  Neurological: He is oriented to person, place, and time.  Skin: Skin is warm.  Psychiatric: He has a normal mood and affect.     ED Treatments / Results  Labs (all labs ordered are listed, but only abnormal results are displayed) Labs Reviewed - No data to display  EKG  EKG Interpretation None       Radiology [place] Upper Ext Art Right Ltd  Result Date: 05/02/2016 CLINICAL DATA:  60 year old male with bilateral shoulder pain and inability to access right upper arm hemodialysis access EXAM: RIGHT UPPER EXTREMITY ARTERIAL DUPLEX SCAN TECHNIQUE: Gray-scale sonography as well as color Doppler and duplex ultrasound was performed to evaluate the arteries of the upper extremity. COMPARISON:  None. FINDINGS: Sonographic interrogation of the brachial artery to basilic vein arteriovenous graft. The arterial anastomosis is widely patent. There is elevation of the peak systolic velocity to A999333 centimeters/second compared to 123 centimeters/second in the brachial artery proximally. The graft demonstrates partial peripheral calcification resulting in shadowing on some  images. There is evidence of a narrowing at the venous anastomosis with a significantly elevated peak systolic velocity at XX123456 centimeters/second, diffuse spectral broadening and aliasing. This is consistent with a hemodynamically significant stenosis. At a point labeled as the access site there is peripheral heterogeneous echogenicity surrounding the graft material likely representing old hematoma. Additionally, there is some a documented elevation of the peak systolic velocity at the apex of the loop graft. This may be artifactual and related to the curvature of the graft. IMPRESSION: 1. Patent right brachiobasilic arteriovenous loop graft. 2. 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 3. Also suspect a more moderate stenosis at the arterial anastomosis. 4. Old hematoma surrounds the graft in the region of the 1 of the cannulation sites. Signed, Criselda Peaches, MD [facility] Electronically Signed   By: Jacqulynn Cadet M.D.   On: 05/02/2016 08:42    Procedures Procedures (including critical care time)  Medications Ordered in ED Medications - No data to display   Initial Impression / Assessment and Plan / ED Course  I have reviewed the triage vital signs and the nursing notes.  Pertinent labs & imaging results that were available during my care of the patient were reviewed by me and considered in my medical decision making (see chart for details).  Clinical Course    Patient will be admitted to observation for dialysis. Patient has been seen by surgery to put a dialysis catheter in. Patient has also been seen by nephrology and orders have been written to give him dialysis  Final Clinical Impressions(s) / ED Diagnoses   Final diagnoses:  None    New Prescriptions New Prescriptions   No medications on file     Milton Ferguson, MD 05/02/16 1332

## 2016-05-02 NOTE — Consult Note (Signed)
Reason for Consult: End-stage renal disease Referring Physician: [facility] hospitalist Johnston  Nathaniel Johnston is an 60 y.o. male.  HPI: He is a patient was history of hypertension, diabetes, CVA, end-stage renal disease on maintenance hemodialysis presently was referred to Dr. Rosana Johnston for clotted AV graft. Patient was dialyzed  on Thursday and came to the dialysis unit yesterday. When he was evaluated his access was clotted and unable to dialyze patient. Patient was sent here and he was evaluated. Since he was stable and potassium was high normal patient was sent home to come this morning. Presently patient still remains asymptomatic. He didn't have any nausea or vomiting.  Past Medical History:  Diagnosis Date  . 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 Cancer ([place]) 07/2014   colon cancer surgery.  Finished Chemo 02/2015  . Chronic kidney disease   . Diabetes mellitus without complication ([place])   . DNR (do not resuscitate) 03/11/2016  . ESRD (end stage renal disease) ([name]) 03/11/2016  . GERD (gastroesophageal reflux disease)   . High cholesterol   . Hypertension   . Stroke [facility])    2003    Past Surgical History:  Procedure Laterality Date  . AMPUTATION TOE Left 2001  . CATARACT EXTRACTION W/PHACO Right 06/16/2015   Procedure: CATARACT EXTRACTION PHACO AND INTRAOCULAR LENS PLACEMENT RIGHT EYE CDE=54.69;  Surgeon: Nathaniel Che, MD;  Location: [facility];  Service: Ophthalmology;  Laterality: Right;  . COLECTOMY  07/2014  . DIALYSIS FISTULA CREATION Right   . toe amputation Right 2000  . YAG LASER APPLICATION Right 47/05/6282   Procedure: YAG LASER APPLICATION;  Surgeon: Nathaniel Che, MD;  Location: [facility] ORS;  Service: Ophthalmology;  Laterality: Right;    No family history on file.  Social History:  reports that he quit smoking about 25 years ago. His smoking use included Cigarettes. He has a 7.50 pack-year smoking history. He has never used smokeless tobacco. He reports that he drinks  about 3.0 oz of alcohol per week . He reports that he does not use drugs.  Allergies: No Known Allergies  Medications: I have reviewed the patient's current medications.  Results for orders placed or performed during the hospital encounter of 05/01/16 (from the past 48 hour(s))  Basic metabolic panel     Status: Abnormal   Collection Time: 05/01/16  2:32 PM  Result Value Ref Range   Sodium 136 135 - 145 mmol/L   Potassium 5.2 (H) 3.5 - 5.1 mmol/L   Chloride 96 (L) 101 - 111 mmol/L   CO2 27 22 - 32 mmol/L   Glucose, Bld 187 (H) 65 - 99 mg/dL   BUN 59 (H) 6 - 20 mg/dL   Creatinine, Ser 7.38 (H) 0.61 - 1.24 mg/dL   Calcium 9.3 8.9 - 10.3 mg/dL   GFR calc non Af Amer 7 (L) >60 mL/min   GFR calc Af Amer 8 (L) >60 mL/min    Comment: (NOTE) The eGFR has been calculated using the CKD EPI equation. This calculation has not been validated in all clinical situations. eGFR's persistently <60 mL/min signify possible Chronic Kidney Disease.    Anion gap 13 5 - 15  Magnesium     Status: Abnormal   Collection Time: 05/01/16  2:32 PM  Result Value Ref Range   Magnesium 3.5 (H) 1.7 - 2.4 mg/dL  Phosphorus     Status: Abnormal   Collection Time: 05/01/16  2:32 PM  Result Value Ref Range   Phosphorus 4.7 (H) 2.5 -  4.6 mg/dL    [place] Upper Ext Art Right Ltd  Result Date: 05/02/2016 CLINICAL DATA:  60 year old male with bilateral shoulder pain and inability to access right upper arm hemodialysis access EXAM: RIGHT UPPER EXTREMITY ARTERIAL DUPLEX SCAN TECHNIQUE: Gray-scale sonography as well as color Doppler and duplex ultrasound was performed to evaluate the arteries of the upper extremity. COMPARISON:  None. FINDINGS: Sonographic interrogation of the brachial artery to basilic vein arteriovenous graft. The arterial anastomosis is widely patent. There is elevation of the peak systolic velocity to 886 centimeters/second compared to 123 centimeters/second in the brachial artery proximally. The graft  demonstrates partial peripheral calcification resulting in shadowing on some images. There is evidence of a narrowing at the venous anastomosis with a significantly elevated peak systolic velocity at 773 centimeters/second, diffuse spectral broadening and aliasing. This is consistent with a hemodynamically significant stenosis. At a point labeled as the access site there is peripheral heterogeneous echogenicity surrounding the graft material likely representing old hematoma. Additionally, there is some a documented elevation of the peak systolic velocity at the apex of the loop graft. This may be artifactual and related to the curvature of the graft. IMPRESSION: 1. Patent right brachiobasilic arteriovenous loop graft. 2. 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 3. Also suspect a more moderate stenosis at the arterial anastomosis. 4. Old hematoma surrounds the graft in the region of the 1 of the cannulation sites. Signed, Nathaniel Peaches, MD [facility] Radiology Electronically Signed   By: Nathaniel Johnston M.D.   On: 05/02/2016 08:42    Review of Systems  Constitutional: Negative for chills.  Respiratory: Negative for shortness of breath.   Cardiovascular: Positive for leg swelling. Negative for chest pain, orthopnea and PND.  Gastrointestinal: Negative for constipation, nausea and vomiting.  Neurological: Positive for weakness.   Blood pressure 157/77, pulse 82, temperature 97.5 F (36.4 C), temperature source Oral, resp. rate 16, height 6' 1" (1.854 m), weight 114.3 kg (252 lb), SpO2 100 %. Physical Exam  Constitutional: No distress.  Eyes: Scleral icterus is present.  Neck: No JVD present.  Cardiovascular: Normal rate and regular rhythm.   No murmur heard. Respiratory: He has no rales.  Decreased breath sounds bilaterally  GI: He exhibits no distension. There is no tenderness.  Musculoskeletal: He exhibits edema.  Neurological: He  is alert.    Assessment/Plan: Problem #1 clotted AV graft. Patient is going to have possible angioplasty of the stenosis in a.m. Since patient didn't get dialysis for the last 2 days we'll make arrangement for patient to get dialysis today after femoral catheter is place. Once patient has a permanent access tomorrow possibly will discharge him to be followed as an outpatient tomorrow. Problem #2 hypertension: His blood pressure is reasonably controlled Problem #3 metabolic bone disease: His calcium and phosphorus is range Problem #4 history of colonic cancer with metastasis. Presently is on chemotherapy. Problem #5 history of diabetes Problem #6 fluid management: Patient with chronic venous stasis and some edema. He denies any difficulty breathing Problem #7 history of CVA Problem #8 obesity Problem #9 anemia: His hemoglobin is below our target goal. Plan: We'll dialyze patient for 4 hours today and try to remove about 3 L if his blood pressure tolerates 2] we'll check his renal panel in the morning. 3] we'll use Epogen 10,000 units after each dialysis.  Tequia Wolman S 05/02/2016, 12:22 PM

## 2016-05-02 NOTE — Consult Note (Signed)
SURGICAL CONSULTATION NOTE (initial)  HISTORY OF PRESENT ILLNESS (HPI):  60 y.o. Right-handed male with ESRD attributed to DM since 2010 on Tu/Th/Sa hemodialysis via RUE transposed brachial-basilic arteriovenous fistula (created 1 year ago at [facility]) presented today after hemodialysis RN at [facility] in [place], [place] was unable to access his AVF for hemodialysis yesterday. Patient's labs included K - 5.2, Mg - 3.5, and Phos - 4.7, for which patient was advised by nephrology to return today for insertion of a non-tunneled (temporary) hemodialysis catheter and hemodialysis. Patient has returned accordingly with no new complaints, denies CP or SOB.  As described previously, patient reports the fistula was surgically revised in [place] 3 weeks ago for resection of a fistula aneurysm, though there is no documentation of such a procedure in [facility]'s EMR. He also reports that he had a fistula angiogram prior to that surgery, but does not recall if any intervention was done at that time, and he thinks he's underwent prior fistula angiograms with intervention. This is patient's second AVF after the outflow of a previous Right upper extremity radial-cephalic AVF (created at [facility] in 2010) became thrombosed. Patient was told at that time the veins in his Left upper extremity are too small to be used for hemodialysis access. Patient denies any pain or numbness/tingling in his Right hand/fingers.  PAST MEDICAL HISTORY (PMH):  Past Medical History:  Diagnosis Date  . 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 Cancer ([place]) 07/2014   colon cancer surgery.  Finished Chemo 02/2015  . Chronic kidney disease   . Diabetes mellitus without complication ([place])   . DNR (do not resuscitate) 03/11/2016  . ESRD (end stage renal disease) ([place]) 03/11/2016  . GERD (gastroesophageal reflux disease)   . High cholesterol   . Hypertension   . Stroke [facility])    2003     PAST SURGICAL HISTORY  [facility]):  Past Surgical History:  Procedure Laterality Date  . AMPUTATION TOE Left 2001  . CATARACT EXTRACTION W/PHACO Right 06/16/2015   Procedure: CATARACT EXTRACTION PHACO AND INTRAOCULAR LENS PLACEMENT RIGHT EYE CDE=54.69;  Surgeon: Williams Che, MD;  Location: [facility] ORS;  Service: Ophthalmology;  Laterality: Right;  . COLECTOMY  07/2014  . DIALYSIS FISTULA CREATION Right   . toe amputation Right 2000  . YAG LASER APPLICATION Right [id number]   Procedure: YAG LASER APPLICATION;  Surgeon: Williams Che, MD;  Location: [facility] ORS;  Service: Ophthalmology;  Laterality: Right;     MEDICATIONS:  Prior to Admission medications   Medication Sig Start Date End Date Taking? Authorizing Provider  amLODipine (NORVASC) 10 MG tablet Take 2 tablets by mouth daily.  07/29/14  Yes Historical Provider, MD  calcium acetate (PHOSLO) 667 MG capsule Take 1 capsule by mouth 3 (three) times daily with meals.  07/15/14  Yes Historical Provider, MD  glipiZIDE (GLUCOTROL) 10 MG tablet Take 1 tablet by mouth. 07/01/14  Yes Historical Provider, MD  Iron Polysacch Cmplx-B12-FA 150-0.025-1 MG CAPS Take 1 capsule by mouth. 08/27/14  Yes Historical Provider, MD  labetalol (NORMODYNE) 300 MG tablet Take 2 tablets by mouth. 06/02/10  Yes Historical Provider, MD  lisinopril (PRINIVIL,ZESTRIL) 40 MG tablet Take 1 tablet by mouth. 06/02/10  Yes Historical Provider, MD  potassium chloride SA (K-DUR,KLOR-CON) 20 MEQ tablet Take 1 tablet (20 mEq total) by mouth daily. 04/06/16  Yes Baird Cancer, PA-C  sevelamer carbonate (RENVELA) 800 MG tablet Take 800 mg by mouth 3 (three) times daily with meals.  07/30/14  Yes Historical Provider, MD  simvastatin (ZOCOR) 80 MG tablet Take 1 tablet by mouth. 08/17/14  Yes Historical Provider, MD  Cetuximab (ERBITUX IV) Inject into the vein. Every 2 weeks    Historical Provider, MD  dexamethasone (DECADRON) 4 MG tablet Take 2 tablets (8 mg total) by mouth daily. Start the day after chemo for 2 days.  04/27/16   Patrici Ranks, MD  fluorouracil CALGB 16109 in sodium chloride 0.9 % 150 mL Inject into the vein over 24 hr.    Historical Provider, MD  HYDROcodone-acetaminophen (NORCO/VICODIN) 5-325 MG tablet Take 1 tablet by mouth every 6 (six) hours as needed for moderate pain.  02/21/16   Historical Provider, MD  irinotecan in dextrose 5 % 500 mL Inject into the vein once. Every 2 weeks    Historical Provider, MD  LEUCOVORIN CALCIUM IJ Inject as directed. Every 2 weeks    Historical Provider, MD  loperamide (IMODIUM A-D) 2 MG tablet Take 2 at diarrhea onset , then 1 every 2hr until 12hrs with no BM. May take 2 every 4hrs at night. If diarrhea recurs repeat. 04/27/16   Patrici Ranks, MD  loperamide (IMODIUM) 2 MG capsule Take 2 capsules after the first loose stool and then 1 capsule every 2 hours until you go a total of 12 hours without having a loose stool. If it is bedtime and you are having loose stools take 2 capsules every 4 hours until morning. Call [place]. 04/01/16   Patrici Ranks, MD  ondansetron (ZOFRAN) 8 MG tablet Take 1 tablet (8 mg total) by mouth 2 (two) times daily as needed for refractory nausea / vomiting. Start on day 3 after chemotherapy. 04/27/16   Patrici Ranks, MD  prochlorperazine (COMPAZINE) 10 MG tablet Take 1 tablet (10 mg total) by mouth every 6 (six) hours as needed (NAUSEA). 04/27/16   Patrici Ranks, MD  Skin Protectants, Misc. (EUCERIN) cream Apply topically. 08/29/14   Historical Provider, MD  triamcinolone cream (KENALOG) 0.1 % Apply topically. 06/05/14   Historical Provider, MD     ALLERGIES:  No Known Allergies   SOCIAL HISTORY:  Social History   Social History  . Marital status: Married    Spouse name: N/A  . Number of children: N/A  . Years of education: N/A   Occupational History  . [employer]     [employer]   Social History Main Topics  . Smoking status: Former Smoker    Packs/day: 0.50    Years: 15.00    Types:  Cigarettes    Quit date: 03/12/1991  . Smokeless tobacco: Never Used  . Alcohol use 3.0 oz/week    5 Shots of liquor per week  . Drug use: No  . Sexual activity: Not on file   Other Topics Concern  . Not on file   Social History Narrative  . No narrative on file    The patient currently resides (home / rehab facility / nursing home): Home  The patient normally is (ambulatory / bedbound): Ambulatory   FAMILY HISTORY:  No family history on file.    REVIEW OF SYSTEMS:  Constitutional: denies weight loss, fever, chills, or sweats  Eyes: denies any other vision changes, history of eye injury  ENT: denies sore throat, hearing problems  Respiratory: denies shortness of breath, wheezing  Cardiovascular: denies chest pain, palpitations  Gastrointestinal: denies abdominal pain, N/V, or diarrhea  Musculoskeletal: denies any other joint pains or cramps  Skin:  denies any other rashes or skin discolorations  Neurological: denies any other headache, dizziness, weakness  Psychiatric: denies any other depression, anxiety   All other review of systems were negative   VITAL SIGNS:  Temp:  [97.5 F (36.4 C)] 97.5 F (36.4 C) (08/06 1116) Pulse Rate:  [73-82] 82 (08/06 1116) Resp:  [16-18] 16 (08/06 1116) BP: (137-157)/(70-89) 157/77 (08/06 1116) SpO2:  [95 %-100 %] 100 % (08/06 1116) Weight:  [252 lb (114.3 kg)] 252 lb (114.3 kg) (08/06 1116)     Height: 6\' 1"  (185.4 cm) Weight: 252 lb (114.3 kg) BMI (Calculated): 33.3   INTAKE/OUTPUT:  This shift: No intake/output data recorded.  Last 2 shifts: @IOLAST2SHIFTS @   PHYSICAL EXAM:  Constitutional:  -- Normal body habitus  -- Awake, alert, and oriented x3  Eyes:  -- Pupils equally round and reactive to light  -- No scleral icterus  Ear, nose, and throat:  -- No jugular venous distension  Pulmonary:  -- No crackles  -- Equal breath sounds bilaterally  Cardiovascular:  -- S1, S2 present  -- No pericardial rubs Gastrointestinal:   -- Abdomen soft, nontender, nondistended, no guarding/rebound  -- No abdominal masses appreciated, pulsatile or otherwise  Musculoskeletal / Integumentary:  -- Wounds or skin discoloration: No open/acute wounds appreciated, well-healed Left superior chest subcutaneous port -- Extremities: B/L UE and LE FROM, hands and feet warm, LLE > RLE non-pitting edema (patient reports unchanged from baseline)                       -- RUE/AVF: multiple well-healed incisional scars including a pulsatile forearm AVF aneurysm, above which (towards the patient's elbow) the venous outflow is hard and cord-like without any appreciable outflow and medial longitudinal Right arm incision at site of well-healed brachial-basilic AVF transposition and another short well-healed more lateral incision underlying palpable thrill Neurologic:  -- Motor function: intact and symmetric -- Sensation: intact and symmetric  Pulse/Doppler Exam: (p=palpable; d=doppler signals; 0=none)       Right   Left  AVF Right arm easily palpable non-pulsatile thrill to level of mid-arm, where it becomes pulsatile at the end (towards the patient's shoulder) of a mid-arm surgical incision consistent with patient's recent surgical revision of his AVF for resection of a fistula aneurysm; further towards the patient's shoulder (beyond the area of increased pulsatility), there is a very weak non-pulsatile thrill  Brach   p   p   Rad   p   p   Labs:  CMP     Component Value Date/Time   NA 136 05/01/2016 1432   K 5.2 (H) 05/01/2016 1432   CL 96 (L) 05/01/2016 1432   CO2 27 05/01/2016 1432   GLUCOSE 187 (H) 05/01/2016 1432   BUN 59 (H) 05/01/2016 1432   CREATININE 7.38 (H) 05/01/2016 1432   CALCIUM 9.3 05/01/2016 1432   PROT 7.1 04/29/2016 1124   ALBUMIN 3.6 04/29/2016 1124   AST 19 04/29/2016 1124   ALT 14 (L) 04/29/2016 1124   ALKPHOS 55 04/29/2016 1124   BILITOT 0.2 (L) 04/29/2016 1124   GFRNONAA 7 (L) 05/01/2016 1432   GFRAA 8 (L)  05/01/2016 1432    Assessment/Plan:  60 y.o. male with chronic ESRD attributed to diabetes mellitus and dysfunction of recently revised Right upper extremity brachial-basilic AV fistula, requiring hemodialysis access, complicated by pertinent comorbidities including diabetes mellitus, HTN, hyperlipidemia, and colon cancer on chemotherapy via Left IJ central venous catheter with subcutaneous port.                        -  hemodialysis today as per nephrology                       - all risks, benefits, and alternatives to insertion of non-tunneled hemodialysis catheter discussed with patient, all of his questions were answered to his expressed satisfaction, and informed consent was obtained and documented                       - patient advised to be NPO after midnight Sunday night/Monday morning + do not take PO antihyperglycemic Monday 8/7, but take anti-HTN meds with sip of water                       - will plan for AVF angiogram +/- intervention tomorrow pending OR availability  All of the above findings and recommendations were discussed with the patient, his friend (present), and ED physician (Dr. Sabra Heck), and all of patient's and his friend's questions were answered to their expressed satisfaction.  Thank you for the opportunity to participate in this patient's care.   -- Marilynne Drivers Rosana Hoes, MD, [place]: [place] and Vascular Surgery Office: [phone]

## 2016-05-02 NOTE — ED Triage Notes (Signed)
Pt instructed to come to ER to meet Dr. Rosana Hoes for new dialysis access.

## 2016-05-02 NOTE — ED Notes (Signed)
Patient taken to room 208 for dialysis. Pt handed off to [place], Dialysis RN.

## 2016-05-02 NOTE — Procedures (Signed)
SURGICAL PROCEDURE REPORT  DATE OF PROCEDURE: 05/02/2016  ATTENDING: Corene Cornea E. Rosana Hoes, MD  ANESTHESIA: Local  PRE-PROCEDURE DIAGNOSIS: End-stage renal disease with RUE AVF dysfunction, requiring hemodialysis access  POST-PROCEDURE DIAGNOSIS: End-stage renal disease with RUE AVF dysfunction, requiring hemodialysis access  PROCEDURE(S): Insertion of Right femoral vein non-tunneled temporary hemodialysis catheter using ultrasound guidance   INTRAOPERATIVE FINDINGS: Non-pulsatile venous blood return with clear visualization of venous access vessel on ultrasound   ESTIMATED BLOOD LOSS: Minimal (<20 mL)   SPECIMENS: None   IMPLANTS: Non-tunneled temporary hemodialysis catheter   DRAINS: None   COMPLICATIONS: None apparent   CONDITION AT COMPLETION: Unchanged from pre-procedure   DISPOSITION: Remaining in Emergency Department awaiting hemodialysis   INDICATIONS FOR PROCEDURE: Patient is a 60 y.o. male with ESRD and RUE AVF dysfunction, requiring hemodialysis. All risks, benefits, and alternatives to above elective procedure were discussed with the patient, who elected to proceed, and informed consent was accordingly obtained at that time.   DETAILS OF PROCEDURE: Patient was appropriately identified, and informed consent was confirmed to be obtained and documented in the patient's chart. Patient was placed in supine position, and access site was prepped and draped in the usual sterile fashion using chlorhexidine. Access site was identified using direct ultrasound visualization, and 1% lidocaine was injected for local anesthesia. Using standard Seldinger technique, access needle was inserted into the selected access vein with negative pressure applied to the attached syringe, and dark non-pulsatile venous blood was obtained. Loosely attached syringe was disconnected from the access needle, through which guidewire was advanced, over which access needle was withdrawn and dilator was advanced to  skin level. A small skin incision was made adjacent to the guidewire/dilator, allowing dilator to be advanced through subcutaneous tissues and withdrawn over secured guidewire with gentle pressure applied to the access site. Non-tunneled hemodialysis catheter, already flushed with sterile saline was then advanced over the guidewire, confirmed to flush easily, and secured to the patient's skin with 2-0 silk suture. Sterile dressing was applied, and all sharp instruments were appropriately disposed.  Patient tolerated the procedure well and is awaiting hemodialysis.

## 2016-05-03 ENCOUNTER — Encounter (HOSPITAL_COMMUNITY): Payer: Self-pay | Admitting: Anesthesiology

## 2016-05-03 ENCOUNTER — Observation Stay (HOSPITAL_COMMUNITY): Payer: Medicare Other

## 2016-05-03 ENCOUNTER — Encounter (HOSPITAL_COMMUNITY)
Admission: EM | Disposition: A | Discharge status: Home / Self Care | Payer: Self-pay | Source: Home / Self Care | Attending: Emergency Medicine

## 2016-05-03 DIAGNOSIS — N186 End stage renal disease: Secondary | ICD-10-CM | POA: Diagnosis not present

## 2016-05-03 DIAGNOSIS — T82590A Other mechanical complication of surgically created arteriovenous fistula, initial encounter: Secondary | ICD-10-CM | POA: Diagnosis not present

## 2016-05-03 HISTORY — PX: UPPER EXTREMITY ANGIOGRAM: SHX6310

## 2016-05-03 LAB — BASIC METABOLIC PANEL
ANION GAP: 10 (ref 5–15)
BUN: 53 mg/dL — ABNORMAL HIGH (ref 6–20)
CALCIUM: 7.9 mg/dL — AB (ref 8.9–10.3)
CO2: 29 mmol/L (ref 22–32)
Chloride: 98 mmol/L — ABNORMAL LOW (ref 101–111)
Creatinine, Ser: 6.28 mg/dL — ABNORMAL HIGH (ref 0.61–1.24)
GFR, EST AFRICAN AMERICAN: 10 mL/min — AB (ref >60)
GFR, EST NON AFRICAN AMERICAN: 9 mL/min — AB (ref >60)
GLUCOSE: 89 mg/dL (ref 65–99)
Potassium: 4.6 mmol/L (ref 3.5–5.1)
Sodium: 137 mmol/L (ref 135–145)

## 2016-05-03 LAB — HEPATITIS B SURFACE ANTIGEN: HEP B S AG: NEGATIVE

## 2016-05-03 LAB — GLUCOSE, CAPILLARY
GLUCOSE-CAPILLARY: 94 mg/dL (ref 65–99)
Glucose-Capillary: 102 mg/dL — ABNORMAL HIGH (ref 65–99)
Glucose-Capillary: 194 mg/dL — ABNORMAL HIGH (ref 65–99)

## 2016-05-03 LAB — SURGICAL PCR SCREEN
MRSA, PCR: NEGATIVE
Staphylococcus aureus: NEGATIVE

## 2016-05-03 SURGERY — ANGIOGRAM, UPPER EXTREMITY
Anesthesia: LOCAL | Site: Arm Upper | Laterality: Right

## 2016-05-03 MED ORDER — SODIUM CHLORIDE 0.9 % IV SOLN
INTRAVENOUS | Status: DC | PRN
  Administered 2016-05-03: 500 mL via INTRAMUSCULAR

## 2016-05-03 MED ORDER — LIDOCAINE HCL (PF) 1 % IJ SOLN
INTRAMUSCULAR | Status: AC
  Filled 2016-05-03: qty 30

## 2016-05-03 MED ORDER — SODIUM CHLORIDE 0.9 % IV SOLN
Freq: Once | INTRAVENOUS | Status: DC
  Filled 2016-05-03: qty 1.2

## 2016-05-03 MED ORDER — BUPIVACAINE HCL (PF) 0.5 % IJ SOLN
INTRAMUSCULAR | Status: AC
  Filled 2016-05-03: qty 30

## 2016-05-03 MED ORDER — CEFAZOLIN SODIUM-DEXTROSE 2-4 GM/100ML-% IV SOLN
2.0000 g | INTRAVENOUS | Status: DC
  Filled 2016-05-03: qty 100

## 2016-05-03 MED ORDER — IOPAMIDOL (ISOVUE-300) INJECTION 61%
INTRAVENOUS | Status: DC | PRN
  Administered 2016-05-03 (×2): 50 mL via INTRAVENOUS

## 2016-05-03 MED ORDER — HEPARIN SODIUM (PORCINE) 1000 UNIT/ML IJ SOLN
INTRAMUSCULAR | Status: AC
  Filled 2016-05-03: qty 3

## 2016-05-03 MED ORDER — BUPIVACAINE HCL (PF) 0.5 % IJ SOLN
INTRAMUSCULAR | Status: DC | PRN
  Administered 2016-05-03: 3 mL

## 2016-05-03 MED ORDER — CHLORHEXIDINE GLUCONATE CLOTH 2 % EX PADS
6.0000 | MEDICATED_PAD | Freq: Once | CUTANEOUS | Status: AC
  Administered 2016-05-03: 6 via TOPICAL

## 2016-05-03 MED ORDER — SODIUM CHLORIDE 0.9 % IV SOLN
INTRAVENOUS | Status: DC | PRN
  Administered 2016-05-03: 6 mL

## 2016-05-03 MED ORDER — IOPAMIDOL (ISOVUE-300) INJECTION 61%
INTRAVENOUS | Status: AC
  Filled 2016-05-03: qty 50

## 2016-05-03 MED ORDER — HEPARIN SODIUM (PORCINE) 5000 UNIT/ML IJ SOLN
INTRAMUSCULAR | Status: DC | PRN
  Administered 2016-05-03: 3000 [IU] via INTRAVENOUS

## 2016-05-03 SURGICAL SUPPLY — 65 items
BAG BANDED W/RUBBER/TAPE 36X54 (MISCELLANEOUS) IMPLANT
BAG DECANTER FOR FLEXI CONT (MISCELLANEOUS) ×2 IMPLANT
BALLN MUSTANG 6.0X40 75 (BALLOONS) ×2
BALLN MUSTANG 8.0X40 75 (BALLOONS) ×2
BALLOON MUSTANG 6.0X40 75 (BALLOONS) ×1 IMPLANT
BALLOON MUSTANG 8.0X40 75 (BALLOONS) ×1 IMPLANT
BANDAGE ELASTIC 4 VELCRO ST LF (GAUZE/BANDAGES/DRESSINGS) IMPLANT
BANDAGE ESMARK 6X9 LF (GAUZE/BANDAGES/DRESSINGS) IMPLANT
BNDG COHESIVE 4X5 TAN STRL (GAUZE/BANDAGES/DRESSINGS) ×2 IMPLANT
BNDG ESMARK 6X9 LF (GAUZE/BANDAGES/DRESSINGS)
CANISTER SUCTION 2500CC (MISCELLANEOUS) IMPLANT
CHLORAPREP W/TINT 26ML (MISCELLANEOUS) ×4 IMPLANT
COVER PROBE W GEL 5X96 (DRAPES) IMPLANT
DECANTER SPIKE VIAL GLASS SM (MISCELLANEOUS) ×4 IMPLANT
DEVICE INFLATION ENCORE 26 (MISCELLANEOUS) ×2 IMPLANT
DRAIN CHANNEL 15F RND FF W/TCR (WOUND CARE) IMPLANT
DRAPE C-ARM 42X72 X-RAY (DRAPES) ×2 IMPLANT
DRAPE PROXIMA HALF (DRAPES) IMPLANT
DRSG COVADERM 4X10 (GAUZE/BANDAGES/DRESSINGS) IMPLANT
DRSG COVADERM 4X8 (GAUZE/BANDAGES/DRESSINGS) IMPLANT
DRSG TEGADERM 2-3/8X2-3/4 SM (GAUZE/BANDAGES/DRESSINGS) ×2 IMPLANT
ELECT REM PT RETURN 9FT ADLT (ELECTROSURGICAL)
ELECTRODE REM PT RTRN 9FT ADLT (ELECTROSURGICAL) IMPLANT
EVACUATOR DRAINAGE 10X20 100CC (DRAIN) IMPLANT
EVACUATOR SILICONE 100CC (DRAIN)
GLOVE BIO SURGEON STRL SZ7 (GLOVE) IMPLANT
GLOVE BIOGEL PI IND STRL 7.5 (GLOVE) ×1 IMPLANT
GLOVE BIOGEL PI INDICATOR 7.5 (GLOVE) ×1
GLOVE ECLIPSE 6.5 STRL STRAW (GLOVE) ×2 IMPLANT
GLOVE SURG SS PI 7.0 STRL IVOR (GLOVE) ×2 IMPLANT
GOWN STRL REUS W/ TWL LRG LVL3 (GOWN DISPOSABLE) ×2 IMPLANT
GOWN STRL REUS W/TWL LRG LVL3 (GOWN DISPOSABLE) ×2
GUIDEWIRE ANGLED .035 180CM (WIRE) ×4 IMPLANT
HEMOSTAT SPONGE AVITENE ULTRA (HEMOSTASIS) IMPLANT
INSERT FOGARTY SM (MISCELLANEOUS) IMPLANT
KIT ROOM TURNOVER APOR (KITS) ×2 IMPLANT
MARKER GRAFT CORONARY BYPASS (MISCELLANEOUS) IMPLANT
PACK BASIC LIMB (CUSTOM PROCEDURE TRAY) ×2 IMPLANT
PAD ARMBOARD 7.5X6 YLW CONV (MISCELLANEOUS) ×4 IMPLANT
SCALPEL PROTECTED #11 DISP (BLADE) ×2 IMPLANT
SET BASIN LINEN APH (SET/KITS/TRAYS/PACK) ×2 IMPLANT
SHEATH PINNACLE R/O II 6F 4CM (SHEATH) ×2 IMPLANT
SPONGE GAUZE 2X2 8PLY STRL LF (GAUZE/BANDAGES/DRESSINGS) ×2 IMPLANT
STOPCOCK 4 WAY LG BORE MALE ST (IV SETS) IMPLANT
SUT ETHILON 3 0 PS 1 (SUTURE) IMPLANT
SUT GORETEX 6.0 TT13 (SUTURE) IMPLANT
SUT MNCRL AB 4-0 PS2 18 (SUTURE) IMPLANT
SUT PROLENE 5 0 C 1 24 (SUTURE) IMPLANT
SUT PROLENE 6 0 BV (SUTURE) IMPLANT
SUT PROLENE 7 0 BV 1 (SUTURE) IMPLANT
SUT SILK 2 0 FS (SUTURE) IMPLANT
SUT SILK 3 0 (SUTURE)
SUT SILK 3-0 18XBRD TIE 12 (SUTURE) IMPLANT
SUT VIC AB 2-0 CT1 27 (SUTURE)
SUT VIC AB 2-0 CT1 TAPERPNT 27 (SUTURE) IMPLANT
SUT VIC AB 3-0 SH 27 (SUTURE)
SUT VIC AB 3-0 SH 27X BRD (SUTURE) IMPLANT
SYR 30ML LL (SYRINGE) ×2 IMPLANT
SYRINGE 10CC LL (SYRINGE) ×2 IMPLANT
TRAY FOLEY W/METER SILVER 16FR (SET/KITS/TRAYS/PACK) IMPLANT
TUBING CIL FLEX 10 FLL-RA (TUBING) IMPLANT
TUBING EXTENTION W/L.L. (IV SETS) IMPLANT
UNDERPAD 30X30 INCONTINENT (UNDERPADS AND DIAPERS) IMPLANT
WATER STERILE IRR 1000ML POUR (IV SOLUTION) IMPLANT
WIRE GUIDE BENTSON .035 15CM (WIRE) ×2 IMPLANT

## 2016-05-03 NOTE — Progress Notes (Signed)
Right femoral vein non-tunneled hemodialysis cathter removed uneventfully and without difficulty, direct pressure was applied for hemostasis, and sterile gauze and adhesive dressing was applied. Patient instructed to lay flat (bedrest) x 1 hour, discussed with RN, who expresses understanding, agrees to check site for ongoing hemostasis and no hematoma. Patient left resting comfortably.  -- Marilynne Drivers Rosana Hoes, MD, [place]: [place] and Vascular Surgery Office #: [phone]

## 2016-05-03 NOTE — Progress Notes (Signed)
  PROGRESS NOTE  Nathaniel Johnston [id number] DOB: 1956-03-19 DOA: 05/02/2016 PCP: No PCP Per Patient  Brief Narrative: 72 yom presented with complaints of complications with dialysis graft. Pt developed a nonfunctioning fistula 8/5 and was recommended by nephrology to return 8/6 for catheter replacement. Pt has returned for hemodialysis. Pt was admitted for further management of end stage renal disease.   Assessment/Plan: 1. ESRD on HD with nonfunctioning AVF. 2. DM, remains stable 3. PMH CVA 4. Cecal adenocarcinoma   Medical issues stable. Discharge when cleared by surgery.  DVT prophylaxis: Heparin Code Status: DNR Family Communication: No family bedside Disposition Plan: OBS   Murray Hodgkins, MD  [facility] Direct contact: [phone] --Via amion app OR  --[email or web address]; password [email or web address]  7PM-7AM contact night coverage as above 05/03/2016, 5:42 AM  LOS: 0 days   Consultants:  Nephrology  Procedures:  HD  Antimicrobials:  None   HPI/Subjective: Feels weak, hungry. No other complaints.  Objective: Vitals:   05/02/16 1745 05/02/16 1815 05/02/16 1845 05/02/16 2250  BP: (!) 97/50 100/55 105/63 111/68  Pulse: 70 69 66 70  Resp:   16 16  Temp:   97.8 F (36.6 C) 98.3 F (36.8 C)  TempSrc:   Oral Oral  SpO2:    100%  Weight:      Height:        Intake/Output Summary (Last 24 hours) at 05/03/16 0542 Last data filed at 05/02/16 1845  Gross per 24 hour  Intake                0 ml  Output             3083 ml  Net            -3083 ml     Filed Weights   05/02/16 1116 05/02/16 1440  Weight: 114.3 kg (252 lb) 114.3 kg (251 lb 15.8 oz)    Exam:     Constitutional:  . Appears calm and comfortable Respiratory:  . CTA bilaterally, no w/r/r.  . Respiratory effort normal. No retractions or accessory muscle use Cardiovascular:  . RRR, no m/r/g  I have personally reviewed following labs and imaging studies:  EKG SR, no acute changes  BUN 53 Cr  6.28, Potassium normal  Scheduled Meds: . calcium acetate  667 mg Oral TID WC  .  ceFAZolin (ANCEF) IV  2 g Intravenous On Call to OR  . Chlorhexidine Gluconate Cloth  6 each Topical Once   And  . Chlorhexidine Gluconate Cloth  6 each Topical Once  . insulin aspart  0-9 Units Subcutaneous TID WC  . sevelamer carbonate  800 mg Oral TID WC   Continuous Infusions:   Active Problems:   ESRD (end stage renal disease) ([place])   LOS: 0 days   Time spent 25 minutes   By signing my name below, I, Collene Leyden, attest that this documentation has been prepared under the direction and in the presence of Murray Hodgkins, MD. Electronically signed: Collene Leyden, [place] 05/03/16 11:46 AM    I personally performed the services described in this documentation. All medical record entries made by the scribe were at my direction. I have reviewed the chart and agree that the record reflects my personal performance and is accurate and complete. Murray Hodgkins, MD

## 2016-05-03 NOTE — Op Note (Signed)
SURGICAL ANGIOGRAPY/OPERATIVE REPORT   DATE OF PROCEDURE: 05/03/2016   ATTENDING SURGEON: Corene Cornea E. Rosana Hoes, MD  ANESTHESIA: Local   PRE-OPERATIVE DIAGNOSIS: Right upper extremity brachial-basilic AV fistula dysfunction  POST-OPERATIVE DIAGNOSIS: Right upper extremity brachial-basilic AV fistula dysfunction  PROCEDURE(S):  1.) Percutaneous antegrade access of RUE transposed brachial-basilic AV fistula  2.) AV fistulogram with and without compression  3.) Central venogram  4.) Angioplasty of mid-basilic vein and basilic-brachial venous junction with 6 mm x 40 mm Mustang angioplasty balloon  5.) Angioplasty of mid-basilic vein and basilic-brachial venous junction with 8 mm x 40 mm Mustang angioplasty balloon   INTRAOPERATIVE FINDINGS: Patent Right upper extremity transposed brachial-basilic AV fistula with patent ~17mm x 5 cm Viabahn covered stent graft in the mid-basilic vein outflow up until 2 cm from the junction of the basilic and brachial veins with a 60% hemodynamically significant stenosis of the basilic vein on proximal side of the stent (towards the anastomosis) and a 50% stenosis of the basilic vein on the distal side of the stent (towards central venous outflow); patent central venous outflow into the SVC with another patent stent between the Right axillary vein to the Right innominate vein without evidence of stenosis; no (123XX123) residual basilic vein stenosis on completion angiography  INTRAOPERATIVE FLUIDS: 0 mL crystalloid and 40 mL contrast used   FLUOROSCOPY: 4.24 minutes and 16.02 mGy   ESTIMATED BLOOD LOSS: Minimal (<20 mL)   SPECIMENS: None   IMPLANTS: None   DRAINS: None   COMPLICATIONS: None apparent   CONDITION AT COMPLETION: Hemodynamically stable, awake   PULSE/DOPPLER EXAM AT END OF PROCEDURE:   (p=palpable; d=doppler signals; 0=none)             Right   AVF  easily palpable non-pulsatile thrill   Brach    p   Rad    p   DISPOSITION: Recovery    INDICATION(S) FOR PROCEDURE:  Patient is a right-handed 60 y.o. male with ESRD on Tuesday/Thursday/Saturday hemodialysis via RUE brachial-basilic AV fistula. Reportedly 3 weeks after AV fistula revision for resection of a fistula aneurysm (no documentation available), dialysis RN was unable to successfully cannulate the fistula for hemodialysis, and patient was referred to [facility]. A non-tunneled Right femoral vein hemodialysis catheter was inserted, through which dialysis was completed, and patient accordingly presents for angiographic evaluation of AV fistula with possible intervention. All risks, benefits, and alternatives to above elective procedures were discussed with the patient, who elected to proceed, and informed consent was accordingly obtained at that time.   DETAILS OF PROCEDURE:  Patient was brought to the interventional suite and appropriately identified. In supine position, access sites were prepped and draped in the usual sterile fashion, and following a brief timeout, percutaneous RUE AV fistula was accessed in antegrade fashion using Seldinger technique, by which local anesthetic was injected over the fistula venous access within 2-3 cm of the anastomosis, and micropuncture access needle was inserted into the fistula, through which micropuncture guidewire was advanced, over which the access needle was withdrawn, and micropuncture sheath was advanced and flushed with heparinized saline. Through the micropuncture sheath, RUE fistulagram, was performed with visualization of patent Right upper extremity transposed brachial-basilic AV fistula with patent ~90mm x 5 cm Viabahn covered stent graft in the mid-basilic vein outflow up until 2 cm from the junction of the basilic and brachial veins with a 60% hemodynamically significant stenosis of the basilic vein on proximal side of the stent (towards the anastomosis) and a 50% stenosis of  the basilic vein on the distal side of the stent (towards  central venous outflow) and patent central venous outflow into the SVC with another patent stent between the Right axillary vein to the Right innominate vein without evidence of stenosis. Fistula was then compressed, and angiogram revealed no hemodynamically significant stenosis at the anastomosis.   Bentson guidewire was advanced into the AV fistula venous outflow and into the Right subclavian vein, over which the micropuncture sheath was removed and exchanged for a 6 F x 4 cm sheath, which was flushed with heparinized saline and through which a 6 mm x 40 mm Mustang angioplasty balloon was advanced over the guidewire across the venous outflow stenosis and inflated, followed by the same with a 8 mm x 40 mm Mustang angioplasty balloon with minimal (<10%) residual stenosis on completion angiogram.   No further interventions were indicated at this time. A single 4-0 Monocryl U-stitch was placed in skin surrounding the sheath, which was withdrawn, and brief direct pressure was applied for hemostasis.   I was present for all aspects of the procedures, and there were no intraprocedural complications apparent.

## 2016-05-03 NOTE — Progress Notes (Signed)
Subjective: Interval History: has no complaint of nausea or vomiting. Patient also denies any difficulty breathing..  Objective: Vital signs in last 24 hours: Temp:  [97.5 F (36.4 C)-98.4 F (36.9 C)] 98.4 F (36.9 C) (08/07 0619) Pulse Rate:  [66-82] 69 (08/07 0619) Resp:  [14-16] 16 (08/07 0619) BP: (97-157)/(50-77) 131/60 (08/07 0619) SpO2:  [97 %-100 %] 99 % (08/07 0619) Weight:  [114.3 kg (251 lb 15.8 oz)-114.3 kg (252 lb)] 114.3 kg (251 lb 15.8 oz) (08/06 1440) Weight change:   Intake/Output from previous day: 08/06 0701 - 08/07 0700 In: -  Out: 3083  Intake/Output this shift: No intake/output data recorded.  General appearance: alert, cooperative and no distress Resp: clear to auscultation bilaterally Cardio: regular rate and rhythm, S1, S2 normal, no murmur, click, rub or gallop Extremities: edema He has 1+ edema and venous stasis  Lab Results: No results for input(s): WBC, HGB, HCT, PLT in the last 72 hours. BMET:  Recent Labs  05/01/16 1432 05/03/16 0609  NA 136 137  K 5.2* 4.6  CL 96* 98*  CO2 27 29  GLUCOSE 187* 89  BUN 59* 53*  CREATININE 7.38* 6.28*  CALCIUM 9.3 7.9*   No results for input(s): PTH in the last 72 hours. Iron Studies: No results for input(s): IRON, TIBC, TRANSFERRIN, FERRITIN in the last 72 hours.  Studies/Results: [place] Upper Ext Art Right Ltd  Result Date: 05/02/2016 CLINICAL DATA:  60 year old male with bilateral shoulder pain and inability to access right upper arm hemodialysis access EXAM: RIGHT UPPER EXTREMITY ARTERIAL DUPLEX SCAN TECHNIQUE: Gray-scale sonography as well as color Doppler and duplex ultrasound was performed to evaluate the arteries of the upper extremity. COMPARISON:  None. FINDINGS: Sonographic interrogation of the brachial artery to basilic vein arteriovenous graft. The arterial anastomosis is widely patent. There is elevation of the peak systolic velocity to A999333 centimeters/second compared to 123 centimeters/second  in the brachial artery proximally. The graft demonstrates partial peripheral calcification resulting in shadowing on some images. There is evidence of a narrowing at the venous anastomosis with a significantly elevated peak systolic velocity at XX123456 centimeters/second, diffuse spectral broadening and aliasing. This is consistent with a hemodynamically significant stenosis. At a point labeled as the access site there is peripheral heterogeneous echogenicity surrounding the graft material likely representing old hematoma. Additionally, there is some a documented elevation of the peak systolic velocity at the apex of the loop graft. This may be artifactual and related to the curvature of the graft. IMPRESSION: 1. Patent right brachiobasilic arteriovenous loop graft. 2. 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 3. Also suspect a more moderate stenosis at the arterial anastomosis. 4. Old hematoma surrounds the graft in the region of the 1 of the cannulation sites. Signed, Criselda Peaches, MD [facility]iology Electronically Signed   By: Jacqulynn Cadet M.D.   On: 05/02/2016 08:42    I have reviewed the patient's current medications.  Assessment/Plan: Problem #1 end-stage renal disease: He is status post hemodialysis yesterday. Presently patient is asymptomatic. Problem #2 clotted AV graft. Patient for angioplasty today to see if the graft is salvageable. If not patient may require a tunneled catheter for dialysis. Problem #3 diabetes Problem #4 hypertension his blood pressure is reasonably controlled Problem #5 history of colonic cancer Problem# 6 anemia: His hemoglobin is low and patient is on Epogen. Problem #7 metabolic on disease: His calcium and phosphorus is range. Plan: 1] Patient doesn't require dialysis today 2] his regular  dialysis will be tomorrow. If he is discharged today to go to his regular dialysis as an outpatient. If not we  will make arrangements for patient to get dialysis tomorrow.    LOS: 0 days   Seyon Strader S 05/03/2016,7:53 AM

## 2016-05-03 NOTE — OR Nursing (Signed)
Report called to [place], RN - transported patient via stretcher by t.Devine Dant,rn, c. Wrenn, rn with pillow & foam pad under patient's right arm extended straight out; 2x2 with 4x4 opsite for dressing

## 2016-05-03 NOTE — Care Management Obs Status (Signed)
[place] NOTIFICATION   Patient Details  Name: DAMETRI DRANE MRN: [id number] Date of Birth: [birth year]   Medicare Observation Status Notification Given:  No (Pt off floor in procedure for remainder of the day)    Sherald Barge, RN 05/03/2016, 3:27 PM

## 2016-05-03 NOTE — Discharge Summary (Signed)
Physician Discharge Summary  Nathaniel Johnston [id number] DOB: 02/14/1956 DOA: 05/02/2016  PCP: No PCP Per Patient  Admit date: 05/02/2016 Discharge date: 05/04/2016  Recommendations for Outpatient Follow-up:  1. Continue scheduled outpatient dialysis    Follow-up Information    Vickie Epley, MD. Schedule an appointment as soon as possible for a visit in 2 week(s).   Specialty:  General Surgery Contact information: [place] [facility] [address] [phone]          Discharge Diagnoses:  1. ESRD With nonfunctioning AV graft. 2. DM  3. Cecal adenocarcinoma   Discharge Condition: Stable  Disposition: Home   Diet recommendation: Heart healthy   Filed Weights   05/02/16 1116 05/02/16 1440 05/03/16 1220  Weight: 114.3 kg (252 lb) 114.3 kg (251 lb 15.8 oz) 114.3 kg (252 lb)    History of present illness:  60 year old man with end-stage renal disease who developed a nonfunctioning fistula 8/5. He was seen in the emergency department by surgery as well as nephrology and recommendation at that time was to return 8/6 for catheter placement and dialysis. Patient returned for hemodialysis. No acute issues. Hospitalist service was asked to observe the patient  Hospital Course:  Patient was seen by nephrology and surgery. He underwent hemodialysis without complication. He underwent procedure on AV graft without complication and was cleared for discharge by the surgeon. Hospitalization was uncomplicated, comorbidities remain stable.  1. ESRD on HD with nonfunctioning AVF. 2. DM  3. PMH CVA  4. Cecal adenocarcinoma   Consultants:  Nephrology  PROCEDURE(S):  1.) Percutaneous antegrade access of RUE transposed brachial-basilic AV fistula  2.) AV fistulogram with and without compression  3.) Central venogram  4.) Angioplasty of mid-basilic vein and basilic-brachial venous junction with 6 mm x 40 mm Mustang angioplasty balloon  5.) Angioplasty of mid-basilic vein  and basilic-brachial venous junction with 8 mm x 40 mm Mustang angioplasty balloon    Discharge Instructions  Discharge Instructions    Activity as tolerated - No restrictions    Complete by:  As directed   Diet - low sodium heart healthy    Complete by:  As directed       Medication List    TAKE these medications   amLODipine 10 MG tablet Commonly known as:  NORVASC Take 2 tablets by mouth daily.   calcium acetate 667 MG capsule Commonly known as:  PHOSLO Take 1 capsule by mouth 3 (three) times daily with meals.   dexamethasone 4 MG tablet Commonly known as:  DECADRON Take 2 tablets (8 mg total) by mouth daily. Start the day after chemo for 2 days.   ERBITUX IV Inject into the vein. Every 2 weeks   eucerin cream Apply topically.   fluorouracil CALGB 60454 in sodium chloride 0.9 % 150 mL Inject into the vein over 24 hr.   glipiZIDE 10 MG tablet Commonly known as:  GLUCOTROL Take 1 tablet by mouth.   HYDROcodone-acetaminophen 5-325 MG tablet Commonly known as:  NORCO/VICODIN Take 1 tablet by mouth every 6 (six) hours as needed for moderate pain.   irinotecan in dextrose 5 % 500 mL Inject into the vein once. Every 2 weeks   Iron Polysacch Cmplx-B12-FA 150-0.025-1 MG Caps Take 1 capsule by mouth.   labetalol 300 MG tablet Commonly known as:  NORMODYNE Take 2 tablets by mouth.   LEUCOVORIN CALCIUM IJ Inject as directed. Every 2 weeks   lisinopril 40 MG tablet Commonly known as:  PRINIVIL,ZESTRIL Take  1 tablet by mouth.   loperamide 2 MG capsule Commonly known as:  IMODIUM Take 2 capsules after the first loose stool and then 1 capsule every 2 hours until you go a total of 12 hours without having a loose stool. If it is bedtime and you are having loose stools take 2 capsules every 4 hours until morning. Call [place]. What changed:  Another medication with the same name was removed. Continue taking this medication, and follow the directions you see  here.   ondansetron 8 MG tablet Commonly known as:  ZOFRAN Take 1 tablet (8 mg total) by mouth 2 (two) times daily as needed for refractory nausea / vomiting. Start on day 3 after chemotherapy.   potassium chloride SA 20 MEQ tablet Commonly known as:  K-DUR,KLOR-CON Take 1 tablet (20 mEq total) by mouth daily.   prochlorperazine 10 MG tablet Commonly known as:  COMPAZINE Take 1 tablet (10 mg total) by mouth every 6 (six) hours as needed (NAUSEA).   RENVELA 800 MG tablet Generic drug:  sevelamer carbonate Take 800 mg by mouth 3 (three) times daily with meals.   simvastatin 80 MG tablet Commonly known as:  ZOCOR Take 1 tablet by mouth.   triamcinolone cream 0.1 % Commonly known as:  KENALOG Apply topically.      No Known Allergies  The results of significant diagnostics from this hospitalization (including imaging, microbiology, ancillary and laboratory) are listed below for reference.    Significant Diagnostic Studies: Chest 2 View  Result Date: 05/03/2016 CLINICAL DATA:  Preop fistula declot EXAM: CHEST  2 VIEW COMPARISON:  03/24/2016 FINDINGS: There is a vascular stent identified along the course of the right subclavian vein. Left chest wall port a catheter is noted with tip in the projection of the cavoatrial junction. Normal heart size. Linear scar like densities noted in the left midlung and right base. IMPRESSION: 1. No acute cardiopulmonary abnormalities. Electronically Signed   By: Kerby Moors M.D.   On: 05/03/2016 09:30   [place] Upper Ext Art Right Ltd  Result Date: 05/02/2016 CLINICAL DATA:  60 year old male with bilateral shoulder pain and inability to access right upper arm hemodialysis access EXAM: RIGHT UPPER EXTREMITY ARTERIAL DUPLEX SCAN TECHNIQUE: Gray-scale sonography as well as color Doppler and duplex ultrasound was performed to evaluate the arteries of the upper extremity. COMPARISON:  None. FINDINGS: Sonographic interrogation of the brachial artery to basilic  vein arteriovenous graft. The arterial anastomosis is widely patent. There is elevation of the peak systolic velocity to [id number] centimeters/second compared to 123 centimeters/second in the brachial artery proximally. The graft demonstrates partial peripheral calcification resulting in shadowing on some images. There is evidence of a narrowing at the venous anastomosis with a significantly elevated peak systolic velocity at XX123456 centimeters/second, diffuse spectral broadening and aliasing. This is consistent with a hemodynamically significant stenosis. At a point labeled as the access site there is peripheral heterogeneous echogenicity surrounding the graft material likely representing old hematoma. Additionally, there is some a documented elevation of the peak systolic velocity at the apex of the loop graft. This may be artifactual and related to the curvature of the graft. IMPRESSION: 1. Patent right brachiobasilic arteriovenous loop graft. 2. 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 3. Also suspect a more moderate stenosis at the arterial anastomosis. 4. Old hematoma surrounds the graft in the region of the 1 of the cannulation sites. Signed, Criselda Peaches, MD [facility] Electronically Signed   By:  Jacqulynn Cadet M.D.   On: 05/02/2016 08:42     Labs: Basic Metabolic Panel:  Recent Labs Lab 04/29/16 1124 05/01/16 1432 05/03/16 0609  NA 137 136 137  K 3.9 5.2* 4.6  CL 100* 96* 98*  CO2 30 27 29   GLUCOSE 122* 187* 89  BUN 21* 59* 53*  CREATININE 4.58* 7.38* 6.28*  CALCIUM 8.6* 9.3 7.9*  MG 2.6* 3.5*  --   PHOS  --  4.7*  --    Liver Function Tests:  Recent Labs Lab 04/29/16 1124  AST 19  ALT 14*  ALKPHOS 55  BILITOT 0.2*  PROT 7.1  ALBUMIN 3.6   CBC:  Recent Labs Lab 04/29/16 1124  WBC 5.2  NEUTROABS 3.4  HGB 8.3*  HCT 25.8*  MCV 103.2*  PLT 242    CBG:  Recent Labs Lab 05/03/16 0900  05/03/16 1157 05/03/16 2036  GLUCAP 94 102* 194*    Active Problems:   ESRD (end stage renal disease) ([place])   Time coordinating discharge: 20 minutes   Signed:  Murray Hodgkins, MD [facility] 05/04/2016, 4:29 PM By signing my name below, I, Collene Leyden, attest that this documentation has been prepared under the direction and in the presence of Murray Hodgkins, MD. Electronically signed: Collene Leyden, [place] 05/04/16 11:56 AM    I personally performed the services described in this documentation. All medical record entries made by the scribe were at my direction. I have reviewed the chart and agree that the record reflects my personal performance and is accurate and complete. Murray Hodgkins, MD

## 2016-05-04 ENCOUNTER — Other Ambulatory Visit: Payer: Self-pay

## 2016-05-04 ENCOUNTER — Inpatient Hospital Stay (HOSPITAL_COMMUNITY)
Admission: EM | Admit: 2016-05-04 | Discharge: 2016-05-07 | DRG: 377 | Disposition: A | Discharge status: Home / Self Care | Payer: Medicare Other | Attending: Internal Medicine | Admitting: Internal Medicine

## 2016-05-04 ENCOUNTER — Encounter (HOSPITAL_COMMUNITY): Payer: Self-pay

## 2016-05-04 DIAGNOSIS — E7800 Pure hypercholesterolemia, unspecified: Secondary | ICD-10-CM | POA: Diagnosis not present

## 2016-05-04 DIAGNOSIS — C779 Secondary and unspecified malignant neoplasm of lymph node, unspecified: Secondary | ICD-10-CM | POA: Diagnosis not present

## 2016-05-04 DIAGNOSIS — I69354 Hemiplegia and hemiparesis following cerebral infarction affecting left non-dominant side: Secondary | ICD-10-CM

## 2016-05-04 DIAGNOSIS — K219 Gastro-esophageal reflux disease without esophagitis: Secondary | ICD-10-CM | POA: Diagnosis not present

## 2016-05-04 DIAGNOSIS — D631 Anemia in chronic kidney disease: Secondary | ICD-10-CM | POA: Diagnosis present

## 2016-05-04 DIAGNOSIS — Z992 Dependence on renal dialysis: Secondary | ICD-10-CM

## 2016-05-04 DIAGNOSIS — K922 Gastrointestinal hemorrhage, unspecified: Secondary | ICD-10-CM | POA: Diagnosis not present

## 2016-05-04 DIAGNOSIS — Z87891 Personal history of nicotine dependence: Secondary | ICD-10-CM | POA: Diagnosis not present

## 2016-05-04 DIAGNOSIS — Z66 Do not resuscitate: Secondary | ICD-10-CM | POA: Diagnosis present

## 2016-05-04 DIAGNOSIS — Z9049 Acquired absence of other specified parts of digestive tract: Secondary | ICD-10-CM | POA: Diagnosis not present

## 2016-05-04 DIAGNOSIS — C180 Malignant neoplasm of cecum: Secondary | ICD-10-CM | POA: Diagnosis not present

## 2016-05-04 DIAGNOSIS — E1122 Type 2 diabetes mellitus with diabetic chronic kidney disease: Secondary | ICD-10-CM | POA: Diagnosis present

## 2016-05-04 DIAGNOSIS — Z9221 Personal history of antineoplastic chemotherapy: Secondary | ICD-10-CM

## 2016-05-04 DIAGNOSIS — I120 Hypertensive chronic kidney disease with stage 5 chronic kidney disease or end stage renal disease: Secondary | ICD-10-CM | POA: Diagnosis present

## 2016-05-04 DIAGNOSIS — E8889 Other specified metabolic disorders: Secondary | ICD-10-CM | POA: Diagnosis present

## 2016-05-04 DIAGNOSIS — D649 Anemia, unspecified: Secondary | ICD-10-CM

## 2016-05-04 DIAGNOSIS — D62 Acute posthemorrhagic anemia: Secondary | ICD-10-CM

## 2016-05-04 DIAGNOSIS — K921 Melena: Principal | ICD-10-CM | POA: Diagnosis present

## 2016-05-04 DIAGNOSIS — R040 Epistaxis: Secondary | ICD-10-CM | POA: Diagnosis not present

## 2016-05-04 DIAGNOSIS — N186 End stage renal disease: Secondary | ICD-10-CM | POA: Diagnosis not present

## 2016-05-04 DIAGNOSIS — T451X5A Adverse effect of antineoplastic and immunosuppressive drugs, initial encounter: Secondary | ICD-10-CM | POA: Diagnosis present

## 2016-05-04 DIAGNOSIS — D6481 Anemia due to antineoplastic chemotherapy: Secondary | ICD-10-CM | POA: Diagnosis not present

## 2016-05-04 DIAGNOSIS — T82590A Other mechanical complication of surgically created arteriovenous fistula, initial encounter: Secondary | ICD-10-CM

## 2016-05-04 LAB — COMPREHENSIVE METABOLIC PANEL
ALK PHOS: 44 U/L (ref 38–126)
ALT: 15 U/L — AB (ref 17–63)
AST: 19 U/L (ref 15–41)
Albumin: 3.1 g/dL — ABNORMAL LOW (ref 3.5–5.0)
Anion gap: 9 (ref 5–15)
BUN: 48 mg/dL — ABNORMAL HIGH (ref 6–20)
CALCIUM: 7.6 mg/dL — AB (ref 8.9–10.3)
CO2: 28 mmol/L (ref 22–32)
CREATININE: 5.75 mg/dL — AB (ref 0.61–1.24)
Chloride: 98 mmol/L — ABNORMAL LOW (ref 101–111)
GFR, EST AFRICAN AMERICAN: 11 mL/min — AB (ref >60)
GFR, EST NON AFRICAN AMERICAN: 10 mL/min — AB (ref >60)
Glucose, Bld: 145 mg/dL — ABNORMAL HIGH (ref 65–99)
Potassium: 3.8 mmol/L (ref 3.5–5.1)
Sodium: 135 mmol/L (ref 135–145)
TOTAL PROTEIN: 5.8 g/dL — AB (ref 6.5–8.1)
Total Bilirubin: 0.3 mg/dL (ref 0.3–1.2)

## 2016-05-04 LAB — CBC WITH DIFFERENTIAL/PLATELET
BASOS PCT: 0 %
Basophils Absolute: 0 10*3/uL (ref 0.0–0.1)
EOS ABS: 0.1 10*3/uL (ref 0.0–0.7)
Eosinophils Relative: 4 %
HCT: 19.7 % — ABNORMAL LOW (ref 39.0–52.0)
HEMOGLOBIN: 6.5 g/dL — AB (ref 13.0–17.0)
Lymphocytes Relative: 14 %
Lymphs Abs: 0.5 10*3/uL — ABNORMAL LOW (ref 0.7–4.0)
MCH: 32.8 pg (ref 26.0–34.0)
MCHC: 33 g/dL (ref 30.0–36.0)
MCV: 99.5 fL (ref 78.0–100.0)
Monocytes Absolute: 0.1 10*3/uL (ref 0.1–1.0)
Monocytes Relative: 2 %
NEUTROS PCT: 80 %
Neutro Abs: 3 10*3/uL (ref 1.7–7.7)
Platelets: 159 10*3/uL (ref 150–400)
RBC: 1.98 MIL/uL — AB (ref 4.22–5.81)
RDW: 15.5 % (ref 11.5–15.5)
WBC: 3.7 10*3/uL — AB (ref 4.0–10.5)

## 2016-05-04 LAB — POC OCCULT BLOOD, ED: Fecal Occult Bld: POSITIVE — AB

## 2016-05-04 LAB — TROPONIN I: TROPONIN I: 0.03 ng/mL — AB (ref <0.03)

## 2016-05-04 LAB — ABO/RH: ABO/RH(D): A POS

## 2016-05-04 LAB — PREPARE RBC (CROSSMATCH)

## 2016-05-04 LAB — MAGNESIUM: MAGNESIUM: 2.5 mg/dL — AB (ref 1.7–2.4)

## 2016-05-04 MED ORDER — SODIUM CHLORIDE 0.9 % IV SOLN
Freq: Once | INTRAVENOUS | Status: AC
  Administered 2016-05-04: 21:00:00 via INTRAVENOUS

## 2016-05-04 MED ORDER — POLYSACCHARIDE IRON COMPLEX 150 MG PO CAPS
150.0000 mg | ORAL_CAPSULE | Freq: Every day | ORAL | Status: DC
  Administered 2016-05-05: 150 mg via ORAL
  Filled 2016-05-04: qty 1

## 2016-05-04 MED ORDER — SODIUM CHLORIDE 0.9 % IV SOLN
8.0000 mg/h | INTRAVENOUS | Status: DC
  Administered 2016-05-04: 8 mg/h via INTRAVENOUS
  Filled 2016-05-04 (×7): qty 80

## 2016-05-04 MED ORDER — HYDROCODONE-ACETAMINOPHEN 5-325 MG PO TABS: 1.0000 | ORAL_TABLET | Freq: Four times a day (QID) | ORAL | Status: DC | PRN

## 2016-05-04 MED ORDER — PANTOPRAZOLE SODIUM 40 MG IV SOLR: 40.0000 mg | Freq: Two times a day (BID) | INTRAVENOUS | Status: DC

## 2016-05-04 MED ORDER — AMLODIPINE BESYLATE 5 MG PO TABS
20.0000 mg | ORAL_TABLET | Freq: Every day | ORAL | Status: DC
  Administered 2016-05-06 – 2016-05-07 (×2): 20 mg via ORAL
  Filled 2016-05-04 (×2): qty 4

## 2016-05-04 MED ORDER — LABETALOL HCL 200 MG PO TABS
300.0000 mg | ORAL_TABLET | Freq: Two times a day (BID) | ORAL | Status: DC
  Administered 2016-05-04 – 2016-05-07 (×4): 300 mg via ORAL
  Filled 2016-05-04 (×4): qty 2

## 2016-05-04 MED ORDER — IRON POLYSACCH CMPLX-B12-FA 150-0.025-1 MG PO CAPS: 1.0000 | ORAL_CAPSULE | Freq: Every day | ORAL | Status: DC

## 2016-05-04 MED ORDER — DEXAMETHASONE 4 MG PO TABS
8.0000 mg | ORAL_TABLET | Freq: Every day | ORAL | Status: DC
  Administered 2016-05-05: 8 mg via ORAL
  Filled 2016-05-04 (×3): qty 2

## 2016-05-04 MED ORDER — PANTOPRAZOLE SODIUM 40 MG IV SOLR
INTRAVENOUS | Status: AC
  Filled 2016-05-04: qty 160

## 2016-05-04 MED ORDER — PANTOPRAZOLE SODIUM 40 MG IV SOLR
40.0000 mg | Freq: Once | INTRAVENOUS | Status: AC
  Administered 2016-05-04: 40 mg via INTRAVENOUS
  Filled 2016-05-04: qty 40

## 2016-05-04 MED ORDER — SODIUM CHLORIDE 0.9 % IV SOLN
10.0000 mL/h | Freq: Once | INTRAVENOUS | Status: AC
  Administered 2016-05-04: 10 mL/h via INTRAVENOUS

## 2016-05-04 MED ORDER — SEVELAMER CARBONATE 800 MG PO TABS
800.0000 mg | ORAL_TABLET | Freq: Three times a day (TID) | ORAL | Status: DC
  Administered 2016-05-05 – 2016-05-07 (×7): 800 mg via ORAL
  Filled 2016-05-04 (×7): qty 1

## 2016-05-04 MED ORDER — ATORVASTATIN CALCIUM 40 MG PO TABS
40.0000 mg | ORAL_TABLET | Freq: Every day | ORAL | Status: DC
  Administered 2016-05-04 – 2016-05-07 (×4): 40 mg via ORAL
  Filled 2016-05-04 (×4): qty 1

## 2016-05-04 MED ORDER — SODIUM CHLORIDE 0.9% FLUSH
3.0000 mL | Freq: Two times a day (BID) | INTRAVENOUS | Status: DC
  Administered 2016-05-04 – 2016-05-07 (×4): 3 mL via INTRAVENOUS

## 2016-05-04 MED ORDER — LISINOPRIL 10 MG PO TABS
40.0000 mg | ORAL_TABLET | Freq: Every day | ORAL | Status: DC
  Administered 2016-05-06 – 2016-05-07 (×2): 40 mg via ORAL
  Filled 2016-05-04 (×2): qty 4

## 2016-05-04 MED ORDER — SODIUM CHLORIDE 0.9 % IV SOLN
80.0000 mg | Freq: Once | INTRAVENOUS | Status: AC
  Administered 2016-05-04: 80 mg via INTRAVENOUS
  Filled 2016-05-04: qty 80

## 2016-05-04 MED ORDER — CALCIUM ACETATE (PHOS BINDER) 667 MG PO CAPS
667.0000 mg | ORAL_CAPSULE | Freq: Three times a day (TID) | ORAL | Status: DC
Start: 2016-05-05 — End: 2016-05-07
  Administered 2016-05-05 – 2016-05-07 (×7): 667 mg via ORAL
  Filled 2016-05-04 (×6): qty 1

## 2016-05-04 NOTE — ED Notes (Signed)
CRITICAL VALUE ALERT  Critical value received: HGB 6.5  Date of notification:  05/04/16  Time of notification:  1650  Critical value read back:Yes.    Nurse who received alert:  Laurell Josephs RN  MD notified (1st page):  Dr. Gilford Raid  Time of first page:  1650  MD notified (2nd page):  Time of second page:  Responding MD:  Gilford Raid  Time MD responded:  [id number]

## 2016-05-04 NOTE — ED Notes (Signed)
Pt's granddaughter   [phone]

## 2016-05-04 NOTE — ED Provider Notes (Signed)
[facility] DEPT Provider Note   CSN: [id number] Arrival date & time: 05/04/16  1537  First Provider Contact:  First MD Initiated Contact with Patient 05/04/16 1551        History   Chief Complaint Chief Complaint  Patient presents with  . Fatigue    HPI Nathaniel Johnston is a 60 y.o. male.  Pt is a dialysis patient and a colon cancer patient receiving chemo.  The pt presents today with weakness.  He was here in the ED 2 days ago for a fistula malfunction.  He had surgery yesterday to fix the fistula.  He did go to dialysis today, but was only able to finish 3 hrs out of a normally 4.5 hr dialysis day.  The pt also had chemo on August 4.  The pt said he feels very weak and tired.   The history is provided by the patient.    Past Medical History:  Diagnosis Date  . Adenocarcinoma of cecum ([place]) 03/08/2016  . Cancer ([place]) 07/2014   colon cancer surgery.  Finished Chemo 02/2015  . Chronic kidney disease   . Diabetes mellitus without complication ([place])   . DNR (do not resuscitate) 03/11/2016  . ESRD (end stage renal disease) ([place]) 03/11/2016  . GERD (gastroesophageal reflux disease)   . High cholesterol   . Hypertension   . Stroke [facility])    2003    Patient Active Problem List   Diagnosis Date Noted  . GI bleed 05/04/2016  . Dialysis AV fistula malfunction ([place])   . Diarrhea 04/01/2016  . DNR (do not resuscitate) 03/11/2016  . ESRD (end stage renal disease) ([place]) 03/11/2016  . Adenocarcinoma of cecum ([place]) 03/08/2016  . High cholesterol     Past Surgical History:  Procedure Laterality Date  . AMPUTATION TOE Left 2001  . CATARACT EXTRACTION W/PHACO Right 06/16/2015   Procedure: CATARACT EXTRACTION PHACO AND INTRAOCULAR LENS PLACEMENT RIGHT EYE CDE=54.69;  Surgeon: Williams Che, MD;  Location: [facility];  Service: Ophthalmology;  Laterality: Right;  . COLECTOMY  07/2014  . DIALYSIS FISTULA CREATION Right   . toe amputation Right 2000  . YAG LASER APPLICATION Right  [id number]   Procedure: YAG LASER APPLICATION;  Surgeon: Williams Che, MD;  Location: [facility] ORS;  Service: Ophthalmology;  Laterality: Right;       Home Medications    Prior to Admission medications   Medication Sig Start Date End Date Taking? Authorizing Provider  amLODipine (NORVASC) 10 MG tablet Take 2 tablets by mouth daily.  07/29/14  Yes Historical Provider, MD  calcium acetate (PHOSLO) 667 MG capsule Take 1 capsule by mouth 3 (three) times daily with meals.  07/15/14  Yes Historical Provider, MD  Cetuximab (ERBITUX IV) Inject into the vein every 14 (fourteen) days. Every 2 weeks    Yes Historical Provider, MD  dexamethasone (DECADRON) 4 MG tablet Take 2 tablets (8 mg total) by mouth daily. Start the day after chemo for 2 days. 04/27/16  Yes Patrici Ranks, MD  fluorouracil CALGB 60454 in sodium chloride 0.9 % 150 mL Inject into the vein over 24 hr.   Yes Historical Provider, MD  glipiZIDE (GLUCOTROL) 10 MG tablet Take 1 tablet by mouth 2 (two) times daily before a meal.  07/01/14  Yes Historical Provider, MD  HYDROcodone-acetaminophen (NORCO/VICODIN) 5-325 MG tablet Take 1 tablet by mouth every 6 (six) hours as needed for moderate pain.  02/21/16  Yes Historical Provider, MD  irinotecan in dextrose 5 %  500 mL Inject into the vein every 14 (fourteen) days. Every 2 weeks    Yes Historical Provider, MD  Iron Polysacch Cmplx-B12-FA 150-0.025-1 MG CAPS Take 1 capsule by mouth daily.  08/27/14  Yes Historical Provider, MD  labetalol (NORMODYNE) 300 MG tablet Take 300 mg by mouth 2 (two) times daily.  06/02/10  Yes Historical Provider, MD  LEUCOVORIN CALCIUM IJ Inject as directed every 14 (fourteen) days.    Yes Historical Provider, MD  lisinopril (PRINIVIL,ZESTRIL) 40 MG tablet Take 40 mg by mouth daily.  06/02/10  Yes Historical Provider, MD  ondansetron (ZOFRAN) 8 MG tablet Take 1 tablet (8 mg total) by mouth 2 (two) times daily as needed for refractory nausea / vomiting. Start on day 3 after  chemotherapy. 04/27/16  Yes Patrici Ranks, MD  potassium chloride SA (K-DUR,KLOR-CON) 20 MEQ tablet Take 1 tablet (20 mEq total) by mouth daily. 04/06/16  Yes Baird Cancer, PA-C  prochlorperazine (COMPAZINE) 10 MG tablet Take 1 tablet (10 mg total) by mouth every 6 (six) hours as needed (NAUSEA). 04/27/16  Yes Patrici Ranks, MD  sevelamer carbonate (RENVELA) 800 MG tablet Take 800 mg by mouth 3 (three) times daily with meals.  07/30/14  Yes Historical Provider, MD  simvastatin (ZOCOR) 80 MG tablet Take 1 tablet by mouth daily at 6 PM.  08/17/14  Yes Historical Provider, MD  Skin Protectants, Misc. (EUCERIN) cream Apply 1 application topically as needed for dry skin.  08/29/14  Yes Historical Provider, MD  triamcinolone cream (KENALOG) 0.1 % Apply 1 application topically daily as needed (for skin irritation).  06/05/14  Yes Historical Provider, MD  loperamide (IMODIUM) 2 MG capsule Take 2 capsules after the first loose stool and then 1 capsule every 2 hours until you go a total of 12 hours without having a loose stool. If it is bedtime and you are having loose stools take 2 capsules every 4 hours until morning. Call [place]. 04/01/16   Patrici Ranks, MD    Family History No family history on file.  Social History Social History  Substance Use Topics  . Smoking status: Former Smoker    Packs/day: 0.50    Years: 15.00    Types: Cigarettes    Quit date: 03/12/1991  . Smokeless tobacco: Never Used  . Alcohol use 3.0 oz/week    5 Shots of liquor per week     Allergies   Review of patient's allergies indicates no known allergies.   Review of Systems Review of Systems  Constitutional: Positive for fatigue.  Neurological: Positive for weakness.  All other systems reviewed and are negative.    Physical Exam Updated Vital Signs BP 105/57   Pulse 78   Temp 98.2 F (36.8 C) (Oral)   Resp 15   SpO2 100%   Physical Exam  Constitutional: He is oriented to person, place, and  time. He appears well-developed and well-nourished.  HENT:  Head: Normocephalic and atraumatic.  Right Ear: External ear normal.  Left Ear: External ear normal.  Nose: Nose normal.  Mouth/Throat: Oropharynx is clear and moist.  Eyes: Conjunctivae and EOM are normal. Pupils are equal, round, and reactive to light.  Neck: Normal range of motion. Neck supple.  Cardiovascular: Normal rate, regular rhythm and intact distal pulses.   Murmur heard. Pulmonary/Chest: Effort normal and breath sounds normal.  Abdominal: Soft. Bowel sounds are normal.  Genitourinary: Rectal exam shows guaiac positive stool.  Genitourinary Comments: Pt produced a stool which was  brown and it was strongly guaiac positive.  Internal exam not done due to immunocompromised status.  Musculoskeletal: Normal range of motion. He exhibits edema.  Fistula right upper arm.  Good thrill.  Used today at dialysis.  Neurological: He is alert and oriented to person, place, and time.  Skin: Skin is warm and dry.  Psychiatric: He has a normal mood and affect. His behavior is normal. Judgment and thought content normal.  Nursing note and vitals reviewed.    ED Treatments / Results  Labs (all labs ordered are listed, but only abnormal results are displayed) Labs Reviewed  COMPREHENSIVE METABOLIC PANEL - Abnormal; Notable for the following:       Result Value   Chloride 98 (*)    Glucose, Bld 145 (*)    BUN 48 (*)    Creatinine, Ser 5.75 (*)    Calcium 7.6 (*)    Total Protein 5.8 (*)    Albumin 3.1 (*)    ALT 15 (*)    GFR calc non Af Amer 10 (*)    GFR calc Af Amer 11 (*)    All other components within normal limits  CBC WITH DIFFERENTIAL/PLATELET - Abnormal; Notable for the following:    WBC 3.7 (*)    RBC 1.98 (*)    Hemoglobin 6.5 (*)    HCT 19.7 (*)    Lymphs Abs 0.5 (*)    All other components within normal limits  TROPONIN I - Abnormal; Notable for the following:    Troponin I 0.03 (*)    All other components  within normal limits  MAGNESIUM - Abnormal; Notable for the following:    Magnesium 2.5 (*)    All other components within normal limits  POC OCCULT BLOOD, ED - Abnormal; Notable for the following:    Fecal Occult Bld POSITIVE (*)    All other components within normal limits  TYPE AND SCREEN  PREPARE RBC (CROSSMATCH)  ABO/RH    EKG  EKG Interpretation  Date/Time:  Tuesday May 04 2016 15:44:28 EDT Ventricular Rate:  83 PR Interval:    QRS Duration: 109 QT Interval:  400 QTC Calculation: 470 R Axis:   2 Text Interpretation:  Sinus rhythm Borderline T wave abnormalities No STEMI.  Similar to prior tracing.  Confirmed by LONG MD, JOSHUA [phone]) on 05/04/2016 3:50:15 PM       Radiology Chest 2 View  Result Date: 05/03/2016 CLINICAL DATA:  Preop fistula declot EXAM: CHEST  2 VIEW COMPARISON:  03/24/2016 FINDINGS: There is a vascular stent identified along the course of the right subclavian vein. Left chest wall port a catheter is noted with tip in the projection of the cavoatrial junction. Normal heart size. Linear scar like densities noted in the left midlung and right base. IMPRESSION: 1. No acute cardiopulmonary abnormalities. Electronically Signed   By: Kerby Moors M.D.   On: 05/03/2016 09:30    Procedures Procedures (including critical care time)  Medications Ordered in ED Medications  0.9 %  sodium chloride infusion (not administered)  pantoprazole (PROTONIX) injection 40 mg (40 mg Intravenous Given 05/04/16 1735)     Initial Impression / Assessment and Plan / ED Course  I have reviewed the triage vital signs and the nursing notes.  Pertinent labs & imaging results that were available during my care of the patient were reviewed by me and considered in my medical decision making (see chart for details).  Clinical Course   Pt d/w Dr. Marthenia Rolling ([facility]) who  requested that I speak with GI.  I spoke with Dr. Oneida Alar who will see pt in consult.   Final Clinical  Impressions(s) / ED Diagnoses   Final diagnoses:  Symptomatic anemia  ESRD on hemodialysis ([place])  Gastrointestinal hemorrhage, unspecified gastritis, unspecified gastrointestinal hemorrhage type    New Prescriptions New Prescriptions   No medications on file     Isla Pence, MD 05/04/16 [phone]

## 2016-05-04 NOTE — ED Notes (Signed)
Pt assisted to bedside commode.  3rd episode of brown diarrhea.

## 2016-05-04 NOTE — H&P (Signed)
History and Physical  Nathaniel Johnston [id number] DOB: 08/25/1956 DOA: 05/04/2016  Referring physician: ER Physician PCP: No PCP Per Patient  Outpatient Specialists:    Patient coming from: Home  Chief Complaint: Weakness and dark colored stools  HPI: 60 year old African American Male with history significant for ESRD on HD TTS, CVA with left sided hemiparesis and colon cancer for which the patient is currently undergoing chemotherapy with reported post chemotherapy anemia. Patient presents with weakness, fatigue and dark colored stool. CBC done in ER revealed Hb of 6.5g/dl. No BRBPR. No headache, no chest pain, no Gastroenterology symptoms or urinary symptom. No headache, no neck pain, no chest pain. Patient tells me that he still makes urine. Patient was able to tolerate only 3 hours of 4.5 hours of dialysis planned for today.  ED Course: Currently being transfused.  Pertinent labs: Trop of 0.03 and Hb of 6.5g/dl  Review of Systems:  As in HPI. Negative for fever, visual changes, sore throat, rash, new muscle aches, chest pain, dysuria, bleeding, n/v/abdominal pain.  Past Medical History:  Diagnosis Date  . Adenocarcinoma of cecum ([place]) 03/08/2016  . Cancer ([place]) 07/2014   colon cancer surgery.  Finished Chemo 02/2015  . Chronic kidney disease   . Diabetes mellitus without complication ([place])   . DNR (do not resuscitate) 03/11/2016  . ESRD (end stage renal disease) ([place]) 03/11/2016  . GERD (gastroesophageal reflux disease)   . High cholesterol   . Hypertension   . Stroke [facility])    2003    Past Surgical History:  Procedure Laterality Date  . AMPUTATION TOE Left 2001  . CATARACT EXTRACTION W/PHACO Right 06/16/2015   Procedure: CATARACT EXTRACTION PHACO AND INTRAOCULAR LENS PLACEMENT RIGHT EYE CDE=54.69;  Surgeon: Williams Che, MD;  Location: [facility] ORS;  Service: Ophthalmology;  Laterality: Right;  . COLECTOMY  07/2014  . DIALYSIS FISTULA CREATION Right   . toe amputation Right 2000   . YAG LASER APPLICATION Right [id number]   Procedure: YAG LASER APPLICATION;  Surgeon: Williams Che, MD;  Location: [facility] ORS;  Service: Ophthalmology;  Laterality: Right;     reports that he quit smoking about 25 years ago. His smoking use included Cigarettes. He has a 7.50 pack-year smoking history. He has never used smokeless tobacco. He reports that he drinks about 3.0 oz of alcohol per week . He reports that he does not use drugs.  No Known Allergies  No family history on file.   Prior to Admission medications   Medication Sig Start Date End Date Taking? Authorizing Provider  amLODipine (NORVASC) 10 MG tablet Take 2 tablets by mouth daily.  07/29/14  Yes Historical Provider, MD  calcium acetate (PHOSLO) 667 MG capsule Take 1 capsule by mouth 3 (three) times daily with meals.  07/15/14  Yes Historical Provider, MD  Cetuximab (ERBITUX IV) Inject into the vein every 14 (fourteen) days. Every 2 weeks    Yes Historical Provider, MD  dexamethasone (DECADRON) 4 MG tablet Take 2 tablets (8 mg total) by mouth daily. Start the day after chemo for 2 days. 04/27/16  Yes Patrici Ranks, MD  fluorouracil CALGB 57846 in sodium chloride 0.9 % 150 mL Inject into the vein over 24 hr.   Yes Historical Provider, MD  glipiZIDE (GLUCOTROL) 10 MG tablet Take 1 tablet by mouth 2 (two) times daily before a meal.  07/01/14  Yes Historical Provider, MD  HYDROcodone-acetaminophen (NORCO/VICODIN) 5-325 MG tablet Take 1 tablet by mouth every 6 (six)  hours as needed for moderate pain.  02/21/16  Yes Historical Provider, MD  irinotecan in dextrose 5 % 500 mL Inject into the vein every 14 (fourteen) days. Every 2 weeks    Yes Historical Provider, MD  Iron Polysacch Cmplx-B12-FA 150-0.025-1 MG CAPS Take 1 capsule by mouth daily.  08/27/14  Yes Historical Provider, MD  labetalol (NORMODYNE) 300 MG tablet Take 300 mg by mouth 2 (two) times daily.  06/02/10  Yes Historical Provider, MD  LEUCOVORIN CALCIUM IJ Inject as directed  every 14 (fourteen) days.    Yes Historical Provider, MD  lisinopril (PRINIVIL,ZESTRIL) 40 MG tablet Take 40 mg by mouth daily.  06/02/10  Yes Historical Provider, MD  ondansetron (ZOFRAN) 8 MG tablet Take 1 tablet (8 mg total) by mouth 2 (two) times daily as needed for refractory nausea / vomiting. Start on day 3 after chemotherapy. 04/27/16  Yes Patrici Ranks, MD  potassium chloride SA (K-DUR,KLOR-CON) 20 MEQ tablet Take 1 tablet (20 mEq total) by mouth daily. 04/06/16  Yes Baird Cancer, PA-C  sevelamer carbonate (RENVELA) 800 MG tablet Take 800 mg by mouth 3 (three) times daily with meals.  07/30/14  Yes Historical Provider, MD  simvastatin (ZOCOR) 80 MG tablet Take 1 tablet by mouth daily at 6 PM.  08/17/14  Yes Historical Provider, MD  Skin Protectants, Misc. (EUCERIN) cream Apply 1 application topically as needed for dry skin.  08/29/14  Yes Historical Provider, MD  triamcinolone cream (KENALOG) 0.1 % Apply 1 application topically daily as needed (for skin irritation).  06/05/14  Yes Historical Provider, MD  loperamide (IMODIUM) 2 MG capsule Take 2 capsules after the first loose stool and then 1 capsule every 2 hours until you go a total of 12 hours without having a loose stool. If it is bedtime and you are having loose stools take 2 capsules every 4 hours until morning. Call [place]. 04/01/16   Patrici Ranks, MD    Physical Exam: Vitals:   05/04/16 1800 05/04/16 1830 05/04/16 1901 05/04/16 1915  BP: 105/57 (!) 123/52 (!) 111/53 107/58  Pulse: 78 78 79 80  Resp: 15 19 19 18   Temp:   98.2 F (36.8 C) 98.3 F (36.8 C)  TempSrc:   Oral Oral  SpO2: 100% 98% 98% 99%    Constitutional:  . Appears calm and comfortable. HD access RUE Eyes:   Pallor. No jaundice.  ENMT:  . external ears, nose appear normal Neck:  . Neck is supple. No JVD Respiratory:  . CTA bilaterally, no w/r/r.  . Respiratory effort normal. No retractions or accessory muscle use Cardiovascular:   . S1S2 . Chronic skin changes of the legs. Abdomen:  . Abdomen is soft and non tender. Organs are difficult to assess. Neurologic:  . Awake and alert. . Moves all limbs.  Wt Readings from Last 3 Encounters:  05/03/16 114.3 kg (252 lb)  05/01/16 114.3 kg (252 lb)  04/29/16 116.6 kg (257 lb)    I have personally reviewed following labs and imaging studies  Labs on Admission:  CBC:  Recent Labs Lab 04/29/16 1124 05/04/16 1616  WBC 5.2 3.7*  NEUTROABS 3.4 3.0  HGB 8.3* 6.5*  HCT 25.8* 19.7*  MCV 103.2* 99.5  PLT 242 [id number]   Basic Metabolic Panel:  Recent Labs Lab 04/29/16 1124 05/01/16 1432 05/03/16 0609 05/04/16 1616  NA 137 136 137 135  K 3.9 5.2* 4.6 3.8  CL 100* 96* 98* 98*  CO2 30 27 29  28  GLUCOSE 122* 187* 89 145*  BUN 21* 59* 53* 48*  CREATININE 4.58* 7.38* 6.28* 5.75*  CALCIUM 8.6* 9.3 7.9* 7.6*  MG 2.6* 3.5*  --  2.5*  PHOS  --  4.7*  --   --    Liver Function Tests:  Recent Labs Lab 04/29/16 1124 05/04/16 1616  AST 19 19  ALT 14* 15*  ALKPHOS 55 44  BILITOT 0.2* 0.3  PROT 7.1 5.8*  ALBUMIN 3.6 3.1*   No results for input(s): LIPASE, AMYLASE in the last 168 hours. No results for input(s): AMMONIA in the last 168 hours. Coagulation Profile: No results for input(s): INR, PROTIME in the last 168 hours. Cardiac Enzymes:  Recent Labs Lab 05/04/16 1616  TROPONINI 0.03*   BNP (last 3 results) No results for input(s): PROBNP in the last 8760 hours. HbA1C: No results for input(s): HGBA1C in the last 72 hours. CBG:  Recent Labs Lab 05/03/16 0900 05/03/16 1157 05/03/16 2036  GLUCAP 94 102* 194*   Lipid Profile: No results for input(s): CHOL, HDL, LDLCALC, TRIG, CHOLHDL, LDLDIRECT in the last 72 hours. Thyroid Function Tests: No results for input(s): TSH, T4TOTAL, FREET4, T3FREE, THYROIDAB in the last 72 hours. Anemia Panel: No results for input(s): VITAMINB12, FOLATE, FERRITIN, TIBC, IRON, RETICCTPCT in the last 72 hours. Urine  analysis:    Component Value Date/Time   COLORURINE RED (A) 04/15/2016 1520   APPEARANCEUR HAZY (A) 04/15/2016 1520   LABSPEC 1.010 04/15/2016 1520   PHURINE 8.5 (H) 04/15/2016 1520   GLUCOSEU 500 (A) 04/15/2016 1520   HGBUR LARGE (A) 04/15/2016 1520   BILIRUBINUR NEGATIVE 04/15/2016 1520   KETONESUR NEGATIVE 04/15/2016 1520   PROTEINUR >300 (A) 04/15/2016 1520   NITRITE NEGATIVE 04/15/2016 1520   LEUKOCYTESUR TRACE (A) 04/15/2016 1520   Sepsis Labs: @LABRCNTIP (procalcitonin:4,lacticidven:4) ) Recent Results (from the past 240 hour(s))  Surgical pcr screen     Status: None   Collection Time: 05/03/16  7:33 AM  Result Value Ref Range Status   MRSA, PCR NEGATIVE NEGATIVE Final   Staphylococcus aureus NEGATIVE NEGATIVE Final    Comment:        The Xpert SA Assay (FDA approved for NASAL specimens in patients over 61 years of age), is one component of a comprehensive surveillance program.  Test performance has been validated by [facility] for patients greater than or equal to 89 year old. It is not intended to diagnose infection nor to guide or monitor treatment.       Radiological Exams on Admission: Chest 2 View  Result Date: 05/03/2016 CLINICAL DATA:  Preop fistula declot EXAM: CHEST  2 VIEW COMPARISON:  03/24/2016 FINDINGS: There is a vascular stent identified along the course of the right subclavian vein. Left chest wall port a catheter is noted with tip in the projection of the cavoatrial junction. Normal heart size. Linear scar like densities noted in the left midlung and right base. IMPRESSION: 1. No acute cardiopulmonary abnormalities. Electronically Signed   By: Kerby Moors M.D.   On: 05/03/2016 09:30   Active Problems:   GI bleed   Assessment/Plan 1. GI Bleed, suspect upper 2. Symptomatic anemia, multifactorial - Blood loss, renal disease and colon cancer 3. Colon cancer on chemotherapy 4. ESRD on HD   Admit patient  Consult GI and Nephrology in  am  May need Further PRBC transfusion with HD in am  Monitor H/H  Transfuse 1 unit of PRBC today  Monitor blood sugar  IV protonix drip  Further management will depend on hospital course  DVT prophylaxis: SCD Code Status: Full Family Communication:  Disposition Plan: Home eventually   Consults called: Call GI and Nephrology in am   Admission status: Inpatient    Time spent: Greater than 60 minutes  Dana Allan, MD  [facility] Pager #: [phone] 7PM-7AM contact night coverage as above   05/04/2016, 7:24 PM

## 2016-05-04 NOTE — ED Triage Notes (Signed)
Pt states his fistula malfunctioned and he had to have surgery yesterday to have it fixed. States he has been feeling tired every since. States he went to dialysis today but did not stay the usual 4 hrs because he felt so bad

## 2016-05-04 NOTE — ED Notes (Cosign Needed)
CRITICAL VALUE ALERT  Critical value received:  Troponin 0.03  Date of notification:  05/04/16  Time of notification:  [id number]  Critical value read back:Yes.    Nurse who received alert:  Laurell Josephs RN  MD notified (1st page):  Gilford Raid  Time of first page:  1730  MD notified (2nd page):  Time of second page:  Responding MD:  Gilford Raid  Time MD responded:  [id number]

## 2016-05-04 NOTE — ED Notes (Signed)
Lab did not notify blood ready.  Noted Type and screen resulted.  Will call to verify.

## 2016-05-05 DIAGNOSIS — Z66 Do not resuscitate: Secondary | ICD-10-CM

## 2016-05-05 DIAGNOSIS — K921 Melena: Secondary | ICD-10-CM | POA: Diagnosis not present

## 2016-05-05 DIAGNOSIS — C180 Malignant neoplasm of cecum: Secondary | ICD-10-CM | POA: Diagnosis not present

## 2016-05-05 DIAGNOSIS — K922 Gastrointestinal hemorrhage, unspecified: Secondary | ICD-10-CM | POA: Diagnosis not present

## 2016-05-05 DIAGNOSIS — N186 End stage renal disease: Secondary | ICD-10-CM | POA: Diagnosis not present

## 2016-05-05 DIAGNOSIS — D62 Acute posthemorrhagic anemia: Secondary | ICD-10-CM

## 2016-05-05 DIAGNOSIS — K2901 Acute gastritis with bleeding: Secondary | ICD-10-CM | POA: Diagnosis not present

## 2016-05-05 LAB — HEMOGLOBIN
Hemoglobin: 6.9 g/dL — CL (ref 13.0–17.0)
Hemoglobin: 7.5 g/dL — ABNORMAL LOW (ref 13.0–17.0)

## 2016-05-05 LAB — COMPREHENSIVE METABOLIC PANEL
ALT: 16 U/L — ABNORMAL LOW (ref 17–63)
AST: 20 U/L (ref 15–41)
Albumin: 3.3 g/dL — ABNORMAL LOW (ref 3.5–5.0)
Alkaline Phosphatase: 45 U/L (ref 38–126)
Anion gap: 8 (ref 5–15)
BUN: 52 mg/dL — ABNORMAL HIGH (ref 6–20)
CO2: 27 mmol/L (ref 22–32)
Calcium: 7.7 mg/dL — ABNORMAL LOW (ref 8.9–10.3)
Chloride: 101 mmol/L (ref 101–111)
Creatinine, Ser: 6.52 mg/dL — ABNORMAL HIGH (ref 0.61–1.24)
GFR calc Af Amer: 10 mL/min — ABNORMAL LOW (ref >60)
GFR calc non Af Amer: 8 mL/min — ABNORMAL LOW (ref >60)
Glucose, Bld: 155 mg/dL — ABNORMAL HIGH (ref 65–99)
Potassium: 4.3 mmol/L (ref 3.5–5.1)
Sodium: 136 mmol/L (ref 135–145)
Total Bilirubin: 0.4 mg/dL (ref 0.3–1.2)
Total Protein: 6.1 g/dL — ABNORMAL LOW (ref 6.5–8.1)

## 2016-05-05 LAB — CBC
HCT: 23.8 % — ABNORMAL LOW (ref 39.0–52.0)
Hemoglobin: 7.9 g/dL — ABNORMAL LOW (ref 13.0–17.0)
MCH: 32.5 pg (ref 26.0–34.0)
MCHC: 33.2 g/dL (ref 30.0–36.0)
MCV: 97.9 fL (ref 78.0–100.0)
Platelets: 205 10*3/uL (ref 150–400)
RBC: 2.43 MIL/uL — ABNORMAL LOW (ref 4.22–5.81)
RDW: 16.6 % — ABNORMAL HIGH (ref 11.5–15.5)
WBC: 4.1 10*3/uL (ref 4.0–10.5)

## 2016-05-05 LAB — HEMATOCRIT
HCT: 20.7 % — ABNORMAL LOW (ref 39.0–52.0)
HCT: 22.7 % — ABNORMAL LOW (ref 39.0–52.0)

## 2016-05-05 LAB — GLUCOSE, RANDOM
Glucose, Bld: 150 mg/dL — ABNORMAL HIGH (ref 65–99)
Glucose, Bld: 146 mg/dL — ABNORMAL HIGH (ref 65–99)
Glucose, Bld: 127 mg/dL — ABNORMAL HIGH (ref 65–99)
Glucose, Bld: 258 mg/dL — ABNORMAL HIGH (ref 65–99)
Glucose, Bld: 228 mg/dL — ABNORMAL HIGH (ref 65–99)

## 2016-05-05 LAB — PHOSPHORUS: Phosphorus: 4 mg/dL (ref 2.5–4.6)

## 2016-05-05 LAB — MAGNESIUM: Magnesium: 2.4 mg/dL (ref 1.7–2.4)

## 2016-05-05 MED ORDER — SALINE SPRAY 0.65 % NA SOLN
1.0000 | NASAL | Status: DC | PRN
  Administered 2016-05-05: 1 via NASAL
  Filled 2016-05-05: qty 44

## 2016-05-05 NOTE — Care Management Important Message (Signed)
Important Message  Patient Details  Name: LESS CHOU MRN: [id number] Date of Birth: [birth year]   Medicare Important Message Given:  Yes    Mako Pelfrey, Chauncey Reading, RN 05/05/2016, 2:00 PM

## 2016-05-05 NOTE — Progress Notes (Signed)
Pt with nose bleed. Not on blood thinners. Ice and pressure applied. Bleeding stopped but pt then blew his nose and it started bleeding again. Pressure reapplied and nose bleed has currently stopped. Saline nasal spray ordered prn.

## 2016-05-05 NOTE — Consult Note (Signed)
Reason for Consult: melena  Referring Physician:   ARMARI FUSSELL is an 60 y.o. male.  HPI: Admitted thru the ED yesterday  Guaiac positive stool and anemia. Stool was brown in the ED.  He says he was weak.  Hemoglobin down to 6.5. He has received one unit of blood and will receive another after dialysis. Hemoglobin today 7.9 Takes T-Thur-Sat.. Has been on dialysis for 7 yrs.  Hx of colon cancer receives IV chemo. Diagnosed 3 yrs ago with colon cancer. Patient denies prior hx melena.  Hx of CVA in 2004.  He denies any acid reflux. Appetite is good.  No abdominal pain.  Fistula rt upper arm. Port a cath left upper chest.  Denies NSAIDS.  Lives by himself in [place]. He is disabled.    07/21/2015 colonoscopy: hx of colon cancer, surveillance;  No complications. diverticula in the sigmoid colon. Retroflexed revealed no abnormalities.  07/19/2014: Colonoscopy: Dr Britta Mccreedy.  Hematochezia.  Multiple polyps were found thru out the entire examined colon. There was a minimum of 16 polyps removed, most were less than 1 cm. At least 3 were greater than 1cm. They were located in the transverse colon, descending and sigmoid colon. All removed with a snare. . Circumferential mass was found at the hepatic flexure, biopsy was performed using cold forceps, a tattoo was applied. Moderate diverticulosis was noted thru out the entire examined colon.  Retroflexed revealed no abnormalities.  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 Nineteen of 25 total lymph nodes positive for metastatic carcinoma.  Extensive lymphovascular invasion.  Tubular adenomas, six, showing at least lymphovascular invasion, with variable degrees of epithelial involvement and numbers of mucinous carcinoma tumor nodules.  Ileum: Prominent .ymphovascular invasion by adenocarcinoma with involvement of glandular epithelium. Appendix: Appendix, no significant abnormality identified.  Past Medical  History:  Diagnosis Date  . 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 Cancer ([place]) 07/2014   colon cancer surgery.  Finished Chemo 02/2015  . Chronic kidney disease   . Diabetes mellitus without complication ([place])   . DNR (do not resuscitate) 03/11/2016  . ESRD (end stage renal disease) ([place]) 03/11/2016  . GERD (gastroesophageal reflux disease)   . High cholesterol   . Hypertension   . Stroke [facility])    2003    Past Surgical History:  Procedure Laterality Date  . AMPUTATION TOE Left 2001  . CATARACT EXTRACTION W/PHACO Right 06/16/2015   Procedure: CATARACT EXTRACTION PHACO AND INTRAOCULAR LENS PLACEMENT RIGHT EYE CDE=54.69;  Surgeon: Williams Che, MD;  Location: [facility];  Service: Ophthalmology;  Laterality: Right;  . COLECTOMY  07/2014  . DIALYSIS FISTULA CREATION Right   . toe amputation Right 2000  . YAG LASER APPLICATION Right 47/65/4650   Procedure: YAG LASER APPLICATION;  Surgeon: Williams Che, MD;  Location: [facility];  Service: Ophthalmology;  Laterality: Right;    History reviewed. No pertinent family history.  Social History:  reports that he quit smoking about 25 years ago. His smoking use included Cigarettes. He has a 7.50 pack-year smoking history. He has never used smokeless tobacco. He reports that he drinks about 3.0 oz of alcohol per week . He reports that he does not use drugs.  Allergies: No Known Allergies  Medications: I have reviewed the patient's current medications.  Results for orders placed or performed during the hospital encounter of 05/04/16 (from the past 48 hour(s))  Comprehensive metabolic panel     Status: Abnormal  Collection Time: 05/04/16  4:16 PM  Result Value Ref Range   Sodium 135 135 - 145 mmol/L   Potassium 3.8 3.5 - 5.1 mmol/L    Comment: DELTA CHECK NOTED   Chloride 98 (L) 101 - 111 mmol/L   CO2 28 22 - 32 mmol/L   Glucose, Bld 145 (H) 65 - 99 mg/dL   BUN 48 (H) 6 - 20 mg/dL   Creatinine, Ser 5.75 (H) 0.61 - 1.24 mg/dL    Calcium 7.6 (L) 8.9 - 10.3 mg/dL   Total Protein 5.8 (L) 6.5 - 8.1 g/dL   Albumin 3.1 (L) 3.5 - 5.0 g/dL   AST 19 15 - 41 U/L   ALT 15 (L) 17 - 63 U/L   Alkaline Phosphatase 44 38 - 126 U/L   Total Bilirubin 0.3 0.3 - 1.2 mg/dL   GFR calc non Af Amer 10 (L) >60 mL/min   GFR calc Af Amer 11 (L) >60 mL/min    Comment: (NOTE) The eGFR has been calculated using the CKD EPI equation. This calculation has not been validated in all clinical situations. eGFR's persistently <60 mL/min signify possible Chronic Kidney Disease.    Anion gap 9 5 - 15  CBC with Differential     Status: Abnormal   Collection Time: 05/04/16  4:16 PM  Result Value Ref Range   WBC 3.7 (L) 4.0 - 10.5 K/uL   RBC 1.98 (L) 4.22 - 5.81 MIL/uL   Hemoglobin 6.5 (LL) 13.0 - 17.0 g/dL    Comment: CRITICAL RESULT CALLED TO, READ BACK BY AND VERIFIED WITH: CREWS,M AT 1650 ON 05/04/2016 BY ISLEY,B    HCT 19.7 (L) 39.0 - 52.0 %   MCV 99.5 78.0 - 100.0 fL   MCH 32.8 26.0 - 34.0 pg   MCHC 33.0 30.0 - 36.0 g/dL   RDW 15.5 11.5 - 15.5 %   Platelets 159 150 - 400 K/uL   Neutrophils Relative % 80 %   Neutro Abs 3.0 1.7 - 7.7 K/uL   Lymphocytes Relative 14 %   Lymphs Abs 0.5 (L) 0.7 - 4.0 K/uL   Monocytes Relative 2 %   Monocytes Absolute 0.1 0.1 - 1.0 K/uL   Eosinophils Relative 4 %   Eosinophils Absolute 0.1 0.0 - 0.7 K/uL   Basophils Relative 0 %   Basophils Absolute 0.0 0.0 - 0.1 K/uL  Troponin I     Status: Abnormal   Collection Time: 05/04/16  4:16 PM  Result Value Ref Range   Troponin I 0.03 (HH) <0.03 ng/mL    Comment: CRITICAL RESULT CALLED TO, READ BACK BY AND VERIFIED WITH: CREWS,M ON 05/04/16 AT 1725 BY LOY,C   Magnesium     Status: Abnormal   Collection Time: 05/04/16  4:16 PM  Result Value Ref Range   Magnesium 2.5 (H) 1.7 - 2.4 mg/dL  Type and screen [facility]     Status: None (Preliminary result)   Collection Time: 05/04/16  5:07 PM  Result Value Ref Range   ABO/RH(D) A POS    Antibody Screen  NEG    Sample Expiration 05/07/2016    Unit Number [id number]    Blood Component Type RED CELLS,LR    Unit division 00    Status of Unit ISSUED,FINAL    Transfusion Status OK TO TRANSFUSE    Crossmatch Result Compatible    Unit Number [id number]    Blood Component Type RED CELLS,LR    Unit division 00    Status of  Unit ALLOCATED    Transfusion Status OK TO TRANSFUSE    Crossmatch Result Compatible   Prepare RBC     Status: None   Collection Time: 05/04/16  5:07 PM  Result Value Ref Range   Order Confirmation ORDER PROCESSED BY BLOOD BANK   ABO/Rh     Status: None   Collection Time: 05/04/16  5:07 PM  Result Value Ref Range   ABO/RH(D) A POS   POC occult blood, ED Provider will collect     Status: Abnormal   Collection Time: 05/04/16  5:43 PM  Result Value Ref Range   Fecal Occult Bld POSITIVE (A) NEGATIVE  Glucose, serum     Status: Abnormal   Collection Time: 05/04/16 11:00 PM  Result Value Ref Range   Glucose, Bld 150 (H) 65 - 99 mg/dL  Magnesium     Status: None   Collection Time: 05/04/16 11:00 PM  Result Value Ref Range   Magnesium 2.4 1.7 - 2.4 mg/dL  Phosphorus     Status: None   Collection Time: 05/04/16 11:00 PM  Result Value Ref Range   Phosphorus 4.0 2.5 - 4.6 mg/dL  Hematocrit     Status: Abnormal   Collection Time: 05/04/16 11:00 PM  Result Value Ref Range   HCT 20.7 (L) 39.0 - 52.0 %  Hemoglobin     Status: Abnormal   Collection Time: 05/04/16 11:00 PM  Result Value Ref Range   Hemoglobin 6.9 (LL) 13.0 - 17.0 g/dL    Comment: CRITICAL RESULT CALLED TO, READ BACK BY AND VERIFIED WITH: BELIZAIRE W AT 0045 ON [id number] BY FORSYTH K   Comprehensive metabolic panel     Status: Abnormal   Collection Time: 05/05/16  6:15 AM  Result Value Ref Range   Sodium 136 135 - 145 mmol/L   Potassium 4.3 3.5 - 5.1 mmol/L   Chloride 101 101 - 111 mmol/L   CO2 27 22 - 32 mmol/L   Glucose, Bld 155 (H) 65 - 99 mg/dL   BUN 52 (H) 6 - 20 mg/dL   Creatinine, Ser 6.52  (H) 0.61 - 1.24 mg/dL   Calcium 7.7 (L) 8.9 - 10.3 mg/dL   Total Protein 6.1 (L) 6.5 - 8.1 g/dL   Albumin 3.3 (L) 3.5 - 5.0 g/dL   AST 20 15 - 41 U/L   ALT 16 (L) 17 - 63 U/L   Alkaline Phosphatase 45 38 - 126 U/L   Total Bilirubin 0.4 0.3 - 1.2 mg/dL   GFR calc non Af Amer 8 (L) >60 mL/min   GFR calc Af Amer 10 (L) >60 mL/min    Comment: (NOTE) The eGFR has been calculated using the CKD EPI equation. This calculation has not been validated in all clinical situations. eGFR's persistently <60 mL/min signify possible Chronic Kidney Disease.    Anion gap 8 5 - 15  CBC     Status: Abnormal   Collection Time: 05/05/16  6:15 AM  Result Value Ref Range   WBC 4.1 4.0 - 10.5 K/uL   RBC 2.43 (L) 4.22 - 5.81 MIL/uL   Hemoglobin 7.9 (L) 13.0 - 17.0 g/dL   HCT 23.8 (L) 39.0 - 52.0 %   MCV 97.9 78.0 - 100.0 fL   MCH 32.5 26.0 - 34.0 pg   MCHC 33.2 30.0 - 36.0 g/dL   RDW 16.6 (H) 11.5 - 15.5 %   Platelets 205 150 - 400 K/uL  Glucose, serum     Status: Abnormal  Collection Time: 05/05/16  8:33 AM  Result Value Ref Range   Glucose, Bld 146 (H) 65 - 99 mg/dL    No results found.  ROS Blood pressure (!) 121/55, pulse 73, temperature 98.2 F (36.8 C), temperature source Oral, resp. rate 18, height 6' 0.1" (1.831 m), weight 255 lb 12.8 oz (116 kg), SpO2 100 %. Physical Exam Alert and oriented. Skin warm and dry. Oral mucosa is moist.   . Sclera anicteric, conjunctivae is pink. Thyroid not enlarged. No cervical lymphadenopathy. Lungs clear. Heart regular rate and rhythm.  Abdomen is soft. Bowel sounds are positive. No hepatomegaly. No abdominal masses felt. No tenderness.  No edema to lower extremities.     Assessment/Plan: Melena/ Anemia. PUD needs to be ruled out.   SETZER,TERRI W 05/05/2016, 11:36 AM   GI attending note: Patient interviewed and examined. Records reviewed including 2 prior colonoscopy records as well as abdominopelvic and just CT from 03/24/2016. Patient is  60 year old African-American male with history of end-stage renal disease on hemodialysis, prior history of CVA with left hemiparesis who underwent right hemicolectomy in October 2015 for mucinous adenocarcinoma hepatic flexure. He had multiple adenomas removed on his colonoscopy. He had 19 out of 25 lymph nodes positive for metastatic disease and received 24 cycles of chemotherapy. There was evidence of recurrent disease and he was begun on folfox and switch to folds and switched to folfiri and erbitux last week. Patient came to emergency room for progressive weakness which started about 2 weeks ago. He states he felt so weak that he could not even stand up. He did not experience nausea vomiting abdominal pain rectal bleeding. He states his stools been dark. In emergency room was noted to have hemoglobin of 6.5 g. He has received one unit of PRBCs and hemoglobin is 7.5. He is to receive another unit of PRBCs at the time of dialysis. He does complain of frequent heartburn and uses OTC medications. There is no history of peptic ulcer disease.  Assessment:  Acute on chronic anemia secondary to GI blood loss. Last colonoscopy was in October 2016. History of dark stool or melena of her GI tract disease to be examined. Patient with multiple medical problems including metastatic colon carcinoma, end-stage renal disease on dialysis diabetes mellitus and history of CVA. Patient is on pantoprazole infusion.  Recommendations:  Hold by mouth iron preparation for now. Diagnostic EGD in a.m.

## 2016-05-05 NOTE — Care Management Note (Signed)
Case Management Note  Patient Details  Name: Nathaniel Johnston MRN: [id number] Date of Birth: 05-17-1956  Subjective/Objective:  Patient is from home. Walks with a cane. He is on HD, TTHS schedule at [facility] in [place]. He sees a PCP at the [facility] clinic.                   Action/Plan: Anticipate DC with self care.    Expected Discharge Date:                  Expected Discharge Plan:  Home/Self Care  In-House Referral:  NA  Discharge planning Services  CM Consult  Post Acute Care Choice:  NA Choice offered to:  NA  DME Arranged:    DME Agency:     HH Arranged:    HH Agency:     Status of Service:  Completed, signed off  If discussed at [employer] of Stay Meetings, dates discussed:    Additional Comments:  Delorise Hunkele, Chauncey Reading, RN 05/05/2016, 1:58 PM

## 2016-05-05 NOTE — Progress Notes (Signed)
PROGRESS NOTE    Nathaniel Johnston  [id number] DOB: 23-Dec-1955 DOA: 05/04/2016 PCP: No PCP Per Patient     Brief Narrative:  60 year old man admitted on 8/8 from home with complaints of melena and generalized weakness. Found to have a hemoglobin of 6.5. Has end-stage renal disease.   Assessment & Plan:   Active Problems:   High cholesterol   Adenocarcinoma of cecum ([facility])   DNR (do not resuscitate)   ESRD (end stage renal disease) ([place])   GI bleed   Acute blood loss anemia   Melena/Acute Blood Loss Anemia -Has a history of adenocarcinoma of the cecum. -Seen by GI with plans for initially an endoscopy in a.m. -Continue PPI. -Has received 1 unit of PRBCs for hemoglobin of 6.5 with resultant hemoglobin of 7.5. -Has had no further melena today.  Colon cancer -Receiving active chemotherapy, will follow up with oncology in the outpatient setting.  End-stage renal disease -On hemodialysis Tuesday, Thursday, Saturday. -Nephrology consultation has been requested.   DVT prophylaxis: SCDs Code Status: DO NOT RESUSCITATE Family Communication: Daughter at bedside updated on plan of care and questions answered Disposition Plan: For EGD in a.m.  Consultants:   GI  Procedures:   None  Antimicrobials:   None    Subjective: Feels better, no further melena, no abdominal pain  Objective: Vitals:   05/04/16 2000 05/04/16 2104 05/05/16 0500 05/05/16 1300  BP: 116/64 117/63 (!) 121/55 (!) 143/53  Pulse:   73 91  Resp: 17  18 18   Temp:  98.3 F (36.8 C) 98.2 F (36.8 C) 97.8 F (36.6 C)  TempSrc:  Oral Oral Other (Comment)  SpO2:  100% 100% 99%  Weight:  116 kg (255 lb 12.8 oz)    Height:  6' 0.1" (1.831 m)      Intake/Output Summary (Last 24 hours) at 05/05/16 1643 Last data filed at 05/05/16 1200  Gross per 24 hour  Intake               30 ml  Output              950 ml  Net             -920 ml   Filed Weights   05/04/16 2104  Weight: 116 kg (255 lb 12.8 oz)     Examination:  General exam: Alert, awake, oriented x 3 Respiratory system: Clear to auscultation. Respiratory effort normal. Cardiovascular system:RRR. No murmurs, rubs, gallops. Gastrointestinal system: Abdomen is nondistended, soft and nontender. No organomegaly or masses felt. Normal bowel sounds heard. Central nervous system: Alert and oriented. No focal neurological deficits. Extremities: No C/C/E, +pedal pulses Skin: No rashes, lesions or ulcers Psychiatry: Judgement and insight appear normal. Mood & affect appropriate.     Data Reviewed: I have personally reviewed following labs and imaging studies  CBC:  Recent Labs Lab 04/29/16 1124 05/04/16 1616 05/04/16 2300 05/05/16 0615 05/05/16 1313  WBC 5.2 3.7*  --  4.1  --   NEUTROABS 3.4 3.0  --   --   --   HGB 8.3* 6.5* 6.9* 7.9* 7.5*  HCT 25.8* 19.7* 20.7* 23.8* 22.7*  MCV 103.2* 99.5  --  97.9  --   PLT 242 159  --  205  --    Basic Metabolic Panel:  Recent Labs Lab 04/29/16 1124 05/01/16 1432 05/03/16 0609 05/04/16 1616 05/04/16 2300 05/05/16 0615 05/05/16 0833 05/05/16 1313  NA 137 136 137 135  --  136  --   --  K 3.9 5.2* 4.6 3.8  --  4.3  --   --   CL 100* 96* 98* 98*  --  101  --   --   CO2 30 27 29 28   --  27  --   --   GLUCOSE 122* 187* 89 145* 150* 155* 146* 127*  BUN 21* 59* 53* 48*  --  52*  --   --   CREATININE 4.58* 7.38* 6.28* 5.75*  --  6.52*  --   --   CALCIUM 8.6* 9.3 7.9* 7.6*  --  7.7*  --   --   MG 2.6* 3.5*  --  2.5* 2.4  --   --   --   PHOS  --  4.7*  --   --  4.0  --   --   --    GFR: Estimated Creatinine Clearance: 15.7 mL/min (by C-G formula based on SCr of 6.52 mg/dL). Liver Function Tests:  Recent Labs Lab 04/29/16 1124 05/04/16 1616 05/05/16 0615  AST 19 19 20   ALT 14* 15* 16*  ALKPHOS 55 44 45  BILITOT 0.2* 0.3 0.4  PROT 7.1 5.8* 6.1*  ALBUMIN 3.6 3.1* 3.3*   No results for input(s): LIPASE, AMYLASE in the last 168 hours. No results for input(s): AMMONIA in  the last 168 hours. Coagulation Profile: No results for input(s): INR, PROTIME in the last 168 hours. Cardiac Enzymes:  Recent Labs Lab 05/04/16 1616  TROPONINI 0.03*   BNP (last 3 results) No results for input(s): PROBNP in the last 8760 hours. HbA1C: No results for input(s): HGBA1C in the last 72 hours. CBG:  Recent Labs Lab 05/03/16 0900 05/03/16 1157 05/03/16 2036  GLUCAP 94 102* 194*   Lipid Profile: No results for input(s): CHOL, HDL, LDLCALC, TRIG, CHOLHDL, LDLDIRECT in the last 72 hours. Thyroid Function Tests: No results for input(s): TSH, T4TOTAL, FREET4, T3FREE, THYROIDAB in the last 72 hours. Anemia Panel: No results for input(s): VITAMINB12, FOLATE, FERRITIN, TIBC, IRON, RETICCTPCT in the last 72 hours. Urine analysis:    Component Value Date/Time   COLORURINE RED (A) 04/15/2016 1520   APPEARANCEUR HAZY (A) 04/15/2016 1520   LABSPEC 1.010 04/15/2016 1520   PHURINE 8.5 (H) 04/15/2016 1520   GLUCOSEU 500 (A) 04/15/2016 1520   HGBUR LARGE (A) 04/15/2016 1520   BILIRUBINUR NEGATIVE 04/15/2016 1520   KETONESUR NEGATIVE 04/15/2016 1520   PROTEINUR >300 (A) 04/15/2016 1520   NITRITE NEGATIVE 04/15/2016 1520   LEUKOCYTESUR TRACE (A) 04/15/2016 1520   Sepsis Labs: @LABRCNTIP (procalcitonin:4,lacticidven:4)  ) Recent Results (from the past 240 hour(s))  Surgical pcr screen     Status: None   Collection Time: 05/03/16  7:33 AM  Result Value Ref Range Status   MRSA, PCR NEGATIVE NEGATIVE Final   Staphylococcus aureus NEGATIVE NEGATIVE Final    Comment:        The Xpert SA Assay (FDA approved for NASAL specimens in patients over 57 years of age), is one component of a comprehensive surveillance program.  Test performance has been validated by [facility] for patients greater than or equal to 1 year old. It is not intended to diagnose infection nor to guide or monitor treatment.          Radiology Studies: No results found.      Scheduled  Meds: . amLODipine  20 mg Oral Daily  . atorvastatin  40 mg Oral q1800  . calcium acetate  667 mg Oral TID WC  . iron polysaccharides  150 mg Oral Daily  . labetalol  300 mg Oral BID  . lisinopril  40 mg Oral Daily  . [START ON 05/08/2016] pantoprazole  40 mg Intravenous Q12H  . sevelamer carbonate  800 mg Oral TID WC  . sodium chloride flush  3 mL Intravenous Q12H   Continuous Infusions: . pantoprozole (PROTONIX) infusion 8 mg/hr (05/04/16 2100)     LOS: 1 day    Time spent: 25 minutes. Greater than 50% of this time was spent in direct contact with the patient coordinating care.     Lelon Frohlich, MD [facility] Pager [phone]  If 7PM-7AM, please contact night-coverage [email or web address] Password [facility] 05/05/2016, 4:43 PM

## 2016-05-05 NOTE — Consult Note (Signed)
Nathaniel Johnston MRN: [id number] DOB/AGE: [birth year] 60 y.o. Primary Care Physician:No PCP Per Patient Admit date: 05/04/2016 Chief Complaint:  Chief Complaint  Patient presents with  . Fatigue   HPI: Pt is  60 year old African American Male with past medica history for ESRD,colon cancer who came to ER with weakness, fatigue and dark colored stool.   HPI dates back to past few days when pt started feeling weak.  Upon evaluation in ER pp had Hb of 6.5g/dl. Pt was admitted for further tx Pt did receive PRBC Pt seen today on 3rd floor Pt says " I feel so much better"  No BRBPR.  No headache,  NO c/o fevr/cough/chillls NO c/o syncope   Past Medical History:  Diagnosis Date  . Adenocarcinoma of cecum ([place]) 03/08/2016  . Cancer ([place]) 07/2014   colon cancer surgery.  Finished Chemo 02/2015  . Chronic kidney disease   . Diabetes mellitus without complication ([place])   . DNR (do not resuscitate) 03/11/2016  . ESRD (end stage renal disease) ([place]) 03/11/2016  . GERD (gastroesophageal reflux disease)   . High cholesterol   . Hypertension   . Stroke [facility])    2003        History reviewed. No pertinent family history.  Social History:  reports that he quit smoking about 25 years ago. His smoking use included Cigarettes. He has a 7.50 pack-year smoking history. He has never used smokeless tobacco. He reports that he drinks about 3.0 oz of alcohol per week . He reports that he does not use drugs.   Allergies: No Known Allergies  Medications Prior to Admission  Medication Sig Dispense Refill  . amLODipine (NORVASC) 10 MG tablet Take 2 tablets by mouth daily.     . calcium acetate (PHOSLO) 667 MG capsule Take 1 capsule by mouth 3 (three) times daily with meals.     . Cetuximab (ERBITUX IV) Inject into the vein every 14 (fourteen) days. Every 2 weeks     . dexamethasone (DECADRON) 4 MG tablet Take 2 tablets (8 mg total) by mouth daily. Start the day after chemo for 2 days. 8 tablet 5  .  fluorouracil CALGB 91478 in sodium chloride 0.9 % 150 mL Inject into the vein over 24 hr.    . glipiZIDE (GLUCOTROL) 10 MG tablet Take 1 tablet by mouth 2 (two) times daily before a meal.     . HYDROcodone-acetaminophen (NORCO/VICODIN) 5-325 MG tablet Take 1 tablet by mouth every 6 (six) hours as needed for moderate pain.     . irinotecan in dextrose 5 % 500 mL Inject into the vein every 14 (fourteen) days. Every 2 weeks     . Iron Polysacch Cmplx-B12-FA 150-0.025-1 MG CAPS Take 1 capsule by mouth daily.     Marland Kitchen labetalol (NORMODYNE) 300 MG tablet Take 300 mg by mouth 2 (two) times daily.     Marland Kitchen LEUCOVORIN CALCIUM IJ Inject as directed every 14 (fourteen) days.     Marland Kitchen lisinopril (PRINIVIL,ZESTRIL) 40 MG tablet Take 40 mg by mouth daily.     . ondansetron (ZOFRAN) 8 MG tablet Take 1 tablet (8 mg total) by mouth 2 (two) times daily as needed for refractory nausea / vomiting. Start on day 3 after chemotherapy. 30 tablet 1  . potassium chloride SA (K-DUR,KLOR-CON) 20 MEQ tablet Take 1 tablet (20 mEq total) by mouth daily. 30 tablet 0  . sevelamer carbonate (RENVELA) 800 MG tablet Take 800 mg by mouth 3 (three) times daily  with meals.     . simvastatin (ZOCOR) 80 MG tablet Take 1 tablet by mouth daily at 6 PM.     . Skin Protectants, Misc. (EUCERIN) cream Apply 1 application topically as needed for dry skin.     Marland Kitchen triamcinolone cream (KENALOG) 0.1 % Apply 1 application topically daily as needed (for skin irritation).     Marland Kitchen loperamide (IMODIUM) 2 MG capsule Take 2 capsules after the first loose stool and then 1 capsule every 2 hours until you go a total of 12 hours without having a loose stool. If it is bedtime and you are having loose stools take 2 capsules every 4 hours until morning. Call [facility]. 90 capsule 0       [id number] from the symptoms mentioned above,there are no other symptoms referable to all systems reviewed.  Marland Kitchen amLODipine  20 mg Oral Daily  . atorvastatin  40 mg Oral q1800  .  calcium acetate  667 mg Oral TID WC  . iron polysaccharides  150 mg Oral Daily  . labetalol  300 mg Oral BID  . lisinopril  40 mg Oral Daily  . [START ON 05/08/2016] pantoprazole  40 mg Intravenous Q12H  . sevelamer carbonate  800 mg Oral TID WC  . sodium chloride flush  3 mL Intravenous Q12H      Physical Exam: Vital signs in last 24 hours: Temp:  [97.8 F (36.6 C)-98.3 F (36.8 C)] 97.8 F (36.6 C) (08/09 1300) Pulse Rate:  [73-91] 91 (08/09 1300) Resp:  [15-20] 18 (08/09 1300) BP: (101-143)/(52-73) 143/53 (08/09 1300) SpO2:  [96 %-100 %] 99 % (08/09 1300) Weight:  [255 lb 12.8 oz (116 kg)] 255 lb 12.8 oz (116 kg) (08/08 2104) Weight change:  Last BM Date: 05/05/16  Intake/Output from previous day: 08/08 0701 - 08/09 0700 In: 30 [Blood:30] Out: -  Total I/O In: 0  Out: 950 [Urine:950]   Physical Exam: General- pt is awake,alert, oriented to time place and person Resp- No acute REsp distress, CTA B/L NO Rhonchi CVS- S1S2 regular in rate and rhythm GIT- BS+, soft, NT, ND EXT- NO LE Edema, Cyanosis CNS- CN 2-12 grossly intact. Moving all 4 extremities Psych- normal mod and affect Access- AVF present   Lab Results: CBC  Recent Labs  05/04/16 1616  05/05/16 0615 05/05/16 1313  WBC 3.7*  --  4.1  --   HGB 6.5*  < > 7.9* 7.5*  HCT 19.7*  < > 23.8* 22.7*  PLT 159  --  205  --   < > = values in this interval not displayed.  BMET  Recent Labs  05/04/16 1616  05/05/16 0615 05/05/16 0833 05/05/16 1313  NA 135  --  136  --   --   K 3.8  --  4.3  --   --   CL 98*  --  101  --   --   CO2 28  --  27  --   --   GLUCOSE 145*  < > 155* 146* 127*  BUN 48*  --  52*  --   --   CREATININE 5.75*  --  6.52*  --   --   CALCIUM 7.6*  --  7.7*  --   --   < > = values in this interval not displayed.  MICRO Recent Results (from the past 240 hour(s))  Surgical pcr screen     Status: None   Collection Time: 05/03/16  7:33 AM  Result Value Ref Range Status   MRSA,  PCR NEGATIVE NEGATIVE Final   Staphylococcus aureus NEGATIVE NEGATIVE Final    Comment:        The Xpert SA Assay (FDA approved for NASAL specimens in patients over 64 years of age), is one component of a comprehensive surveillance program.  Test performance has been validated by [facility] for patients greater than or equal to 18 year old. It is not intended to diagnose infection nor to guide or monitor treatment.       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 Calcium just below the goal.  5)Oncology-hx of colon ca Undergoing chemo Primary MD following  6)Electrolytes Normokalemic NOrmonatremic   7)Acid base Co2 at goal     Plan:  NO need for hd today Will dialyze in am Will not use any heparin as pt coming in with possible bleed Will not give any epo as pt with colon ca      BHUTANI,MANPREET S 05/05/2016, 4:16 PM

## 2016-05-06 ENCOUNTER — Encounter (HOSPITAL_COMMUNITY): Payer: Self-pay

## 2016-05-06 ENCOUNTER — Encounter (HOSPITAL_COMMUNITY)
Admission: EM | Disposition: A | Discharge status: Home / Self Care | Payer: Self-pay | Source: Home / Self Care | Attending: Internal Medicine

## 2016-05-06 ENCOUNTER — Inpatient Hospital Stay (HOSPITAL_COMMUNITY): Payer: Medicare Other

## 2016-05-06 DIAGNOSIS — N186 End stage renal disease: Secondary | ICD-10-CM | POA: Diagnosis not present

## 2016-05-06 DIAGNOSIS — K2901 Acute gastritis with bleeding: Secondary | ICD-10-CM

## 2016-05-06 DIAGNOSIS — D62 Acute posthemorrhagic anemia: Secondary | ICD-10-CM | POA: Diagnosis not present

## 2016-05-06 DIAGNOSIS — K3189 Other diseases of stomach and duodenum: Secondary | ICD-10-CM

## 2016-05-06 DIAGNOSIS — C180 Malignant neoplasm of cecum: Secondary | ICD-10-CM | POA: Diagnosis not present

## 2016-05-06 DIAGNOSIS — K922 Gastrointestinal hemorrhage, unspecified: Secondary | ICD-10-CM

## 2016-05-06 DIAGNOSIS — K921 Melena: Secondary | ICD-10-CM | POA: Diagnosis not present

## 2016-05-06 HISTORY — PX: ESOPHAGOGASTRODUODENOSCOPY: SHX5428

## 2016-05-06 LAB — CBC
HCT: 24 % — ABNORMAL LOW (ref 39.0–52.0)
Hemoglobin: 8.1 g/dL — ABNORMAL LOW (ref 13.0–17.0)
MCH: 32.5 pg (ref 26.0–34.0)
MCHC: 33.8 g/dL (ref 30.0–36.0)
MCV: 96.4 fL (ref 78.0–100.0)
PLATELETS: 233 10*3/uL (ref 150–400)
RBC: 2.49 MIL/uL — ABNORMAL LOW (ref 4.22–5.81)
RDW: 16.2 % — AB (ref 11.5–15.5)
WBC: 4.8 10*3/uL (ref 4.0–10.5)

## 2016-05-06 LAB — GLUCOSE, CAPILLARY: Glucose-Capillary: 175 mg/dL — ABNORMAL HIGH (ref 65–99)

## 2016-05-06 LAB — BASIC METABOLIC PANEL WITH GFR
Anion gap: 10 (ref 5–15)
BUN: 61 mg/dL — ABNORMAL HIGH (ref 6–20)
CO2: 26 mmol/L (ref 22–32)
Calcium: 8.1 mg/dL — ABNORMAL LOW (ref 8.9–10.3)
Chloride: 98 mmol/L — ABNORMAL LOW (ref 101–111)
Creatinine, Ser: 7.59 mg/dL — ABNORMAL HIGH (ref 0.61–1.24)
GFR calc Af Amer: 8 mL/min — ABNORMAL LOW
GFR calc non Af Amer: 7 mL/min — ABNORMAL LOW
Glucose, Bld: 200 mg/dL — ABNORMAL HIGH (ref 65–99)
Potassium: 4.5 mmol/L (ref 3.5–5.1)
Sodium: 134 mmol/L — ABNORMAL LOW (ref 135–145)

## 2016-05-06 LAB — GLUCOSE, RANDOM
Glucose, Bld: 160 mg/dL — ABNORMAL HIGH (ref 65–99)
Glucose, Bld: 124 mg/dL — ABNORMAL HIGH (ref 65–99)
Glucose, Bld: 147 mg/dL — ABNORMAL HIGH (ref 65–99)
Glucose, Bld: 154 mg/dL — ABNORMAL HIGH (ref 65–99)

## 2016-05-06 SURGERY — EGD (ESOPHAGOGASTRODUODENOSCOPY)
Anesthesia: Moderate Sedation

## 2016-05-06 MED ORDER — LIDOCAINE HCL (PF) 1 % IJ SOLN: 5.0000 mL | INTRAMUSCULAR | Status: DC | PRN

## 2016-05-06 MED ORDER — MEPERIDINE HCL 50 MG/ML IJ SOLN
INTRAMUSCULAR | Status: AC
  Filled 2016-05-06: qty 1

## 2016-05-06 MED ORDER — LIDOCAINE-PRILOCAINE 2.5-2.5 % EX CREA: 1.0000 "application " | TOPICAL_CREAM | CUTANEOUS | Status: DC | PRN

## 2016-05-06 MED ORDER — FENTANYL CITRATE (PF) 100 MCG/2ML IJ SOLN
INTRAMUSCULAR | Status: DC | PRN
  Administered 2016-05-06 (×2): 25 ug via INTRAVENOUS

## 2016-05-06 MED ORDER — MIDAZOLAM HCL 5 MG/5ML IJ SOLN
INTRAMUSCULAR | Status: AC
  Filled 2016-05-06: qty 5

## 2016-05-06 MED ORDER — SODIUM CHLORIDE 0.9 % IV SOLN: 100.0000 mL | INTRAVENOUS | Status: DC | PRN

## 2016-05-06 MED ORDER — PANTOPRAZOLE SODIUM 40 MG PO TBEC
40.0000 mg | DELAYED_RELEASE_TABLET | Freq: Every day | ORAL | Status: DC
  Administered 2016-05-07: 40 mg via ORAL
  Filled 2016-05-06: qty 1

## 2016-05-06 MED ORDER — PENTAFLUOROPROP-TETRAFLUOROETH EX AERO: 1.0000 "application " | INHALATION_SPRAY | CUTANEOUS | Status: DC | PRN

## 2016-05-06 MED ORDER — MIDAZOLAM HCL 5 MG/5ML IJ SOLN
INTRAMUSCULAR | Status: DC | PRN
  Administered 2016-05-06: 1 mg via INTRAVENOUS
  Administered 2016-05-06: 2 mg via INTRAVENOUS

## 2016-05-06 MED ORDER — FENTANYL CITRATE (PF) 100 MCG/2ML IJ SOLN
INTRAMUSCULAR | Status: AC
  Filled 2016-05-06: qty 2

## 2016-05-06 NOTE — Progress Notes (Signed)
Inpatient Diabetes Program Recommendations  AACE/ADA: New Consensus Statement on Inpatient Glycemic Control (2015)  Target Ranges:  Prepandial:   less than 140 mg/dL      Peak postprandial:   less than 180 mg/dL (1-2 hours)      Critically ill patients:  140 - 180 mg/dL   Results for FINCH, PFUHL (MRN [id number]) as of 05/06/2016 09:09  Ref. Range 05/05/2016 08:33 05/05/2016 13:13 05/05/2016 17:33 05/05/2016 23:20 05/06/2016 03:10  Glucose Latest Ref Range: 65 - 99 mg/dL 146 (H) 127 (H) 258 (H) 228 (H) 200 (H)   Review of Glycemic Control  Diabetes history: DM2 Outpatient Diabetes medications: Glipizide 10 mg BID Current orders for Inpatient glycemic control: CBGs Q4H  Inpatient Diabetes Program Recommendations: Correction (SSI): Please consider ordering CBGs with Novolog moderate correction scale Q4H.  Thanks, Barnie Alderman, RN, MSN, CDE Diabetes Coordinator Inpatient Diabetes Program [phone] (Team Pager from [place] to [place]) [phone] ([facility] office) [phone] [facility] office) [phone] [facility] office)

## 2016-05-06 NOTE — Procedures (Signed)
   HEMODIALYSIS TREATMENT NOTE:  3.7 hour HD session completed via right upper arm AVF (15g/antegrade). Goal met: 3 liters removed without interruption in ultrafiltration.  Hd was discontinued 20 minutes early so pt could have endoscopy.  Hemodynamically stable throughout session. All blood was returned and hemostasis was achieved within 7 minutes. Pt was transported to endoscopy center via wheelchair.  Report called to [place], Therapist, sports.  Rockwell Alexandria, RN, CDN

## 2016-05-06 NOTE — Progress Notes (Signed)
Subjective: Interval History: has no complaint of difficulty in breathing. Presently he feels much better. Patient however complains of nasal bleeding this morning..  Objective: Vital signs in last 24 hours: Temp:  [97.1 F (36.2 C)-98.1 F (36.7 C)] 98.1 F (36.7 C) (08/10 0456) Pulse Rate:  [76-91] 76 (08/10 0456) Resp:  [14-20] 20 (08/10 0456) BP: (135-160)/(53-79) 160/79 (08/10 0456) SpO2:  [98 %-100 %] 100 % (08/10 0456) Weight:  [117.1 kg (258 lb 3.2 oz)] 117.1 kg (258 lb 3.2 oz) (08/10 0456) Weight change: 1.089 kg (2 lb 6.4 oz)  Intake/Output from previous day: 08/09 0701 - 08/10 0700 In: 340 [P.O.:240; I.V.:100] Out: 950 [Urine:950] Intake/Output this shift: Total I/O In: -  Out: 200 [Urine:200]  General appearance: alert, cooperative and no distress Resp: diminished breath sounds bilaterally Cardio: regular rate and rhythm Extremities: edema He has 1+ bilateral edema and venous stasis.  Lab Results:  Recent Labs  05/05/16 0615 05/05/16 1313 05/06/16 0310  WBC 4.1  --  4.8  HGB 7.9* 7.5* 8.1*  HCT 23.8* 22.7* 24.0*  PLT 205  --  233   BMET:  Recent Labs  05/05/16 0615  05/05/16 2320 05/06/16 0310  NA 136  --   --  134*  K 4.3  --   --  4.5  CL 101  --   --  98*  CO2 27  --   --  26  GLUCOSE 155*  < > 228* 200*  BUN 52*  --   --  61*  CREATININE 6.52*  --   --  7.59*  CALCIUM 7.7*  --   --  8.1*  < > = values in this interval not displayed. No results for input(s): PTH in the last 72 hours. Iron Studies: No results for input(s): IRON, TIBC, TRANSFERRIN, FERRITIN in the last 72 hours.  Studies/Results: No results found.  I have reviewed the patient's current medications.  Assessment/Plan: Problem #1 severe weakness: Possibly related to his anemia. Presently he is feeling much better. Problem #2 GI bleeding. Patient has received a unit of packed red blood cells and his hemoglobin improved to 8.1. Presently he is asymptomatic. Patient just come  from upper endoscopy. Patient denies any frank bleeding. Problem #3 end-stage renal disease: Presently his potassium is normal and patient doesn't have any nausea or vomiting. Problem #4 history of diabetes Problem #5 hypertension: His blood pressure is reasonably controlled Problem #6 metabolic bone disease: His calcium is range. Problem #7 history of colon cancer: Presently patient is receiving chemotherapy. Plan: 1]We'll make arrangements for patient to get dialysis today 2] we'll try to move about 3 L if his blood pressure tolerates 3] we'll give him a unit of blood 4] we'll dialyze him without any heparin.  5] we'll check CBC and renal panel in the morning.   LOS: 2 days   Levander Katzenstein S 05/06/2016,8:45 AM

## 2016-05-06 NOTE — Op Note (Signed)
[facility] Patient Name: Nathaniel Johnston Procedure Date: 05/06/2016 4:10 PM MRN: [id number] Date of Birth: 03-11-1956 Attending MD: Hildred Laser , MD CSN: [id number] Age: 60 Admit Type: Inpatient Procedure:                Upper GI endoscopy Indications:              Melena and acute on chronic anemia Providers:                Hildred Laser, MD, Janeece Riggers, RN, Randa Spike,                            Technician Referring MD:             Fransico Him, MD Medicines:                Cetacaine spray, Fentanyl 50 micrograms IV,                            Midazolam 3 mg IV Complications:            No immediate complications. Estimated Blood Loss:     Estimated blood loss: none. Procedure:                Pre-Anesthesia Assessment:                           - Prior to the procedure, a History and Physical                            was performed, and patient medications and                            allergies were reviewed. The patient's tolerance of                            previous anesthesia was also reviewed. The risks                            and benefits of the procedure and the sedation                            options and risks were discussed with the patient.                            All questions were answered, and informed consent                            was obtained. Prior Anticoagulants: The patient has                            taken no previous anticoagulant or antiplatelet                            agents. ASA Grade Assessment: III - A patient with  severe systemic disease. After reviewing the risks                            and benefits, the patient was deemed in                            satisfactory condition to undergo the procedure.                           After obtaining informed consent, the endoscope was                            passed under direct vision. Throughout the   procedure, the patient's blood pressure, pulse, and                            oxygen saturations were monitored continuously. The                            [id number]) scope was introduced through the                            mouth, and advanced to the second part of duodenum.                            The upper GI endoscopy was accomplished without                            difficulty. The patient tolerated the procedure                            well. Scope In: 4:25:15 PM Scope Out: 4:31:41 PM Total Procedure Duration: 0 hours 6 minutes 26 seconds  Findings:      The examined esophagus was normal.      The Z-line was regular and was found 45 cm from the incisors.      Localized mildly erythematous mucosa without bleeding was found in the       gastric body.      A medium amount of food (residue) was found in the gastric body and in       the gastric antrum.      The exam of the stomach was otherwise normal.      The duodenal bulb and second portion of the duodenum were normal.      Very prominent ampulla of Vater. Impression:               - Normal esophagus.                           - Z-line regular, 45 cm from the incisors.                           - Erythematous mucosa in the gastric body.                           - A medium amount of food (  residue) in the stomach.                           - Normal duodenal bulb and second portion of the                            duodenum.                           - No specimens collected.                           - Comment: no bleeding lesion identified on EGD. Moderate Sedation:      Moderate (conscious) sedation was administered by the endoscopy nurse       and supervised by the endoscopist. The following parameters were       monitored: oxygen saturation, heart rate, blood pressure, CO2       capnography and response to care. Total physician intraservice time was       10 minutes. Recommendation:           - Return  patient to hospital ward for ongoing care.                           - Resume previous diet today.                           - Continue present medications.                           - Check hemogram with white blood cell count and                            platelets tomorrow.                           - if further evaluation warranted will consider                            given capsule study on outpatient basis. Procedure Code(s):        --- Professional ---                           [id number], Esophagogastroduodenoscopy, flexible,                            transoral; diagnostic, including collection of                            specimen(s) by brushing or washing, when performed                            (separate procedure)                           99152, Moderate sedation services provided by the  same physician or other qualified health care                            professional performing the diagnostic or                            therapeutic service that the sedation supports,                            requiring the presence of an independent trained                            observer to assist in the monitoring of the                            patient's level of consciousness and physiological                            status; initial 15 minutes of intraservice time,                            patient age 42 years or older Diagnosis Code(s):        --- Professional ---                           K31.89, Other diseases of stomach and duodenum                           K92.1, Melena (includes Hematochezia)                           D62, Acute posthemorrhagic anemia CPT copyright 2016 American Medical Association. All rights reserved. The codes documented in this report are preliminary and upon coder review may  be revised to meet current compliance requirements. Hildred Laser, MD Hildred Laser, MD 05/06/2016 4:45:29 PM This report has been signed  electronically. Number of Addenda: 0

## 2016-05-06 NOTE — Progress Notes (Signed)
PROGRESS NOTE    Nathaniel Johnston  [id number] DOB: 1956/08/14 DOA: 05/04/2016 PCP: No PCP Per Patient     Brief Narrative:  60 year old man admitted on 8/8 from home with complaints of melena and generalized weakness. Found to have a hemoglobin of 6.5. Has end-stage renal disease. For EGD 8/10.   Assessment & Plan:   Active Problems:   High cholesterol   Adenocarcinoma of cecum ([facility])   DNR (do not resuscitate)   ESRD (end stage renal disease) ([place])   GI bleed   Acute blood loss anemia   Melena/Acute Blood Loss Anemia -Has a history of adenocarcinoma of the cecum. -Seen by GI with plans for initially an endoscopy today. -Continue PPI. -Has received 1 unit of PRBCs for hemoglobin of 6.5 with resultant hemoglobin of 7.5. -Has had no further melena today. -Renal planning on transfusing another unit of PRBCs during HD today.  Colon cancer -Receiving active chemotherapy, will follow up with oncology in the outpatient setting.  End-stage renal disease -On hemodialysis Tuesday, Thursday, Saturday. -Nephrology consultation has been requested.   DVT prophylaxis: SCDs Code Status: DO NOT RESUSCITATE Family Communication: Daughter at bedside updated on plan of care and questions answered on 8/9. Disposition Plan: For EGD today.  Consultants:   GI  Procedures:   None  Antimicrobials:   None    Subjective: Feels better, no further melena, no abdominal pain  Objective: Vitals:   05/06/16 1230 05/06/16 1300 05/06/16 1330 05/06/16 1400  BP: (!) 172/75 (!) 157/80 (!) 143/78 (!) 155/77  Pulse: 77 76 75 77  Resp:      Temp:      TempSrc:      SpO2:      Weight:      Height:        Intake/Output Summary (Last 24 hours) at 05/06/16 1415 Last data filed at 05/06/16 0820  Gross per 24 hour  Intake              340 ml  Output              200 ml  Net              140 ml   Filed Weights   05/04/16 2104 05/06/16 0456  Weight: 116 kg (255 lb 12.8 oz) 117.1 kg (258  lb 3.2 oz)    Examination:  General exam: Alert, awake, oriented x 3 Respiratory system: Clear to auscultation. Respiratory effort normal. Cardiovascular system:RRR. No murmurs, rubs, gallops. Gastrointestinal system: Abdomen is nondistended, soft and nontender. No organomegaly or masses felt. Normal bowel sounds heard. Central nervous system: Alert and oriented. No focal neurological deficits. Extremities: No C/C/E, +pedal pulses Skin: No rashes, lesions or ulcers Psychiatry: Judgement and insight appear normal. Mood & affect appropriate.     Data Reviewed: I have personally reviewed following labs and imaging studies  CBC:  Recent Labs Lab 05/04/16 1616 05/04/16 2300 05/05/16 0615 05/05/16 1313 05/06/16 0310  WBC 3.7*  --  4.1  --  4.8  NEUTROABS 3.0  --   --   --   --   HGB 6.5* 6.9* 7.9* 7.5* 8.1*  HCT 19.7* 20.7* 23.8* 22.7* 24.0*  MCV 99.5  --  97.9  --  96.4  PLT 159  --  205  --  0000000   Basic Metabolic Panel:  Recent Labs Lab 05/01/16 1432 05/03/16 0609 05/04/16 1616 05/04/16 2300 05/05/16 0615  05/05/16 1733 05/05/16 2320 05/06/16 0310 05/06/16 0846 05/06/16  1254  NA 136 137 135  --  136  --   --   --  134*  --   --   K 5.2* 4.6 3.8  --  4.3  --   --   --  4.5  --   --   CL 96* 98* 98*  --  101  --   --   --  98*  --   --   CO2 27 29 28   --  27  --   --   --  26  --   --   GLUCOSE 187* 89 145* 150* 155*  < > 258* 228* 200* 160* 124*  BUN 59* 53* 48*  --  52*  --   --   --  61*  --   --   CREATININE 7.38* 6.28* 5.75*  --  6.52*  --   --   --  7.59*  --   --   CALCIUM 9.3 7.9* 7.6*  --  7.7*  --   --   --  8.1*  --   --   MG 3.5*  --  2.5* 2.4  --   --   --   --   --   --   --   PHOS 4.7*  --   --  4.0  --   --   --   --   --   --   --   < > = values in this interval not displayed. GFR: Estimated Creatinine Clearance: 13.5 mL/min (by C-G formula based on SCr of 7.59 mg/dL). Liver Function Tests:  Recent Labs Lab 05/04/16 1616 05/05/16 0615  AST  19 20  ALT 15* 16*  ALKPHOS 44 45  BILITOT 0.3 0.4  PROT 5.8* 6.1*  ALBUMIN 3.1* 3.3*   No results for input(s): LIPASE, AMYLASE in the last 168 hours. No results for input(s): AMMONIA in the last 168 hours. Coagulation Profile: No results for input(s): INR, PROTIME in the last 168 hours. Cardiac Enzymes:  Recent Labs Lab 05/04/16 1616  TROPONINI 0.03*   BNP (last 3 results) No results for input(s): PROBNP in the last 8760 hours. HbA1C: No results for input(s): HGBA1C in the last 72 hours. CBG:  Recent Labs Lab 05/03/16 0900 05/03/16 1157 05/03/16 2036  GLUCAP 94 102* 194*   Lipid Profile: No results for input(s): CHOL, HDL, LDLCALC, TRIG, CHOLHDL, LDLDIRECT in the last 72 hours. Thyroid Function Tests: No results for input(s): TSH, T4TOTAL, FREET4, T3FREE, THYROIDAB in the last 72 hours. Anemia Panel: No results for input(s): VITAMINB12, FOLATE, FERRITIN, TIBC, IRON, RETICCTPCT in the last 72 hours. Urine analysis:    Component Value Date/Time   COLORURINE RED (A) 04/15/2016 1520   APPEARANCEUR HAZY (A) 04/15/2016 1520   LABSPEC 1.010 04/15/2016 1520   PHURINE 8.5 (H) 04/15/2016 1520   GLUCOSEU 500 (A) 04/15/2016 1520   HGBUR LARGE (A) 04/15/2016 1520   BILIRUBINUR NEGATIVE 04/15/2016 1520   KETONESUR NEGATIVE 04/15/2016 1520   PROTEINUR >300 (A) 04/15/2016 1520   NITRITE NEGATIVE 04/15/2016 1520   LEUKOCYTESUR TRACE (A) 04/15/2016 1520   Sepsis Labs: @LABRCNTIP (procalcitonin:4,lacticidven:4)  ) Recent Results (from the past 240 hour(s))  Surgical pcr screen     Status: None   Collection Time: 05/03/16  7:33 AM  Result Value Ref Range Status   MRSA, PCR NEGATIVE NEGATIVE Final   Staphylococcus aureus NEGATIVE NEGATIVE Final    Comment:        The  Xpert SA Assay (FDA approved for NASAL specimens in patients over 39 years of age), is one component of a comprehensive surveillance program.  Test performance has been validated by [facility] for  patients greater than or equal to 25 year old. It is not intended to diagnose infection nor to guide or monitor treatment.          Radiology Studies: No results found.      Scheduled Meds: . amLODipine  20 mg Oral Daily  . atorvastatin  40 mg Oral q1800  . calcium acetate  667 mg Oral TID WC  . labetalol  300 mg Oral BID  . lisinopril  40 mg Oral Daily  . [START ON 05/08/2016] pantoprazole  40 mg Intravenous Q12H  . sevelamer carbonate  800 mg Oral TID WC  . sodium chloride flush  3 mL Intravenous Q12H   Continuous Infusions: . pantoprozole (PROTONIX) infusion 8 mg/hr (05/04/16 2100)     LOS: 2 days    Time spent: 25 minutes. Greater than 50% of this time was spent in direct contact with the patient coordinating care.     Lelon Frohlich, MD [facility] Pager [phone]  If 7PM-7AM, please contact night-coverage [email or web address] Password [email or web address] 05/06/2016, 2:15 PM

## 2016-05-07 DIAGNOSIS — K3189 Other diseases of stomach and duodenum: Secondary | ICD-10-CM

## 2016-05-07 DIAGNOSIS — K921 Melena: Secondary | ICD-10-CM | POA: Diagnosis not present

## 2016-05-07 DIAGNOSIS — K922 Gastrointestinal hemorrhage, unspecified: Secondary | ICD-10-CM | POA: Diagnosis not present

## 2016-05-07 DIAGNOSIS — C180 Malignant neoplasm of cecum: Secondary | ICD-10-CM | POA: Diagnosis not present

## 2016-05-07 DIAGNOSIS — N186 End stage renal disease: Secondary | ICD-10-CM | POA: Diagnosis not present

## 2016-05-07 DIAGNOSIS — D62 Acute posthemorrhagic anemia: Secondary | ICD-10-CM | POA: Diagnosis not present

## 2016-05-07 LAB — GLUCOSE, CAPILLARY
Glucose-Capillary: 164 mg/dL — ABNORMAL HIGH (ref 65–99)
Glucose-Capillary: 169 mg/dL — ABNORMAL HIGH (ref 65–99)
Glucose-Capillary: 156 mg/dL — ABNORMAL HIGH (ref 65–99)
Glucose-Capillary: 156 mg/dL — ABNORMAL HIGH (ref 65–99)

## 2016-05-07 LAB — CBC
HEMATOCRIT: 22.9 % — AB (ref 39.0–52.0)
HEMOGLOBIN: 7.8 g/dL — AB (ref 13.0–17.0)
MCH: 33.2 pg (ref 26.0–34.0)
MCHC: 34.1 g/dL (ref 30.0–36.0)
MCV: 97.4 fL (ref 78.0–100.0)
Platelets: 237 10*3/uL (ref 150–400)
RBC: 2.35 MIL/uL — ABNORMAL LOW (ref 4.22–5.81)
RDW: 16.3 % — AB (ref 11.5–15.5)
WBC: 4.3 10*3/uL (ref 4.0–10.5)

## 2016-05-07 LAB — PREPARE RBC (CROSSMATCH)

## 2016-05-07 MED ORDER — HEPARIN SOD (PORK) LOCK FLUSH 100 UNIT/ML IV SOLN
500.0000 [IU] | INTRAVENOUS | Status: AC | PRN
  Administered 2016-05-07: 500 [IU]
  Filled 2016-05-07: qty 5

## 2016-05-07 MED ORDER — SODIUM CHLORIDE 0.9 % IV SOLN: Freq: Once | INTRAVENOUS | Status: DC

## 2016-05-07 NOTE — Progress Notes (Signed)
Subjective: Interval History: Patient feels much better. Presently he denies any weakness or dizziness. His appetite is good no history of GI bleeding.  Objective: Vital signs in last 24 hours: Temp:  [97.8 F (36.6 C)-98.4 F (36.9 C)] 98.4 F (36.9 C) (08/11 0457) Pulse Rate:  [73-77] 75 (08/11 0457) Resp:  [15-20] 20 (08/11 0457) BP: (117-180)/(40-87) 122/54 (08/11 0457) SpO2:  [99 %-100 %] 100 % (08/11 0457) Weight:  [114 kg (251 lb 5.2 oz)] 114 kg (251 lb 5.2 oz) (08/11 0457) Weight change: -3.119 kg (-6 lb 14 oz)  Intake/Output from previous day: 08/10 0701 - 08/11 0700 In: 810 [P.O.:510; I.V.:300] Out: 3500 [Urine:500] Intake/Output this shift: No intake/output data recorded.  General appearance: alert, cooperative and no distress Resp: diminished breath sounds bilaterally Cardio: regular rate and rhythm Extremities: edema He has 1+ bilateral edema and venous stasis.  Lab Results:  Recent Labs  05/06/16 0310 05/07/16 0533  WBC 4.8 4.3  HGB 8.1* 7.8*  HCT 24.0* 22.9*  PLT 233 237   BMET:  Recent Labs  05/05/16 0615  05/06/16 0310  05/06/16 1750 05/06/16 2109  NA 136  --  134*  --   --   --   K 4.3  --  4.5  --   --   --   CL 101  --  98*  --   --   --   CO2 27  --  26  --   --   --   GLUCOSE 155*  < > 200*  < > 147* 154*  BUN 52*  --  61*  --   --   --   CREATININE 6.52*  --  7.59*  --   --   --   CALCIUM 7.7*  --  8.1*  --   --   --   < > = values in this interval not displayed. No results for input(s): PTH in the last 72 hours. Iron Studies: No results for input(s): IRON, TIBC, TRANSFERRIN, FERRITIN in the last 72 hours.  Studies/Results: No results found.  I have reviewed the patient's current medications.  Assessment/Plan: Problem #1 severe weakness: Presently patient is feeling much better. Problem #2 GI bleeding. Patient does not have any bleeding. Patient has upper GI endoscopy done yesterday and there is no active bleeding. Presently his  hemoglobin has declined slightly but patient is asymptomatic. Problem #3 end-stage renal disease: Presently his potassium is normal and patient is asymptomatic. Problem #4 history of diabetes Problem #5 hypertension: His blood pressure is reasonably controlled Problem #6 metabolic bone disease: His calcium and phosphorus is 0 range. Problem #7 history of colon cancer: Presently patient is receiving chemotherapy. Plan: 1] patient does require dialysis today. He is due for dialysis tomorrow. 2] we'll try to move about 3 L if his blood pressure tolerate and possibly patient may require a unit of blood to be transfused.  3] we'll check CBC and renal panel in the morning.   LOS: 2 days   Mallie Giambra S 05/07/2016,8:04 AM

## 2016-05-07 NOTE — Progress Notes (Signed)
Patient discharged home.  Port flushed with heparin per protocol and de-accessed.  Reviewed meds and DC instructions.  Advised to follow up with GI if bloody BMs return.  Verbalizes understanding.  Assisted off unit via [place] by NT in NAD.

## 2016-05-07 NOTE — Discharge Summary (Signed)
Physician Discharge Summary  Nathaniel Johnston [id number] DOB: 1956/08/19 DOA: 05/04/2016  PCP: No PCP Per Patient  Admit date: 05/04/2016 Discharge date: 05/07/2016  Time spent: 45 minutes  Recommendations for Outpatient Follow-up:  -Will be discharged home today. -Please check CBC with HD in am to monitor Hb level s/p transfusion.   Discharge Diagnoses:  Active Problems:   High cholesterol   Adenocarcinoma of cecum (HCC)   DNR (do not resuscitate)   ESRD (end stage renal disease) ([place])   GI bleed   Acute blood loss anemia   Discharge Condition: Stable and improved  Filed Weights   05/04/16 2104 05/06/16 0456 05/07/16 0457  Weight: 116 kg (255 lb 12.8 oz) 117.1 kg (258 lb 3.2 oz) 114 kg (251 lb 5.2 oz)    History of present illness:  As per Dr. Marthenia Rolling on 42/34: 60 year old African American Male with history significant for ESRD on HD TTS, CVA with left sided hemiparesis and colon cancer for which the patient is currently undergoing chemotherapy with reported post chemotherapy anemia. Patient presents with weakness, fatigue and dark colored stool. CBC done in ER revealed Hb of 6.5g/dl. No BRBPR. No headache, no chest pain, no Gastroenterology symptoms or urinary symptom. No headache, no neck pain, no chest pain. Patient tells me that he still makes urine. Patient was able to tolerate only 3 hours of 4.5 hours of dialysis planned for today.  Hospital Course:   Melena/Acute Blood Loss Anemia -Has a history of adenocarcinoma of the cecum. -Seen by GI with plans EGD with results as below. -Has received 1 unit of PRBCs for hemoglobin of 6.5 with resultant hemoglobin of 7.8. Will be transfused an additional unit prior to DC. -Has had no further melena today. -Will need to follow up with GI if continues to have bloody stools. No further work up planned for at present.  Colon cancer -Receiving active chemotherapy, will follow up with oncology in the outpatient setting.  End-stage  renal disease -On hemodialysis Tuesday, Thursday, Saturday. -Nephrology consultation has been requested.  Procedures: EGD 8/10: Impression:               - Normal esophagus.                           - Z-line regular, 45 cm from the incisors.                           - Erythematous mucosa in the gastric body.                           - A medium amount of food (residue) in the stomach.                           - Normal duodenal bulb and second portion of the                            duodenum.                           - No specimens collected.                            -  Comment: no bleeding lesion identified on EGD.    Consultations:  Nephrology  GI  Discharge Instructions  Discharge Instructions    Diet - low sodium heart healthy    Complete by:  As directed   Increase activity slowly    Complete by:  As directed       Medication List    TAKE these medications   amLODipine 10 MG tablet Commonly known as:  NORVASC Take 2 tablets by mouth daily.   calcium acetate 667 MG capsule Commonly known as:  PHOSLO Take 1 capsule by mouth 3 (three) times daily with meals.   dexamethasone 4 MG tablet Commonly known as:  DECADRON Take 2 tablets (8 mg total) by mouth daily. Start the day after chemo for 2 days.   ERBITUX IV Inject into the vein every 14 (fourteen) days. Every 2 weeks   eucerin cream Apply 1 application topically as needed for dry skin.   fluorouracil CALGB 28413 in sodium chloride 0.9 % 150 mL Inject into the vein over 24 hr.   glipiZIDE 10 MG tablet Commonly known as:  GLUCOTROL Take 1 tablet by mouth 2 (two) times daily before a meal.   HYDROcodone-acetaminophen 5-325 MG tablet Commonly known as:  NORCO/VICODIN Take 1 tablet by mouth every 6 (six) hours as needed for moderate pain.   irinotecan in dextrose 5 % 500 mL Inject into the vein every 14 (fourteen) days. Every 2 weeks   Iron Polysacch Cmplx-B12-FA 150-0.025-1 MG Caps Take 1 capsule  by mouth daily.   labetalol 300 MG tablet Commonly known as:  NORMODYNE Take 300 mg by mouth 2 (two) times daily.   LEUCOVORIN CALCIUM IJ Inject as directed every 14 (fourteen) days.   lisinopril 40 MG tablet Commonly known as:  PRINIVIL,ZESTRIL Take 40 mg by mouth daily.   loperamide 2 MG capsule Commonly known as:  IMODIUM Take 2 capsules after the first loose stool and then 1 capsule every 2 hours until you go a total of 12 hours without having a loose stool. If it is bedtime and you are having loose stools take 2 capsules every 4 hours until morning. Call [place].   ondansetron 8 MG tablet Commonly known as:  ZOFRAN Take 1 tablet (8 mg total) by mouth 2 (two) times daily as needed for refractory nausea / vomiting. Start on day 3 after chemotherapy.   potassium chloride SA 20 MEQ tablet Commonly known as:  K-DUR,KLOR-CON Take 1 tablet (20 mEq total) by mouth daily.   RENVELA 800 MG tablet Generic drug:  sevelamer carbonate Take 800 mg by mouth 3 (three) times daily with meals.   simvastatin 80 MG tablet Commonly known as:  ZOCOR Take 1 tablet by mouth daily at 6 PM.   triamcinolone cream 0.1 % Commonly known as:  KENALOG Apply 1 application topically daily as needed (for skin irritation).      No Known Allergies    The results of significant diagnostics from this hospitalization (including imaging, microbiology, ancillary and laboratory) are listed below for reference.    Significant Diagnostic Studies: Chest 2 View  Result Date: 05/03/2016 CLINICAL DATA:  Preop fistula declot EXAM: CHEST  2 VIEW COMPARISON:  03/24/2016 FINDINGS: There is a vascular stent identified along the course of the right subclavian vein. Left chest wall port a catheter is noted with tip in the projection of the cavoatrial junction. Normal heart size. Linear scar like densities noted in the left midlung and right base. IMPRESSION: 1. No  acute cardiopulmonary abnormalities. Electronically  Signed   By: Kerby Moors M.D.   On: 05/03/2016 09:30   [place] Upper Ext Art Right Ltd  Result Date: 05/02/2016 CLINICAL DATA:  60 year old male with bilateral shoulder pain and inability to access right upper arm hemodialysis access EXAM: RIGHT UPPER EXTREMITY ARTERIAL DUPLEX SCAN TECHNIQUE: Gray-scale sonography as well as color Doppler and duplex ultrasound was performed to evaluate the arteries of the upper extremity. COMPARISON:  None. FINDINGS: Sonographic interrogation of the brachial artery to basilic vein arteriovenous graft. The arterial anastomosis is widely patent. There is elevation of the peak systolic velocity to [id number] centimeters/second compared to 123 centimeters/second in the brachial artery proximally. The graft demonstrates partial peripheral calcification resulting in shadowing on some images. There is evidence of a narrowing at the venous anastomosis with a significantly elevated peak systolic velocity at XX123456 centimeters/second, diffuse spectral broadening and aliasing. This is consistent with a hemodynamically significant stenosis. At a point labeled as the access site there is peripheral heterogeneous echogenicity surrounding the graft material likely representing old hematoma. Additionally, there is some a documented elevation of the peak systolic velocity at the apex of the loop graft. This may be artifactual and related to the curvature of the graft. IMPRESSION: 1. Patent right brachiobasilic arteriovenous loop graft. 2. 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 3. Also suspect a more moderate stenosis at the arterial anastomosis. 4. Old hematoma surrounds the graft in the region of the 1 of the cannulation sites. Signed, Criselda Peaches, MD [facility] Electronically Signed   By: Jacqulynn Cadet M.D.   On: 05/02/2016 08:42    Microbiology: Recent Results (from the past 240 hour(s))  Surgical pcr screen     Status:  None   Collection Time: 05/03/16  7:33 AM  Result Value Ref Range Status   MRSA, PCR NEGATIVE NEGATIVE Final   Staphylococcus aureus NEGATIVE NEGATIVE Final    Comment:        The Xpert SA Assay (FDA approved for NASAL specimens in patients over 77 years of age), is one component of a comprehensive surveillance program.  Test performance has been validated by [facility] for patients greater than or equal to 41 year old. It is not intended to diagnose infection nor to guide or monitor treatment.      Labs: Basic Metabolic Panel:  Recent Labs Lab 05/01/16 1432 05/03/16 0609 05/04/16 1616 05/04/16 2300 05/05/16 0615  05/06/16 0310 05/06/16 0846 05/06/16 1254 05/06/16 1750 05/06/16 2109  NA 136 137 135  --  136  --  134*  --   --   --   --   K 5.2* 4.6 3.8  --  4.3  --  4.5  --   --   --   --   CL 96* 98* 98*  --  101  --  98*  --   --   --   --   CO2 27 29 28   --  27  --  26  --   --   --   --   GLUCOSE 187* 89 145* 150* 155*  < > 200* 160* 124* 147* 154*  BUN 59* 53* 48*  --  52*  --  61*  --   --   --   --   CREATININE 7.38* 6.28* 5.75*  --  6.52*  --  7.59*  --   --   --   --   CALCIUM  9.3 7.9* 7.6*  --  7.7*  --  8.1*  --   --   --   --   MG 3.5*  --  2.5* 2.4  --   --   --   --   --   --   --   PHOS 4.7*  --   --  4.0  --   --   --   --   --   --   --   < > = values in this interval not displayed. Liver Function Tests:  Recent Labs Lab 05/04/16 1616 05/05/16 0615  AST 19 20  ALT 15* 16*  ALKPHOS 44 45  BILITOT 0.3 0.4  PROT 5.8* 6.1*  ALBUMIN 3.1* 3.3*   No results for input(s): LIPASE, AMYLASE in the last 168 hours. No results for input(s): AMMONIA in the last 168 hours. CBC:  Recent Labs Lab 05/04/16 1616 05/04/16 2300 05/05/16 0615 05/05/16 1313 05/06/16 0310 05/07/16 0533  WBC 3.7*  --  4.1  --  4.8 4.3  NEUTROABS 3.0  --   --   --   --   --   HGB 6.5* 6.9* 7.9* 7.5* 8.1* 7.8*  HCT 19.7* 20.7* 23.8* 22.7* 24.0* 22.9*  MCV 99.5  --   97.9  --  96.4 97.4  PLT 159  --  205  --  233 237   Cardiac Enzymes:  Recent Labs Lab 05/04/16 1616  TROPONINI 0.03*   BNP: BNP (last 3 results) No results for input(s): BNP in the last 8760 hours.  ProBNP (last 3 results) No results for input(s): PROBNP in the last 8760 hours.  CBG:  Recent Labs Lab 05/03/16 2036 05/06/16 2357 05/07/16 0357 05/07/16 0805 05/07/16 1121  GLUCAP 194* 175* 164* 169* 156*       Signed:  HERNANDEZ ACOSTA,Nathaniel Johnston  [facility] Pager: [phone] 05/07/2016, 3:52 PM

## 2016-05-07 NOTE — Progress Notes (Signed)
  Subjective:  Patient has no complaints. He has good appetite. He denies abdominal pain nausea or vomiting. He states he is on low-fat diet. She says she has not experienced early satiety or regurgitation.  Objective: Blood pressure (!) 137/49, pulse 76, temperature 98.2 F (36.8 C), temperature source Oral, resp. rate 20, height 6' 0.1" (1.831 m), weight 251 lb 5.2 oz (114 kg), SpO2 100 %. Patient is alert and in no acute distress.  Labs/studies Results:   Recent Labs  05/05/16 0615 05/05/16 1313 05/06/16 0310 05/07/16 0533  WBC 4.1  --  4.8 4.3  HGB 7.9* 7.5* 8.1* 7.8*  HCT 23.8* 22.7* 24.0* 22.9*  PLT 205  --  233 237    BMET   Recent Labs  05/04/16 1616  05/05/16 0615  05/06/16 0310  05/06/16 1254 05/06/16 1750 05/06/16 2109  NA 135  --  136  --  134*  --   --   --   --   K 3.8  --  4.3  --  4.5  --   --   --   --   CL 98*  --  101  --  98*  --   --   --   --   CO2 28  --  27  --  26  --   --   --   --   GLUCOSE 145*  < > 155*  < > 200*  < > 124* 147* 154*  BUN 48*  --  52*  --  61*  --   --   --   --   CREATININE 5.75*  --  6.52*  --  7.59*  --   --   --   --   CALCIUM 7.6*  --  7.7*  --  8.1*  --   --   --   --   < > = values in this interval not displayed.  LFT   Recent Labs  05/04/16 1616 05/05/16 0615  PROT 5.8* 6.1*  ALBUMIN 3.1* 3.3*  AST 19 20  ALT 15* 16*  ALKPHOS 44 45  BILITOT 0.3 0.4     Assessment:  Acute on chronic anemia. Multifactorial anemia secondary to GI blood loss as well as chronic conditions. Hemoglobin is low. Discussed with Dr. Isaac Bliss. Patient will receive another unit of PRBCs at the time of hemodialysis tomorrow prior to discharge.  Recommendations:  Will do another Hemoccults while's and the hospital. If he has ever GI bleed will consider small bowel given capsule study.

## 2016-05-07 NOTE — Care Management Important Message (Signed)
Important Message  Patient Details  Name: Nathaniel Johnston MRN: [id number] Date of Birth: 12-26-1955   Medicare Important Message Given:  Yes    Skilynn Durney, Chauncey [place], RN 05/07/2016, 11:27 AM

## 2016-05-08 LAB — TYPE AND SCREEN
ABO/RH(D): A POS
Antibody Screen: NEGATIVE
UNIT DIVISION: 0
Unit division: 0

## 2016-05-11 ENCOUNTER — Encounter (HOSPITAL_COMMUNITY): Payer: Self-pay | Admitting: Surgery

## 2016-05-12 ENCOUNTER — Encounter (HOSPITAL_COMMUNITY): Payer: Self-pay | Admitting: Internal Medicine

## 2016-05-13 ENCOUNTER — Encounter (HOSPITAL_BASED_OUTPATIENT_CLINIC_OR_DEPARTMENT_OTHER): Payer: Medicare Other | Admitting: Oncology

## 2016-05-13 ENCOUNTER — Encounter (HOSPITAL_COMMUNITY): Payer: Self-pay | Admitting: Oncology

## 2016-05-13 ENCOUNTER — Ambulatory Visit (HOSPITAL_COMMUNITY): Payer: Medicare Other

## 2016-05-13 ENCOUNTER — Encounter (HOSPITAL_BASED_OUTPATIENT_CLINIC_OR_DEPARTMENT_OTHER): Payer: Medicare Other

## 2016-05-13 ENCOUNTER — Ambulatory Visit (HOSPITAL_COMMUNITY): Payer: Medicare Other | Admitting: Oncology

## 2016-05-13 VITALS — BP 173/57 | HR 83 | Temp 98.3°F | Resp 18

## 2016-05-13 DIAGNOSIS — C180 Malignant neoplasm of cecum: Secondary | ICD-10-CM

## 2016-05-13 DIAGNOSIS — Z5111 Encounter for antineoplastic chemotherapy: Secondary | ICD-10-CM

## 2016-05-13 LAB — COMPREHENSIVE METABOLIC PANEL
ALBUMIN: 3.1 g/dL — AB (ref 3.5–5.0)
ALK PHOS: 45 U/L (ref 38–126)
ALT: 12 U/L — AB (ref 17–63)
AST: 16 U/L (ref 15–41)
Anion gap: 9 (ref 5–15)
BUN: 34 mg/dL — AB (ref 6–20)
CALCIUM: 8.2 mg/dL — AB (ref 8.9–10.3)
CO2: 29 mmol/L (ref 22–32)
CREATININE: 6.45 mg/dL — AB (ref 0.61–1.24)
Chloride: 97 mmol/L — ABNORMAL LOW (ref 101–111)
GFR calc Af Amer: 10 mL/min — ABNORMAL LOW (ref >60)
GFR calc non Af Amer: 8 mL/min — ABNORMAL LOW (ref >60)
GLUCOSE: 155 mg/dL — AB (ref 65–99)
Potassium: 3.6 mmol/L (ref 3.5–5.1)
SODIUM: 135 mmol/L (ref 135–145)
Total Bilirubin: 0.5 mg/dL (ref 0.3–1.2)
Total Protein: 6.3 g/dL — ABNORMAL LOW (ref 6.5–8.1)

## 2016-05-13 LAB — CBC WITH DIFFERENTIAL/PLATELET
BASOS PCT: 0 %
Basophils Absolute: 0 10*3/uL (ref 0.0–0.1)
EOS ABS: 0.3 10*3/uL (ref 0.0–0.7)
Eosinophils Relative: 6 %
HCT: 24.2 % — ABNORMAL LOW (ref 39.0–52.0)
HEMOGLOBIN: 7.9 g/dL — AB (ref 13.0–17.0)
LYMPHS ABS: 0.9 10*3/uL (ref 0.7–4.0)
Lymphocytes Relative: 17 %
MCH: 31.6 pg (ref 26.0–34.0)
MCHC: 32.6 g/dL (ref 30.0–36.0)
MCV: 96.8 fL (ref 78.0–100.0)
Monocytes Absolute: 0.4 10*3/uL (ref 0.1–1.0)
Monocytes Relative: 8 %
NEUTROS ABS: 3.4 10*3/uL (ref 1.7–7.7)
NEUTROS PCT: 69 %
Platelets: 266 10*3/uL (ref 150–400)
RBC: 2.5 MIL/uL — AB (ref 4.22–5.81)
RDW: 16.5 % — ABNORMAL HIGH (ref 11.5–15.5)
WBC: 4.9 10*3/uL (ref 4.0–10.5)

## 2016-05-13 LAB — MAGNESIUM: Magnesium: 2.2 mg/dL (ref 1.7–2.4)

## 2016-05-13 MED ORDER — LEUCOVORIN CALCIUM INJECTION 350 MG
400.0000 mg/m2 | Freq: Once | INTRAVENOUS | Status: AC
  Administered 2016-05-13: 972 mg via INTRAVENOUS
  Filled 2016-05-13: qty 25

## 2016-05-13 MED ORDER — SODIUM CHLORIDE 0.9 % IV SOLN
Freq: Once | INTRAVENOUS | Status: AC
  Administered 2016-05-13: 12:00:00 via INTRAVENOUS

## 2016-05-13 MED ORDER — SODIUM CHLORIDE 0.9% FLUSH: 10.0000 mL | INTRAVENOUS | Status: DC | PRN

## 2016-05-13 MED ORDER — PALONOSETRON HCL INJECTION 0.25 MG/5ML
INTRAVENOUS | Status: AC
  Filled 2016-05-13: qty 5

## 2016-05-13 MED ORDER — PALONOSETRON HCL INJECTION 0.25 MG/5ML
0.2500 mg | Freq: Once | INTRAVENOUS | Status: AC
  Administered 2016-05-13: 0.25 mg via INTRAVENOUS

## 2016-05-13 MED ORDER — ATROPINE SULFATE 1 MG/ML IJ SOLN
INTRAMUSCULAR | Status: AC
  Filled 2016-05-13: qty 1

## 2016-05-13 MED ORDER — ATROPINE SULFATE 1 MG/ML IJ SOLN
0.5000 mg | Freq: Once | INTRAMUSCULAR | Status: AC | PRN
  Administered 2016-05-13: 0.5 mg via INTRAVENOUS

## 2016-05-13 MED ORDER — DEXTROSE 5 % IV SOLN
90.0000 mg/m2 | Freq: Once | INTRAVENOUS | Status: AC
  Administered 2016-05-13: 220 mg via INTRAVENOUS
  Filled 2016-05-13: qty 11

## 2016-05-13 MED ORDER — DEXAMETHASONE SODIUM PHOSPHATE 100 MG/10ML IJ SOLN
10.0000 mg | Freq: Once | INTRAMUSCULAR | Status: AC
  Administered 2016-05-13: 10 mg via INTRAVENOUS
  Filled 2016-05-13: qty 1

## 2016-05-13 MED ORDER — SODIUM CHLORIDE 0.9 % IV SOLN
1500.0000 mg/m2 | INTRAVENOUS | Status: DC
  Administered 2016-05-13: 3650 mg via INTRAVENOUS
  Filled 2016-05-13: qty 73

## 2016-05-13 MED ORDER — FLUOROURACIL CHEMO INJECTION 2.5 GM/50ML
300.0000 mg/m2 | Freq: Once | INTRAVENOUS | Status: AC
  Administered 2016-05-13: 750 mg via INTRAVENOUS
  Filled 2016-05-13: qty 15

## 2016-05-13 NOTE — Assessment & Plan Note (Addendum)
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  Now on FOLFIRI + Erbitux  Oncology history updated.  Treatment plan built consisting of FOLFIRI + Erbitux with dose reductions as recommended for hemodialysis patient.  He changes his code status back to full code after discussion with his daughter.   Labs today: CBC diff, CMET, Magnesium, and CEA.  I personally reviewed and went over laboratory results with the patient.  The results are noted within this dictation. Laboratory work satisfies treatment criteria.  Hemoglobin is noted and therefore, he will return next week for repeat CBC and planned 1 unit PRBC on Wednesday.  Chart is reviewed. Recent hospitalization is noted requiring PRBC transfusion.  Patient reports diarrhea with first cycle of therapy.  He notes 8-10 times per day.  He notes that it has resolved and improved with typical antidiarrheal intervention.  If persistent, we will need to consider a dose reduction.  Return in 2 weeks for treatment and follow-up.

## 2016-05-13 NOTE — Patient Instructions (Addendum)
[facility] Discharge Instructions for Patients Receiving Chemotherapy   Beginning January 23rd 2017 lab work for the [facility] will be done in the  Main lab at [facility] on 1st floor. If you have a lab appointment with the [facility] please come in thru the  Main Entrance and check in at the main information desk   Today you received the following chemotherapy agents: Irinotecan, Fluorouracil, and leucovorin. Remember to come back Tuesday of next week to get blood work so that we can give you blood when you return on Wednesday. Please see your schedule for those dates and times.    If you develop nausea and vomiting, or diarrhea that is not controlled by your medication, call the clinic.  The clinic phone number is [phone]. Office hours are Monday-Friday 8:30am-5:00pm.  BELOW ARE SYMPTOMS THAT SHOULD BE REPORTED IMMEDIATELY:  *FEVER GREATER THAN 101.0 F  *CHILLS WITH OR WITHOUT FEVER  NAUSEA AND VOMITING THAT IS NOT CONTROLLED WITH YOUR NAUSEA MEDICATION  *UNUSUAL SHORTNESS OF BREATH  *UNUSUAL BRUISING OR BLEEDING  TENDERNESS IN MOUTH AND THROAT WITH OR WITHOUT PRESENCE OF ULCERS  *URINARY PROBLEMS  *BOWEL PROBLEMS  UNUSUAL RASH Items with * indicate a potential emergency and should be followed up as soon as possible. If you have an emergency after office hours please contact your primary care physician or go to the nearest emergency department.  Please call the clinic during office hours if you have any questions or concerns.   You may also contact the Patient Navigator at [phone] should you have any questions or need assistance in obtaining follow up care.      Resources For Cancer Patients and their Caregivers ? American Cancer Society: Can assist with transportation, wigs, general needs, runs Look Good Feel Better.        316-035-9236 ? Cancer Care: Provides financial assistance, online support groups, medication/co-pay  assistance.  1-800-813-HOPE 629-002-7091) ? [place] Assists [place] cancer patients and their families through emotional , educational and financial support.  507-600-5978 ? [name] DSS Where to apply for food stamps, Medicaid and utility assistance. 631-623-0480 ? [name]: Transportation to medical appointments. 740-286-4656 ? Social Security Administration: May apply for disability if have a Stage IV cancer. 608-499-5615 (229)090-7209 ? [employer], Disability and Transit Services: Assists with nutrition, care and transit needs. 2191711106

## 2016-05-13 NOTE — Progress Notes (Signed)
Patient tolerated infusion well.  No sign of distress.  VSS.  Patient understands to return next Tuesday for labs and type and screen and that he is to return Wednesday for blood.

## 2016-05-13 NOTE — Progress Notes (Signed)
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 He had multiple other tubular adenomas and an occasional tubulovillous adenomas seen. Cancer is K-ras mutation negative (wild type).      07/19/2014 Procedure    Colonoscopy      07/19/2014 Pathology Results    Right colon mass- invasive adenocarcinoma      07/25/2014 Surgery    Right hemicolectomy performed by Dr. Ladona Horns      07/25/2014 Pathology Results    mucinous adenocarcinoma, 6 cm in maximum dimension, arising from the cecum. positive for 19/25 nodes, +LVI, focal neural invasion, poorly differentiated with numerous extramural nodules, greater than 10      08/10/2014 Pathology Results    KRAS/NRAS mutation in negative.  No KRAS or NRAS mutations were detected in exon 2, 3, and 4.      08/29/2014 - 02/20/2015 Chemotherapy    FOLFOX adjusted for renal failure and on dialysis, 12 cycles      04/02/2015 Imaging    CT abdomen pelvis-interval resolution of previously described anterior abdominal wall abscess. Ventral abdominal wall wound is predominantly healed. Small fat containing ventral abdominal wall hernia. NED.      08/18/2015 Progression         08/18/2015 PET scan    Prominent recurrence with extensive retroperitoneal hypermetabolic adenopathy, extensive adenopathy and nodularity in the right perirenal space and tracking in the right peritoneum. Hypermetabolic mass along the bowel anastomotic site.       09/04/2015 -  Chemotherapy    FOLFOX chemotherapy reinitiated for recurrent disease.      11/28/2015 Imaging    CT abdomen and pelvis-persistent nodularity along the right pararenal space, reduction in retroperitoneal lymphadenopathy, persistent enlarged retroperitoneal lymph node adjacent to the right kidney, no evidence of disease progression or liver metastases.      03/11/2016 Miscellaneous    Transfer of medical oncology care to [facility] (from [facility])      03/11/2016 Code Status    DNR      03/24/2016 Imaging    CT CAP- Mild increase in abdominal retroperitoneal lymphadenopathy since previous study. New mild right hydronephrosis also noted. No significant change in retroperitoneal soft tissue nodularity in right pararenal space, consistent with metastatic disease      04/29/2016 Treatment Plan Change    Change in therapy      04/29/2016 -  Chemotherapy    FOLFIRI + Erbitux      05/04/2016 - 05/07/2016 Hospital Admission    Admission diagnosis: Acute GI blood loss Additional comments: 2 units of PRBCs administered      05/13/2016 Code Status    FULL CODE  He reports a recent hospitalization for progressive anemia requiring 2 units of PRBCs.  Chart is reviewed.  He notes diarrhea which may be chemotherapy-induced.  He note 10 loose stools per day that improved with antidiarrheal intervention.  Review of Systems  Constitutional: Negative.  Negative for chills, fever and weight loss.  HENT: Negative.   Eyes: Negative.   Respiratory: Negative.   Cardiovascular: Positive for leg swelling (Stable, chronic).  Gastrointestinal: Positive for  diarrhea. Negative for abdominal pain, blood in stool, constipation, melena, nausea and vomiting.  Genitourinary: Negative.  Negative for dysuria and hematuria.  Musculoskeletal: Negative.   Skin: Negative.   Neurological: Negative.   Endo/Heme/Allergies: Negative.   Psychiatric/Behavioral: Negative.     Past Medical History:  Diagnosis Date  . 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 Cancer ([place]) 07/2014   colon cancer surgery.  Finished Chemo 02/2015  . Chronic kidney disease   . Diabetes mellitus without complication ([place])   . DNR (do not resuscitate) 03/11/2016  . ESRD (end stage renal disease) ([place]) 03/11/2016  . GERD (gastroesophageal reflux disease)   . High cholesterol   . Hypertension   . Stroke [facility])    2003    Past Surgical History:  Procedure Laterality Date  . AMPUTATION TOE Left 2001  . CATARACT EXTRACTION W/PHACO Right 06/16/2015   Procedure: CATARACT EXTRACTION PHACO AND INTRAOCULAR LENS PLACEMENT RIGHT EYE CDE=54.69;  Surgeon: Williams Che, MD;  Location: [facility] ORS;  Service: Ophthalmology;  Laterality: Right;  . COLECTOMY  07/2014  . DIALYSIS FISTULA CREATION Right   . ESOPHAGOGASTRODUODENOSCOPY N/A 05/06/2016   Procedure: ESOPHAGOGASTRODUODENOSCOPY (EGD);  Surgeon: Rogene Houston, MD;  Location: [facility] ENDO SUITE;  Service: Endoscopy;  Laterality: N/A;  . toe amputation Right 2000  . UPPER EXTREMITY ANGIOGRAM Right 05/03/2016   Procedure: RIGHT UPPER EXTREMITY ANGIOGRAM OF ARTERIAL VENOUS FISTULA , BALLOON ANGIOPLASTY;  Surgeon: Vickie Epley, MD;  Location: [facility] ORS;  Service: Vascular;  Laterality: Right;  . YAG LASER APPLICATION Right 09/38/1829   Procedure: YAG LASER APPLICATION;  Surgeon: Williams Che, MD;  Location: [facility] ORS;  Service: Ophthalmology;  Laterality: Right;    History reviewed. No pertinent family history.  Social History   Social History  . Marital status: Married    Spouse name: N/A  . Number of children: N/A  . Years of education:  N/A   Occupational History  . [employer]     [employer]   Social History Main Topics  . Smoking status: Former Smoker    Packs/day: 0.50    Years: 15.00    Types: Cigarettes    Quit date: 03/12/1991  . Smokeless tobacco: Never Used  . Alcohol use 3.0 oz/week    5 Shots of liquor per week  . Drug use: No  . Sexual activity: Not Asked   Other Topics Concern  . None   Social History Narrative  . None     PHYSICAL EXAMINATION  ECOG PERFORMANCE STATUS: 2 - Symptomatic, <50% confined to bed  There were no vitals filed for this visit.  BP 173/57 P 83 T 98.3 R 18  GENERAL:alert, no distress, well nourished, well developed, comfortable, cooperative, obese, smiling and in chemo-bed eating a hotdog, unaccompanied during exam. SKIN: skin color, texture, turgor are normal, no rashes or significant lesions HEAD: Normocephalic, No masses, lesions, tenderness or abnormalities EYES: normal EARS: External ears normal OROPHARYNX:mucous membranes are moist  NECK: supple, trachea midline LYMPH:  no palpable lymphadenopathy BREAST:not examined  LUNGS: clear to auscultation  HEART: regular rate & rhythm ABDOMEN:abdomen soft and normal bowel sounds BACK: Back symmetric, no curvature. EXTREMITIES:less then 2 second capillary refill, no joint deformities, effusion, or inflammation, no skin discoloration, no cyanosis  NEURO: alert & oriented x 3 with fluent speech, no focal motor/sensory deficits, brought to the clinic in a wheelchair.   LABORATORY DATA: CBC    Component Value Date/Time   WBC 4.9 05/13/2016 1125   RBC 2.50 (L) 05/13/2016 1125   HGB 7.9 (L) 05/13/2016 1125   HCT 24.2 (L) 05/13/2016 1125   PLT 266 05/13/2016 1125   MCV 96.8 05/13/2016 1125   MCH 31.6 05/13/2016 1125   MCHC 32.6 05/13/2016 1125   RDW 16.5 (H) 05/13/2016 1125   LYMPHSABS 0.9 05/13/2016 1125   MONOABS 0.4 05/13/2016 1125   EOSABS 0.3 05/13/2016 1125   BASOSABS 0.0 05/13/2016 1125       Chemistry      Component Value Date/Time   NA 135 05/13/2016 1125   K 3.6 05/13/2016 1125   CL 97 (L) 05/13/2016 1125   CO2 29 05/13/2016 1125   BUN 34 (H) 05/13/2016 1125   CREATININE 6.45 (H) 05/13/2016 1125      Component Value Date/Time   CALCIUM 8.2 (L) 05/13/2016 1125   ALKPHOS 45 05/13/2016 1125   AST 16 05/13/2016 1125   ALT 12 (L) 05/13/2016 1125   BILITOT 0.5 05/13/2016 1125        PENDING LABS:   RADIOGRAPHIC STUDIES:  Chest 2 View  Result Date: 05/03/2016 CLINICAL DATA:  Preop fistula declot EXAM: CHEST  2 VIEW COMPARISON:  03/24/2016 FINDINGS: There is a vascular stent identified along the course of the right subclavian vein. Left chest wall port a catheter is noted with tip in the projection of the cavoatrial junction. Normal heart size. Linear scar like densities noted in the left midlung and right base. IMPRESSION: 1. No acute cardiopulmonary abnormalities. Electronically Signed   By: Kerby Moors M.D.   On: 05/03/2016 09:30   [place] Upper Ext Art Right Ltd  Result Date: 05/02/2016 CLINICAL DATA:  60 year old male with bilateral shoulder pain and inability to access right upper arm hemodialysis access EXAM: RIGHT UPPER EXTREMITY ARTERIAL DUPLEX SCAN TECHNIQUE: Gray-scale sonography as well as color Doppler and duplex ultrasound was performed to evaluate the arteries of the upper extremity. COMPARISON:  None. FINDINGS: Sonographic interrogation of the brachial artery to basilic vein arteriovenous graft. The arterial anastomosis is widely patent. There is elevation of the peak systolic velocity to 119 centimeters/second compared to 123 centimeters/second in the brachial artery proximally. The graft demonstrates partial peripheral calcification resulting in shadowing on some images. There is evidence of a narrowing at the venous anastomosis with a significantly elevated peak systolic velocity at 147 centimeters/second, diffuse spectral broadening and aliasing. This is  consistent with a hemodynamically significant stenosis. At a point labeled as the access site there is peripheral heterogeneous echogenicity surrounding the graft material likely representing old hematoma. Additionally, there is some a documented elevation of the peak systolic velocity at the apex of the loop graft. This may be artifactual and related to the curvature of the graft. IMPRESSION: 1. Patent right brachiobasilic arteriovenous loop graft. 2. 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 3. Also suspect a more moderate stenosis at the arterial anastomosis. 4. Old hematoma surrounds the graft in the region of the 1 of the cannulation sites. Signed, Criselda Peaches, MD [facility] Radiology Electronically  Signed   By: Jacqulynn Cadet M.D.   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  Now on FOLFIRI + Erbitux  Oncology history updated.  Treatment plan built consisting of FOLFIRI + Erbitux with dose reductions as recommended for hemodialysis patient.  He changes his code status back to full code after discussion with his daughter.   Labs today: CBC diff, CMET, Magnesium, and CEA.  I personally reviewed and went over laboratory results with the patient.  The results are noted within this dictation. Laboratory work satisfies treatment criteria.  Hemoglobin is noted and therefore, he will return next week for repeat CBC and planned 1 unit PRBC on Wednesday.  Chart is reviewed. Recent hospitalization is noted requiring PRBC transfusion.  Patient reports diarrhea with first cycle of therapy.  He notes 8-10 times per day.  He notes that it has resolved and improved with typical  antidiarrheal intervention.  If persistent, we will need to consider a dose reduction.  Return in 2 weeks for treatment and follow-up.   ORDERS PLACED FOR THIS ENCOUNTER: Orders Placed This Encounter  Procedures  . CBC  . Practitioner attestation of consent  . Complete patient signature process for consent form  . Care order/instruction  . Full code  . Type and screen    MEDICATIONS PRESCRIBED THIS ENCOUNTER: No orders of the defined types were placed in this encounter.   THERAPY PLAN:  Continue palliative treatment as outlined above.  All questions were answered. The patient knows to call the clinic with any problems, questions or concerns. We can certainly see the patient much sooner if necessary.  Patient and plan discussed with Dr. Ancil Linsey and she is in agreement with the aforementioned.   This note is electronically signed by: Doy Mince 05/13/2016 7:06 PM

## 2016-05-14 ENCOUNTER — Encounter (HOSPITAL_BASED_OUTPATIENT_CLINIC_OR_DEPARTMENT_OTHER): Payer: Medicare Other

## 2016-05-14 ENCOUNTER — Encounter (HOSPITAL_COMMUNITY): Payer: Medicare Other

## 2016-05-14 DIAGNOSIS — C180 Malignant neoplasm of cecum: Secondary | ICD-10-CM

## 2016-05-14 LAB — CEA: CEA: 135.5 ng/mL — ABNORMAL HIGH (ref 0.0–4.7)

## 2016-05-14 MED ORDER — HEPARIN SOD (PORK) LOCK FLUSH 100 UNIT/ML IV SOLN
500.0000 [IU] | Freq: Once | INTRAVENOUS | Status: AC | PRN
  Administered 2016-05-14: 500 [IU]
  Filled 2016-05-14: qty 5

## 2016-05-14 MED ORDER — SODIUM CHLORIDE 0.9% FLUSH
10.0000 mL | INTRAVENOUS | Status: DC | PRN
  Administered 2016-05-14: 10 mL
  Filled 2016-05-14: qty 10

## 2016-05-14 MED ORDER — DIPHENOXYLATE-ATROPINE 2.5-0.025 MG PO TABS: 1.0000 | ORAL_TABLET | Freq: Four times a day (QID) | ORAL | 0 refills | Status: DC | PRN

## 2016-05-14 NOTE — Patient Instructions (Signed)
[place] at [facility] Discharge Instructions  RECOMMENDATIONS MADE BY THE CONSULTANT AND ANY TEST RESULTS WILL BE SENT TO YOUR REFERRING PHYSICIAN.  Pump removal and port flush today. Return as scheduled for chemotherapy and office visit.  Thank you for choosing [place] at [facility] to provide your oncology and hematology care.  To afford each patient quality time with our provider, please arrive at least 15 minutes before your scheduled appointment time.   Beginning January 23rd 2017 lab work for the [name] will be done in the  Main lab at [facility] on 1st floor. If you have a lab appointment with the [place] please come in thru the  Main Entrance and check in at the main information desk  You need to re-schedule your appointment should you arrive 10 or more minutes late.  We strive to give you quality time with our providers, and arriving late affects you and other patients whose appointments are after yours.  Also, if you no show three or more times for appointments you may be dismissed from the clinic at the providers discretion.     Again, thank you for choosing [facility].  Our hope is that these requests will decrease the amount of time that you wait before being seen by our physicians.       _____________________________________________________________  Should you have questions after your visit to [facility], please contact our office at [phone] between the hours of 8:30 a.m. and 4:30 p.m.  Voicemails left after 4:30 p.m. will not be returned until the following business day.  For prescription refill requests, have your pharmacy contact our office.         Resources For Cancer Patients and their Caregivers ? American Cancer Society: Can assist with transportation, wigs, general needs, runs Look Good Feel Better.        [phone] ? Cancer Care: Provides financial  assistance, online support groups, medication/co-pay assistance.  1-800-813-HOPE 4305661050) ? [place] Assists [place] cancer patients and their families through emotional , educational and financial support.  480-841-8800 ? [name] DSS Where to apply for food stamps, Medicaid and utility assistance. (272)351-9845 ? [name]: Transportation to medical appointments. 305-187-1767 ? Social Security Administration: May apply for disability if have a Stage IV cancer. 918-226-9629 (989) 639-3610 ? [employer], Disability and Transit Services: Assists with nutrition, care and transit needs. [place] Support Programs: @10RELATIVEDAYS @ > Cancer Support Group  2nd Tuesday of the month 1pm-2pm, [name]  > Creative Journey  3rd Tuesday of the month 1130am-1pm, [name]  > Look Good Feel Better  1st Wednesday of the month 10am-12 noon, [name] (Call [place] to register [phone])

## 2016-05-14 NOTE — Progress Notes (Signed)
Nathaniel Johnston presents to have home infusion pump d/c'd and for port-a-cath deaccess with flush. Portacath located left chest wall accessed with  H 20 needle.  Good blood return present. Portacath flushed with NS 10 ml and 500U/55ml Heparin, and needle removed intact.  Procedure tolerated well and without incident.

## 2016-05-18 ENCOUNTER — Encounter (HOSPITAL_COMMUNITY): Payer: Medicare Other

## 2016-05-18 DIAGNOSIS — C180 Malignant neoplasm of cecum: Secondary | ICD-10-CM

## 2016-05-18 LAB — CBC
HCT: 24 % — ABNORMAL LOW (ref 39.0–52.0)
HEMOGLOBIN: 7.8 g/dL — AB (ref 13.0–17.0)
MCH: 31 pg (ref 26.0–34.0)
MCHC: 32.5 g/dL (ref 30.0–36.0)
MCV: 95.2 fL (ref 78.0–100.0)
PLATELETS: 222 10*3/uL (ref 150–400)
RBC: 2.52 MIL/uL — AB (ref 4.22–5.81)
RDW: 16.5 % — ABNORMAL HIGH (ref 11.5–15.5)
WBC: 2.9 10*3/uL — ABNORMAL LOW (ref 4.0–10.5)

## 2016-05-18 LAB — PREPARE RBC (CROSSMATCH)

## 2016-05-18 LAB — SAMPLE TO BLOOD BANK

## 2016-05-19 ENCOUNTER — Encounter (HOSPITAL_BASED_OUTPATIENT_CLINIC_OR_DEPARTMENT_OTHER): Payer: Medicare Other

## 2016-05-19 ENCOUNTER — Encounter (HOSPITAL_COMMUNITY): Payer: Self-pay

## 2016-05-19 VITALS — BP 148/61 | HR 77 | Temp 98.3°F | Resp 16

## 2016-05-19 DIAGNOSIS — C180 Malignant neoplasm of cecum: Secondary | ICD-10-CM

## 2016-05-19 DIAGNOSIS — D649 Anemia, unspecified: Secondary | ICD-10-CM | POA: Diagnosis not present

## 2016-05-19 DIAGNOSIS — D62 Acute posthemorrhagic anemia: Secondary | ICD-10-CM

## 2016-05-19 MED ORDER — DIPHENHYDRAMINE HCL 25 MG PO CAPS
ORAL_CAPSULE | ORAL | Status: AC
  Filled 2016-05-19: qty 1

## 2016-05-19 MED ORDER — DIPHENHYDRAMINE HCL 50 MG/ML IJ SOLN: 25.0000 mg | Freq: Once | INTRAMUSCULAR | Status: DC

## 2016-05-19 MED ORDER — ACETAMINOPHEN 325 MG PO TABS
ORAL_TABLET | ORAL | Status: AC
  Filled 2016-05-19: qty 2

## 2016-05-19 MED ORDER — HEPARIN SOD (PORK) LOCK FLUSH 100 UNIT/ML IV SOLN
500.0000 [IU] | Freq: Every day | INTRAVENOUS | Status: AC | PRN
  Administered 2016-05-19: 500 [IU]

## 2016-05-19 MED ORDER — DIPHENHYDRAMINE HCL 25 MG PO TABS
25.0000 mg | ORAL_TABLET | Freq: Once | ORAL | Status: DC
  Filled 2016-05-19: qty 1

## 2016-05-19 MED ORDER — SODIUM CHLORIDE 0.9% FLUSH
10.0000 mL | INTRAVENOUS | Status: AC | PRN
  Administered 2016-05-19: 10 mL

## 2016-05-19 MED ORDER — SODIUM CHLORIDE 0.9 % IV SOLN
250.0000 mL | Freq: Once | INTRAVENOUS | Status: AC
  Administered 2016-05-19: 250 mL via INTRAVENOUS

## 2016-05-19 MED ORDER — ACETAMINOPHEN 325 MG PO TABS
650.0000 mg | ORAL_TABLET | Freq: Once | ORAL | Status: AC
  Administered 2016-05-19: 650 mg via ORAL

## 2016-05-19 NOTE — Patient Instructions (Signed)
[place] at [facility] Discharge Instructions  RECOMMENDATIONS MADE BY THE CONSULTANT AND ANY TEST RESULTS WILL BE SENT TO YOUR REFERRING PHYSICIAN.  You were given Blood today.  Keep scheduled appointment.   Thank you for choosing [place] at [facility] to provide your oncology and hematology care.  To afford each patient quality time with our provider, please arrive at least 15 minutes before your scheduled appointment time.   Beginning January 23rd 2017 lab work for the [name] will be done in the  Main lab at [facility] on 1st floor. If you have a lab appointment with the [place] please come in thru the  Main Entrance and check in at the main information desk  You need to re-schedule your appointment should you arrive 10 or more minutes late.  We strive to give you quality time with our providers, and arriving late affects you and other patients whose appointments are after yours.  Also, if you no show three or more times for appointments you may be dismissed from the clinic at the providers discretion.     Again, thank you for choosing [facility].  Our hope is that these requests will decrease the amount of time that you wait before being seen by our physicians.       _____________________________________________________________  Should you have questions after your visit to [facility], please contact our office at [phone] between the hours of 8:30 a.m. and 4:30 p.m.  Voicemails left after 4:30 p.m. will not be returned until the following business day.  For prescription refill requests, have your pharmacy contact our office.         Resources For Cancer Patients and their Caregivers ? American Cancer Society: Can assist with transportation, wigs, general needs, runs Look Good Feel Better.        763-809-9476 ? Cancer Care: Provides financial assistance, online support groups,  medication/co-pay assistance.  1-800-813-HOPE 905-587-2311) ? [place] Assists [place] cancer patients and their families through emotional , educational and financial support.  586-539-2308 ? [name] DSS Where to apply for food stamps, Medicaid and utility assistance. (904) 003-5736 ? [name]: Transportation to medical appointments. (365)391-3841 ? Social Security Administration: May apply for disability if have a Stage IV cancer. 812-689-7093 409-501-0506 ? [employer], Disability and Transit Services: Assists with nutrition, care and transit needs. [place] Support Programs: @10RELATIVEDAYS @ > Cancer Support Group  2nd Tuesday of the month 1pm-2pm, [name]  > Creative Journey  3rd Tuesday of the month 1130am-1pm, [name]  > Look Good Feel Better  1st Wednesday of the month 10am-12 noon, [name] (Call [place] to register [phone])

## 2016-05-19 NOTE — Progress Notes (Signed)
Pt given 1 unit of blood today. Pt tolerated well. VS stable, no reaction noted. Pt D/C home.

## 2016-05-20 LAB — TYPE AND SCREEN
ABO/RH(D): A POS
ANTIBODY SCREEN: NEGATIVE
UNIT DIVISION: 0

## 2016-05-27 ENCOUNTER — Other Ambulatory Visit (HOSPITAL_COMMUNITY): Payer: Self-pay | Admitting: Oncology

## 2016-05-27 ENCOUNTER — Encounter (HOSPITAL_BASED_OUTPATIENT_CLINIC_OR_DEPARTMENT_OTHER): Payer: Medicare Other

## 2016-05-27 ENCOUNTER — Encounter (HOSPITAL_BASED_OUTPATIENT_CLINIC_OR_DEPARTMENT_OTHER): Payer: Medicare Other | Admitting: Hematology & Oncology

## 2016-05-27 VITALS — BP 176/94 | HR 80 | Temp 98.5°F | Resp 18 | Wt 256.3 lb

## 2016-05-27 DIAGNOSIS — Z5112 Encounter for antineoplastic immunotherapy: Secondary | ICD-10-CM

## 2016-05-27 DIAGNOSIS — C180 Malignant neoplasm of cecum: Secondary | ICD-10-CM | POA: Diagnosis not present

## 2016-05-27 DIAGNOSIS — Z5111 Encounter for antineoplastic chemotherapy: Secondary | ICD-10-CM

## 2016-05-27 DIAGNOSIS — N186 End stage renal disease: Secondary | ICD-10-CM | POA: Diagnosis not present

## 2016-05-27 DIAGNOSIS — C189 Malignant neoplasm of colon, unspecified: Secondary | ICD-10-CM

## 2016-05-27 DIAGNOSIS — Z66 Do not resuscitate: Secondary | ICD-10-CM

## 2016-05-27 DIAGNOSIS — K521 Toxic gastroenteritis and colitis: Secondary | ICD-10-CM | POA: Diagnosis not present

## 2016-05-27 DIAGNOSIS — C772 Secondary and unspecified malignant neoplasm of intra-abdominal lymph nodes: Secondary | ICD-10-CM

## 2016-05-27 DIAGNOSIS — T451X5A Adverse effect of antineoplastic and immunosuppressive drugs, initial encounter: Secondary | ICD-10-CM

## 2016-05-27 DIAGNOSIS — Z992 Dependence on renal dialysis: Secondary | ICD-10-CM

## 2016-05-27 LAB — CBC WITH DIFFERENTIAL/PLATELET
BASOS ABS: 0 10*3/uL (ref 0.0–0.1)
BASOS PCT: 0 %
Eosinophils Absolute: 0.2 10*3/uL (ref 0.0–0.7)
Eosinophils Relative: 7 %
HEMATOCRIT: 27.3 % — AB (ref 39.0–52.0)
HEMOGLOBIN: 8.7 g/dL — AB (ref 13.0–17.0)
Lymphocytes Relative: 33 %
Lymphs Abs: 1.2 10*3/uL (ref 0.7–4.0)
MCH: 30.1 pg (ref 26.0–34.0)
MCHC: 31.9 g/dL (ref 30.0–36.0)
MCV: 94.5 fL (ref 78.0–100.0)
Monocytes Absolute: 0.4 10*3/uL (ref 0.1–1.0)
Monocytes Relative: 11 %
NEUTROS ABS: 1.7 10*3/uL (ref 1.7–7.7)
NEUTROS PCT: 49 %
Platelets: 262 10*3/uL (ref 150–400)
RBC: 2.89 MIL/uL — ABNORMAL LOW (ref 4.22–5.81)
RDW: 16.5 % — ABNORMAL HIGH (ref 11.5–15.5)
WBC: 3.4 10*3/uL — ABNORMAL LOW (ref 4.0–10.5)

## 2016-05-27 LAB — COMPREHENSIVE METABOLIC PANEL
ALBUMIN: 3.2 g/dL — AB (ref 3.5–5.0)
ALK PHOS: 59 U/L (ref 38–126)
ALT: 11 U/L — AB (ref 17–63)
AST: 14 U/L — AB (ref 15–41)
Anion gap: 7 (ref 5–15)
BILIRUBIN TOTAL: 0.4 mg/dL (ref 0.3–1.2)
BUN: 19 mg/dL (ref 6–20)
CALCIUM: 8.7 mg/dL — AB (ref 8.9–10.3)
CO2: 31 mmol/L (ref 22–32)
Chloride: 101 mmol/L (ref 101–111)
Creatinine, Ser: 4.13 mg/dL — ABNORMAL HIGH (ref 0.61–1.24)
GFR calc Af Amer: 17 mL/min — ABNORMAL LOW (ref >60)
GFR calc non Af Amer: 14 mL/min — ABNORMAL LOW (ref >60)
GLUCOSE: 81 mg/dL (ref 65–99)
Potassium: 3.4 mmol/L — ABNORMAL LOW (ref 3.5–5.1)
Sodium: 139 mmol/L (ref 135–145)
TOTAL PROTEIN: 6.3 g/dL — AB (ref 6.5–8.1)

## 2016-05-27 LAB — MAGNESIUM: Magnesium: 1.8 mg/dL (ref 1.7–2.4)

## 2016-05-27 MED ORDER — DIPHENHYDRAMINE HCL 50 MG/ML IJ SOLN
50.0000 mg | Freq: Once | INTRAMUSCULAR | Status: AC
  Administered 2016-05-27: 50 mg via INTRAVENOUS
  Filled 2016-05-27: qty 1

## 2016-05-27 MED ORDER — DEXAMETHASONE SODIUM PHOSPHATE 100 MG/10ML IJ SOLN
10.0000 mg | Freq: Once | INTRAMUSCULAR | Status: AC
  Administered 2016-05-27: 10 mg via INTRAVENOUS
  Filled 2016-05-27: qty 1

## 2016-05-27 MED ORDER — FLUOROURACIL CHEMO INJECTION 2.5 GM/50ML
300.0000 mg/m2 | Freq: Once | INTRAVENOUS | Status: AC
  Administered 2016-05-27: 750 mg via INTRAVENOUS
  Filled 2016-05-27: qty 15

## 2016-05-27 MED ORDER — ATROPINE SULFATE 1 MG/ML IJ SOLN
0.5000 mg | Freq: Once | INTRAMUSCULAR | Status: AC | PRN
  Administered 2016-05-27: 0.5 mg via INTRAVENOUS
  Filled 2016-05-27: qty 1

## 2016-05-27 MED ORDER — SODIUM CHLORIDE 0.9 % IV SOLN
Freq: Once | INTRAVENOUS | Status: AC
  Administered 2016-05-27: 14:00:00 via INTRAVENOUS

## 2016-05-27 MED ORDER — CETUXIMAB CHEMO IV INJECTION 200 MG/100ML
500.0000 mg/m2 | Freq: Once | INTRAVENOUS | Status: AC
  Administered 2016-05-27: 1200 mg via INTRAVENOUS
  Filled 2016-05-27: qty 600

## 2016-05-27 MED ORDER — SODIUM CHLORIDE 0.9% FLUSH: 10.0000 mL | INTRAVENOUS | Status: DC | PRN

## 2016-05-27 MED ORDER — DEXTROSE 5 % IV SOLN
90.0000 mg/m2 | Freq: Once | INTRAVENOUS | Status: AC
  Administered 2016-05-27: 220 mg via INTRAVENOUS
  Filled 2016-05-27: qty 11

## 2016-05-27 MED ORDER — PALONOSETRON HCL INJECTION 0.25 MG/5ML
0.2500 mg | Freq: Once | INTRAVENOUS | Status: AC
  Administered 2016-05-27: 0.25 mg via INTRAVENOUS
  Filled 2016-05-27: qty 5

## 2016-05-27 MED ORDER — LEUCOVORIN CALCIUM INJECTION 350 MG
400.0000 mg/m2 | Freq: Once | INTRAVENOUS | Status: AC
  Administered 2016-05-27: 972 mg via INTRAVENOUS
  Filled 2016-05-27: qty 48.6

## 2016-05-27 MED ORDER — SODIUM CHLORIDE 0.9 % IV SOLN
Freq: Once | INTRAVENOUS | Status: AC
  Administered 2016-05-27: 12:00:00 via INTRAVENOUS

## 2016-05-27 MED ORDER — SODIUM CHLORIDE 0.9 % IV SOLN
1500.0000 mg/m2 | INTRAVENOUS | Status: DC
  Administered 2016-05-27: 3650 mg via INTRAVENOUS
  Filled 2016-05-27: qty 73

## 2016-05-27 NOTE — Progress Notes (Signed)
[facility] Hematology/Oncology Progress Note  Name: Nathaniel Johnston      MRN: [id number]     Date: 05/27/2016 Time:12:27 PM   REFERRING PHYSICIAN:  Everardo All, MD ([place])  REASON FOR CONSULT:  Transfer of medical oncology care.   DIAGNOSIS:  Stage IV adenocarcinoma of colon  HISTORY OF PRESENT ILLNESS:   Nathaniel Johnston is a 60 y.o. 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    Adenocarcinoma of cecum ([place])   06/19/2014 Pathology Results    Biopsy of right colon mass positive for adenocarcinoma. He had multiple other tubular adenomas and an occasional tubulovillous adenomas seen. Cancer is K-ras mutation negative (wild type).      07/19/2014 Procedure    Colonoscopy      07/19/2014 Pathology Results    Right colon mass- invasive adenocarcinoma      07/25/2014 Surgery    Right hemicolectomy performed by Dr. Ladona Horns      07/25/2014 Pathology Results    mucinous adenocarcinoma, 6 cm in maximum dimension, arising from the cecum. positive for 19/25 nodes, +LVI, focal neural invasion, poorly differentiated with numerous extramural nodules, greater than 10      08/10/2014 Pathology Results    KRAS/NRAS mutation in negative.  No KRAS or NRAS mutations were detected in exon 2, 3, and 4.      08/29/2014 - 02/20/2015 Chemotherapy    FOLFOX adjusted for renal failure and on dialysis, 12 cycles      04/02/2015 Imaging    CT abdomen pelvis-interval resolution of previously described anterior abdominal wall abscess. Ventral abdominal wall wound is predominantly healed. Small fat containing ventral abdominal wall hernia. NED.      08/18/2015 Progression         08/18/2015 PET scan    Prominent recurrence with extensive  retroperitoneal hypermetabolic adenopathy, extensive adenopathy and nodularity in the right perirenal space and tracking in the right peritoneum. Hypermetabolic mass along the bowel anastomotic site.       09/04/2015 -  Chemotherapy    FOLFOX chemotherapy reinitiated for recurrent disease.      11/28/2015 Imaging    CT abdomen and pelvis-persistent nodularity along the right pararenal space, reduction in retroperitoneal lymphadenopathy, persistent enlarged retroperitoneal lymph node adjacent to the right kidney, no evidence of disease progression or liver metastases.      03/11/2016 Miscellaneous    Transfer of medical oncology care to [facility] (from [facility])      03/11/2016 Code Status    DNR      03/24/2016 Imaging    CT CAP- Mild increase in abdominal retroperitoneal lymphadenopathy since previous study. New mild right hydronephrosis also noted. No significant change in retroperitoneal soft tissue nodularity in right pararenal space, consistent with metastatic disease      04/29/2016 Treatment Plan Change    Change in therapy      04/29/2016 -  Chemotherapy    FOLFIRI + Erbitux      05/04/2016 - 05/07/2016 Hospital Admission    Admission diagnosis: Acute GI blood loss Additional comments: 2 units of PRBCs administered      05/13/2016 Code Status    FULL CODE       Chart is reviewed in  detail.  In summary, the patient was diagnosed with a Stage IIIC  In 2015.  He was treated definitively with surgery followed by adjuvant chemotherapy consisting of FOLFOX x 6 months.  Unfortunately, he had high-risk disease and recurrence was noted on PET imaging on 08/13/2015 when his CEA climbed.  He was therefore rechallenged with FOLFOX and did well until imaging on 6/28 showed progression of disease. He is currently on FOLFIRI/Erbitux.   He denies any complaints today.  He reports that dialysis is going well.  Patient is on Cycle #3 FOLFIRI and is  unaccompanied in treatment bed.   He says he is doing okay with treatment but he feels like treatment is much stronger this time.   He did have diarrhea which he notes started after his 5FU pump was removed. He notes it took a lot of imodium (3 to 4) to stop it. He is adamant however that it stopped. It only lasted a day or two and did not come back.   Patient says diarrhea is worse when he gets chemo but then it gets better over the week. He denies any diarrhea the week after chemotherapy.   He states that he has a good appetite. He also states that sometimes he sleeps well and sometimes he doesn't.   Patient denies abdominal pain.   He also denies a rash.    Review of Systems  Constitutional: Negative for chills, fever and weight loss.  HENT: Negative for sore throat.   Eyes: Negative for blurred vision and double vision.  Respiratory: Negative for cough, hemoptysis, sputum production, shortness of breath and wheezing.   Cardiovascular: Negative for chest pain and palpitations.  Gastrointestinal: Positive for diarrhea. Negative for abdominal pain, blood in stool, constipation, melena, nausea and vomiting.       Diarrhea managed with imodium and lomotil   Genitourinary: Negative for dysuria, frequency, hematuria and urgency.  Musculoskeletal: Negative for falls and myalgias.  Skin: Negative for itching and rash.  Neurological: Negative for dizziness and headaches.  Endo/Heme/Allergies: Does not bruise/bleed easily.  Psychiatric/Behavioral: Negative.   14 point review of systems was performed and is negative except as detailed under history of present illness and above    PAST MEDICAL HISTORY:   Past Medical History:  Diagnosis Date  . Adenocarcinoma of cecum ([place]) 03/08/2016  . Cancer ([place]) 07/2014   colon cancer surgery.  Finished Chemo 02/2015  . Chronic kidney disease   . Diabetes mellitus without complication ([place])   . DNR (do not resuscitate) 03/11/2016  . ESRD (end stage  renal disease) ([place]) 03/11/2016  . GERD (gastroesophageal reflux disease)   . High cholesterol   . Hypertension   . Stroke [facility])    2003    ALLERGIES: No Known Allergies    MEDICATIONS: I have reviewed the patient's current medications.    Current Outpatient Prescriptions on File Prior to Visit  Medication Sig Dispense Refill  . amLODipine (NORVASC) 10 MG tablet Take 2 tablets by mouth daily.     . calcium acetate (PHOSLO) 667 MG capsule Take 1 capsule by mouth 3 (three) times daily with meals.     . Cetuximab (ERBITUX IV) Inject into the vein every 14 (fourteen) days. Every 2 weeks     . dexamethasone (DECADRON) 4 MG tablet Take 2 tablets (8 mg total) by mouth daily. Start the day after chemo for 2 days. 8 tablet 5  . diphenoxylate-atropine (LOMOTIL) 2.5-0.025 MG tablet Take 1 tablet by mouth 4 (four)  times daily as needed for diarrhea or loose stools. 60 tablet 0  . fluorouracil CALGB 09604 in sodium chloride 0.9 % 150 mL Inject into the vein over 24 hr.    . glipiZIDE (GLUCOTROL) 10 MG tablet Take 1 tablet by mouth 2 (two) times daily before a meal.     . HYDROcodone-acetaminophen (NORCO/VICODIN) 5-325 MG tablet Take 1 tablet by mouth every 6 (six) hours as needed for moderate pain.     . irinotecan in dextrose 5 % 500 mL Inject into the vein every 14 (fourteen) days. Every 2 weeks     . Iron Polysacch Cmplx-B12-FA 150-0.025-1 MG CAPS Take 1 capsule by mouth daily.     Marland Kitchen labetalol (NORMODYNE) 300 MG tablet Take 300 mg by mouth 2 (two) times daily.     Marland Kitchen LEUCOVORIN CALCIUM IJ Inject as directed every 14 (fourteen) days.     Marland Kitchen lisinopril (PRINIVIL,ZESTRIL) 40 MG tablet Take 40 mg by mouth daily.     Marland Kitchen loperamide (IMODIUM) 2 MG capsule Take 2 capsules after the first loose stool and then 1 capsule every 2 hours until you go a total of 12 hours without having a loose stool. If it is bedtime and you are having loose stools take 2 capsules every 4 hours until morning. Call [place]. 90  capsule 0  . ondansetron (ZOFRAN) 8 MG tablet Take 1 tablet (8 mg total) by mouth 2 (two) times daily as needed for refractory nausea / vomiting. Start on day 3 after chemotherapy. 30 tablet 1  . potassium chloride SA (K-DUR,KLOR-CON) 20 MEQ tablet Take 1 tablet (20 mEq total) by mouth daily. 30 tablet 0  . sevelamer carbonate (RENVELA) 800 MG tablet Take 800 mg by mouth 3 (three) times daily with meals.     . simvastatin (ZOCOR) 80 MG tablet Take 1 tablet by mouth daily at 6 PM.     . Skin Protectants, Misc. (EUCERIN) cream Apply 1 application topically as needed for dry skin.     Marland Kitchen triamcinolone cream (KENALOG) 0.1 % Apply 1 application topically daily as needed (for skin irritation).      Current Facility-Administered Medications on File Prior to Visit  Medication Dose Route Frequency Provider Last Rate Last Dose  . 0.9 %  sodium chloride infusion   Intravenous Once Patrici Ranks, MD      . 0.9 %  sodium chloride infusion   Intravenous Once Patrici Ranks, MD      . atropine injection 0.5 mg  0.5 mg Intravenous Once PRN Patrici Ranks, MD      . cetuximab (ERBITUX) chemo infusion 1,200 mg  500 mg/m2 (Treatment Plan Recorded) Intravenous Once Patrici Ranks, MD      . dexamethasone (DECADRON) 10 mg in sodium chloride 0.9 % 50 mL IVPB  10 mg Intravenous Once Patrici Ranks, MD      . diphenhydrAMINE (BENADRYL) injection 50 mg  50 mg Intravenous Once Patrici Ranks, MD      . fluorouracil (ADRUCIL) 3,650 mg in sodium chloride 0.9 % 77 mL chemo infusion  1,500 mg/m2 (Treatment Plan Recorded) Intravenous over 24 hr Patrici Ranks, MD      . fluorouracil (ADRUCIL) chemo injection 750 mg  300 mg/m2 (Treatment Plan Recorded) Intravenous Once Patrici Ranks, MD      . irinotecan (CAMPTOSAR) 220 mg in dextrose 5 % 500 mL chemo infusion  90 mg/m2 (Treatment Plan Recorded) Intravenous Once Patrici Ranks, MD      .  leucovorin 972 mg in dextrose 5 % 250 mL infusion  400 mg/m2  (Treatment Plan Recorded) Intravenous Once Patrici Ranks, MD      . palonosetron (ALOXI) injection 0.25 mg  0.25 mg Intravenous Once Patrici Ranks, MD      . sodium chloride flush (NS) 0.9 % injection 10 mL  10 mL Intracatheter PRN Patrici Ranks, MD         PAST SURGICAL HISTORY Past Surgical History:  Procedure Laterality Date  . AMPUTATION TOE Left 2001  . CATARACT EXTRACTION W/PHACO Right 06/16/2015   Procedure: CATARACT EXTRACTION PHACO AND INTRAOCULAR LENS PLACEMENT RIGHT EYE CDE=54.69;  Surgeon: Williams Che, MD;  Location: [facility] ORS;  Service: Ophthalmology;  Laterality: Right;  . COLECTOMY  07/2014  . DIALYSIS FISTULA CREATION Right   . ESOPHAGOGASTRODUODENOSCOPY N/A 05/06/2016   Procedure: ESOPHAGOGASTRODUODENOSCOPY (EGD);  Surgeon: Rogene Houston, MD;  Location: [facility] ENDO SUITE;  Service: Endoscopy;  Laterality: N/A;  . toe amputation Right 2000  . UPPER EXTREMITY ANGIOGRAM Right 05/03/2016   Procedure: RIGHT UPPER EXTREMITY ANGIOGRAM OF ARTERIAL VENOUS FISTULA , BALLOON ANGIOPLASTY;  Surgeon: Vickie Epley, MD;  Location: [facility] ORS;  Service: Vascular;  Laterality: Right;  . YAG LASER APPLICATION Right 86/76/1950   Procedure: YAG LASER APPLICATION;  Surgeon: Williams Che, MD;  Location: [facility] ORS;  Service: Ophthalmology;  Laterality: Right;    FAMILY HISTORY: No family history on file.  He has a brother who lives nearby. He also has a sister His mother is living.  Her health is "good"  She is 65 yo Father is deceased in his 40's from unknown reason. He has a daughter and 2 grandbabies.  They live in [place] as she is married to a Nature conservation officer man.  SOCIAL HISTORY:  reports that he quit smoking about 25 years ago. His smoking use included Cigarettes. He has a 7.50 pack-year smoking history. He has never used smokeless tobacco. He reports that he does not drink alcohol or use drugs. He is a Engineer, manufacturing.  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   He retired with disability from 2 work injuries leading to bilateral amputation of #1 toe.  He ascertained disability in 2003.  Social History   Social History  . Marital status: Married    Spouse name: N/A  . Number of children: N/A  . Years of education: N/A   Occupational History  . Metal [employer]     [employer]   Social History Main Topics  . Smoking status: Former Smoker    Packs/day: 0.50    Years: 15.00    Types: Cigarettes    Quit date: 03/12/1991  . Smokeless tobacco: Never Used  . Alcohol use No  . Drug use: No  . Sexual activity: Not on file   Other Topics Concern  . Not on file   Social History Narrative  . No narrative on file    PERFORMANCE STATUS: The patient's performance status is 2 - Symptomatic, <50% confined to bed   PHYSICAL EXAM:  Most Recent Vital Signs: Vitals with BMI 05/27/2016  Height   Weight 256 lbs 5 oz  BMI   Systolic 932  Diastolic 74  Pulse 71  Respirations 18    General appearance: alert, appears older than stated age, no distress, moderately obese and in treatment bed Head: Normocephalic, without obvious abnormality, atraumatic Throat: normal findings: oropharynx pink & moist without lesions or evidence of thrush Neck:  supple, symmetrical, trachea midline Lungs: clear to auscultation bilaterally and normal percussion bilaterally Heart: regular rate and rhythm Extremities: edema B/L 1+ pitting edema pre-tibially without erythema or heat.  No tenderness to palpation.  Socks carry his cell phone and wallet. Skin: Skin color, texture, turgor normal. No rashes or lesions Neurologic: Grossly normal  LABORATORY DATA:  I have reviewed the data as listed below.  Results for orders placed or performed in visit on 05/27/16 (from the past 48 hour(s))  CBC with Differential     Status: Abnormal   Collection Time: 05/27/16 11:13 AM  Result Value Ref Range   WBC 3.4 (L) 4.0 - 10.5 K/uL   RBC 2.89 (L) 4.22 - 5.81 MIL/uL    Hemoglobin 8.7 (L) 13.0 - 17.0 g/dL   HCT 27.3 (L) 39.0 - 52.0 %   MCV 94.5 78.0 - 100.0 fL   MCH 30.1 26.0 - 34.0 pg   MCHC 31.9 30.0 - 36.0 g/dL   RDW 16.5 (H) 11.5 - 15.5 %   Platelets 262 150 - 400 K/uL   Neutrophils Relative % 49 %   Neutro Abs 1.7 1.7 - 7.7 K/uL   Lymphocytes Relative 33 %   Lymphs Abs 1.2 0.7 - 4.0 K/uL   Monocytes Relative 11 %   Monocytes Absolute 0.4 0.1 - 1.0 K/uL   Eosinophils Relative 7 %   Eosinophils Absolute 0.2 0.0 - 0.7 K/uL   Basophils Relative 0 %   Basophils Absolute 0.0 0.0 - 0.1 K/uL  Comprehensive metabolic panel     Status: Abnormal   Collection Time: 05/27/16 11:13 AM  Result Value Ref Range   Sodium 139 135 - 145 mmol/L   Potassium 3.4 (L) 3.5 - 5.1 mmol/L   Chloride 101 101 - 111 mmol/L   CO2 31 22 - 32 mmol/L   Glucose, Bld 81 65 - 99 mg/dL   BUN 19 6 - 20 mg/dL   Creatinine, Ser 4.13 (H) 0.61 - 1.24 mg/dL   Calcium 8.7 (L) 8.9 - 10.3 mg/dL   Total Protein 6.3 (L) 6.5 - 8.1 g/dL   Albumin 3.2 (L) 3.5 - 5.0 g/dL   AST 14 (L) 15 - 41 U/L   ALT 11 (L) 17 - 63 U/L   Alkaline Phosphatase 59 38 - 126 U/L   Total Bilirubin 0.4 0.3 - 1.2 mg/dL   GFR calc non Af Amer 14 (L) >60 mL/min   GFR calc Af Amer 17 (L) >60 mL/min    Comment: (NOTE) The eGFR has been calculated using the CKD EPI equation. This calculation has not been validated in all clinical situations. eGFR's persistently <60 mL/min signify possible Chronic Kidney Disease.    Anion gap 7 5 - 15  Magnesium     Status: None   Collection Time: 05/27/16 11:13 AM  Result Value Ref Range   Magnesium 1.8 1.7 - 2.4 mg/dL       Results for REGGIE, BISE (MRN [id number]) as of 05/31/2016 21:22  Ref. Range 04/01/2016 11:00 04/29/2016 11:24 05/13/2016 11:25  CEA Latest Ref Range: 0.0 - 4.7 ng/mL 187.1 (H) 220.4 (H) 135.5 (H)    RADIOGRAPHY: No results found.    I have reviewed the data as listed below.   Study Result     CLINICAL DATA: Followup metastatic adenocarcinoma of  cecum. Previous surgery and chemotherapy.  EXAM: CT CHEST, ABDOMEN, AND PELVIS WITH CONTRAST  TECHNIQUE: Multidetector CT imaging of the chest, abdomen and pelvis was performed following the  standard protocol during bolus administration of intravenous contrast.  CONTRAST: 186m ISOVUE-300 IOPAMIDOL (ISOVUE-300) INJECTION 61%  COMPARISON: [facility] CT on 11/28/2015 and PET-CT on 08/13/2015  FINDINGS: CT CHEST FINDINGS  Mediastinum/Lymph Nodes: 9 mm posterior paraesophageal lymph node is seen on image 29/series 2, which measures 11 mm on previous study. No other sites of lymphadenopathy identified within the thorax. Three-vessel coronary artery calcification noted.  Lungs/Pleura: No pulmonary mass, infiltrate, or effusion. Mild bilateral lower lobe scarring is stable. Azygos fissure again noted.  Musculoskeletal: No chest wall mass or suspicious bone lesions identified.  CT ABDOMEN PELVIS FINDINGS  Hepatobiliary: No liver masses are identified. Tiny calcified gallstones again seen, however there is no evidence of cholecystitis or biliary dilatation.  Pancreas: No mass, inflammatory changes, or other significant abnormality.  Spleen: Within normal limits in size and appearance.  Adrenals/Urinary Tract: No adrenal or renal masses are identified. Bilateral renal parenchymal atrophy again demonstrated as well as tiny right renal cysts. Mild right hydronephrosis is new since previous study. No ureteral calculi identified.  Soft tissue thickening and nodularity in the right perinephric space shows no significant interval change, and is consistent with retroperitoneal metastatic disease.  Stomach/Bowel: No evidence of obstruction, inflammatory process, or abnormal fluid collections. Postop changes from partial right colectomy again demonstrated. Severe diverticulosis is again seen involving the descending and sigmoid colon, however there is no evidence of  diverticulitis.  Vascular/Lymphatic: Abdominal retroperitoneal lymphadenopathy in the aorto-caval and left paraaortic regions is again demonstrated. Index lymph node in the aortocaval space measures 2.4 cm on image 69/series 2 compared to 1.3 cm previously. Index lymph node in the left paraaortic region measures 2.6 cm on image 77/series 2 compared to 1.9 cm previously. No pelvic lymphadenopathy identified.  No evidence of abdominal aortic aneurysm. Aortic atherosclerosis noted.  Reproductive: No mass or other significant abnormality.  Other: None.  Musculoskeletal: No suspicious bone lesions identified.  IMPRESSION: Mild increase in abdominal retroperitoneal lymphadenopathy since previous study. New mild right hydronephrosis also noted.  No significant change in retroperitoneal soft tissue nodularity in right pararenal space, consistent with metastatic disease.  Slight decrease in left mediastinal posterior paraesophageal lymph node compared to previous PET on 08/13/2015, currently measuring 9 mm compared to 11 mm previously.  Cholelithiasis. No radiographic evidence of cholecystitis.  Aortic atherosclerosis noted.  Colonic diverticulosis. No radiographic evidence of diverticulitis.   Electronically Signed  By: JEarle GellM.D.  On: 03/24/2016 14:02    PATHOLOGY:  N/A   ASSESSMENT/PLAN:   [place]den[place]um [place] Stage IV ESRD on Dialysis KRAS/NRAS WT  Stage IV adenocarcinoma of colon found on PET imaging on 08/13/2015 after being diagnosed with Stage IIIC ([id number]) on 07/25/2014 treated with definitive surgery followed by 6 months of adjuvant therapy consisting of FOLFOX. Patient was restarted on FOLFOX with dosing according to dialysis requirements. Progression of disease noted by rising CEA and on imaging from 03/24/2016. He is currently on FOLFIRI/ERBITUX.   Microsatellite status is unknown.   Will proceed forward with cycle #3  FOLFIRI/Erbitux. Unfortunately the patient did not receive cycle #2 of Erbitux secondary to error. He is currently receiving it Q2 weeks secondary to transportation issues.  We discussed toxicities in detail, diarrhea early and late, skin toxicities. I emphasized the importance of notifying u[place]if his diarrhea does not resolve with imodium or lomotil.   He does not need any refills at this time.   He will return for follow up in two weeks  All questions were answered. The patient  knows to call the clinic with any problems, questions or concerns. We can certainly see the patient much sooner if necessary.  This document serves as a record of services personally performed by Ancil Linsey, MD. It was created on her behalf by Elmyra Ricks, a trained medical scribe. The creation of this record is based on the scribe's personal observations and the provider's statements to them. This document has been checked and approved by the attending provider.  I have reviewed the above documentation for accuracy and completeness and I agree with the above.  This note is electronically signed by: Molli Hazard, MD  05/27/2016 12:27 PM

## 2016-05-27 NOTE — Progress Notes (Signed)
Tolerated tx w/o adverse reaction.  Alert, in no distress.  VSS.  Discharged via wheelchair in c/o brother for transport home.

## 2016-05-27 NOTE — Patient Instructions (Signed)
[facility] Discharge Instructions for Patients Receiving Chemotherapy   Beginning January 23rd 2017 lab work for the [facility] will be done in the  Main lab at [facility] on 1st floor. If you have a lab appointment with the [facility] please come in thru the  Main Entrance and check in at the main information desk   Today you received the following chemotherapy agents:  Erbitux, irinotecan, leucovorin, and 5FU.  If you develop nausea and vomiting, or diarrhea that is not controlled by your medication, call the clinic.  The clinic phone number is [phone]. Office hours are Monday-Friday 8:30am-5:00pm.  BELOW ARE SYMPTOMS THAT SHOULD BE REPORTED IMMEDIATELY:  *FEVER GREATER THAN 101.0 F  *CHILLS WITH OR WITHOUT FEVER  NAUSEA AND VOMITING THAT IS NOT CONTROLLED WITH YOUR NAUSEA MEDICATION  *UNUSUAL SHORTNESS OF BREATH  *UNUSUAL BRUISING OR BLEEDING  TENDERNESS IN MOUTH AND THROAT WITH OR WITHOUT PRESENCE OF ULCERS  *URINARY PROBLEMS  *BOWEL PROBLEMS  UNUSUAL RASH Items with * indicate a potential emergency and should be followed up as soon as possible. If you have an emergency after office hours please contact your primary care physician or go to the nearest emergency department.  Please call the clinic during office hours if you have any questions or concerns.   You may also contact the Patient Navigator at [phone] should you have any questions or need assistance in obtaining follow up care.      Resources For Cancer Patients and their Caregivers ? American Cancer Society: Can assist with transportation, wigs, general needs, runs Look Good Feel Better.        3016424839 ? Cancer Care: Provides financial assistance, online support groups, medication/co-pay assistance.  1-800-813-HOPE 951-193-1386) ? [place] Assists [place] cancer patients and their families through emotional , educational and financial  support.  (786)195-9322 ? [name] DSS Where to apply for food stamps, Medicaid and utility assistance. (878)470-2592 ? [name]: Transportation to medical appointments. 986 184 3804 ? Social Security Administration: May apply for disability if have a Stage IV cancer. (559)634-6522 8018655326 ? [employer], Disability and Transit Services: Assists with nutrition, care and transit needs. 878-566-1632

## 2016-05-27 NOTE — Patient Instructions (Signed)
[place] at [facility] Discharge Instructions  RECOMMENDATIONS MADE BY THE CONSULTANT AND ANY TEST RESULTS WILL BE SENT TO YOUR REFERRING PHYSICIAN.  You saw Dr. Whitney Muse today. Follow up in 2 weeks with labs and chemo.  Thank you for choosing [place] at [facility] to provide your oncology and hematology care.  To afford each patient quality time with our provider, please arrive at least 15 minutes before your scheduled appointment time.   Beginning January 23rd 2017 lab work for the [name] will be done in the  Main lab at [facility] on 1st floor. If you have a lab appointment with the [place] please come in thru the  Main Entrance and check in at the main information desk  You need to re-schedule your appointment should you arrive 10 or more minutes late.  We strive to give you quality time with our providers, and arriving late affects you and other patients whose appointments are after yours.  Also, if you no show three or more times for appointments you may be dismissed from the clinic at the providers discretion.     Again, thank you for choosing [facility].  Our hope is that these requests will decrease the amount of time that you wait before being seen by our physicians.       _____________________________________________________________  Should you have questions after your visit to [facility], please contact our office at [phone] between the hours of 8:30 a.m. and 4:30 p.m.  Voicemails left after 4:30 p.m. will not be returned until the following business day.  For prescription refill requests, have your pharmacy contact our office.         Resources For Cancer Patients and their Caregivers ? American Cancer Society: Can assist with transportation, wigs, general needs, runs Look Good Feel Better.        ([phone] ? Cancer Care: Provides financial assistance, online support  groups, medication/co-pay assistance.  1-800-813-HOPE (305) 189-2458) ? [place] Assists [place] cancer patients and their families through emotional , educational and financial support.  450-834-8315 ? [name] DSS Where to apply for food stamps, Medicaid and utility assistance. 346-588-4512 ? [name]: Transportation to medical appointments. (249)507-0014 ? Social Security Administration: May apply for disability if have a Stage IV cancer. [phone] 915-583-6999 ? [employer], Disability and Transit Services: Assists with nutrition, care and transit needs. [place] Support Programs: @10RELATIVEDAYS @ > Cancer Support Group  2nd Tuesday of the month 1pm-2pm, [name]  > Creative Journey  3rd Tuesday of the month 1130am-1pm, [name]  > Look Good Feel Better  1st Wednesday of the month 10am-12 noon, [name] (Call [place] to register [phone])

## 2016-05-28 ENCOUNTER — Encounter (HOSPITAL_COMMUNITY): Payer: Medicare Other | Attending: Oncology

## 2016-05-28 ENCOUNTER — Other Ambulatory Visit (HOSPITAL_COMMUNITY): Payer: Self-pay

## 2016-05-28 VITALS — BP 143/71 | HR 82 | Temp 98.3°F | Resp 18

## 2016-05-28 DIAGNOSIS — C180 Malignant neoplasm of cecum: Secondary | ICD-10-CM | POA: Diagnosis not present

## 2016-05-28 MED ORDER — HEPARIN SOD (PORK) LOCK FLUSH 100 UNIT/ML IV SOLN
500.0000 [IU] | Freq: Once | INTRAVENOUS | Status: AC
  Administered 2016-05-28: 500 [IU] via INTRAVENOUS

## 2016-05-28 MED ORDER — SODIUM CHLORIDE 0.9% FLUSH
10.0000 mL | INTRAVENOUS | Status: DC | PRN
  Administered 2016-05-28: 10 mL via INTRAVENOUS
  Filled 2016-05-28: qty 10

## 2016-05-28 MED ORDER — HEPARIN SOD (PORK) LOCK FLUSH 100 UNIT/ML IV SOLN
INTRAVENOUS | Status: AC
  Filled 2016-05-28: qty 5

## 2016-05-28 NOTE — Progress Notes (Signed)
Nathaniel Johnston tolerated 5FU pump well without complaints.5FU pump discontinued and port flushed per protocol. VSS upon discharge. Pt discharged via wheelchair in stable condition with friend

## 2016-05-28 NOTE — Patient Instructions (Signed)
[place] at [facility] Discharge Instructions  RECOMMENDATIONS MADE BY THE CONSULTANT AND ANY TEST RESULTS WILL BE SENT TO YOUR REFERRING PHYSICIAN.  5FU pump discontinued and port flushed per protocol. Follow-up as scheduled. Call clinic for any questions or concerns  Thank you for choosing [place] at [facility] to provide your oncology and hematology care.  To afford each patient quality time with our provider, please arrive at least 15 minutes before your scheduled appointment time.   Beginning January 23rd 2017 lab work for the [name] will be done in the  Main lab at [facility] on 1st floor. If you have a lab appointment with the [place] please come in thru the  Main Entrance and check in at the main information desk  You need to re-schedule your appointment should you arrive 10 or more minutes late.  We strive to give you quality time with our providers, and arriving late affects you and other patients whose appointments are after yours.  Also, if you no show three or more times for appointments you may be dismissed from the clinic at the providers discretion.     Again, thank you for choosing [facility].  Our hope is that these requests will decrease the amount of time that you wait before being seen by our physicians.       _____________________________________________________________  Should you have questions after your visit to [facility], please contact our office at [phone] between the hours of 8:30 a.m. and 4:30 p.m.  Voicemails left after 4:30 p.m. will not be returned until the following business day.  For prescription refill requests, have your pharmacy contact our office.         Resources For Cancer Patients and their Caregivers ? American Cancer Society: Can assist with transportation, wigs, general needs, runs Look Good Feel Better.        435-203-3176 ? Cancer  Care: Provides financial assistance, online support groups, medication/co-pay assistance.  1-800-813-HOPE 815-340-7436) ? [place] Assists [place] cancer patients and their families through emotional , educational and financial support.  6106768183 ? [name] DSS Where to apply for food stamps, Medicaid and utility assistance. 715-681-0243 ? [name]: Transportation to medical appointments. (339)735-6204 ? Social Security Administration: May apply for disability if have a Stage IV cancer. ([phone] [phone] ? [employer], Disability and Transit Services: Assists with nutrition, care and transit needs. [place] Support Programs: @10RELATIVEDAYS @ > Cancer Support Group  2nd Tuesday of the month 1pm-2pm, [name]  > Creative Journey  3rd Tuesday of the month 1130am-1pm, [name]  > Look Good Feel Better  1st Wednesday of the month 10am-12 noon, [name] (Call [place] to register [phone])

## 2016-05-31 ENCOUNTER — Encounter (HOSPITAL_COMMUNITY): Payer: Self-pay | Admitting: Hematology & Oncology

## 2016-05-31 DIAGNOSIS — C189 Malignant neoplasm of colon, unspecified: Secondary | ICD-10-CM | POA: Insufficient documentation

## 2016-05-31 DIAGNOSIS — C772 Secondary and unspecified malignant neoplasm of intra-abdominal lymph nodes: Secondary | ICD-10-CM

## 2016-06-10 ENCOUNTER — Encounter (HOSPITAL_BASED_OUTPATIENT_CLINIC_OR_DEPARTMENT_OTHER): Payer: Medicare Other

## 2016-06-10 ENCOUNTER — Encounter (HOSPITAL_BASED_OUTPATIENT_CLINIC_OR_DEPARTMENT_OTHER): Payer: Medicare Other | Admitting: Oncology

## 2016-06-10 ENCOUNTER — Encounter (HOSPITAL_COMMUNITY): Payer: Self-pay | Admitting: Oncology

## 2016-06-10 VITALS — BP 167/79 | HR 86 | Temp 98.0°F | Resp 18 | Wt 257.0 lb

## 2016-06-10 DIAGNOSIS — C180 Malignant neoplasm of cecum: Secondary | ICD-10-CM

## 2016-06-10 DIAGNOSIS — N186 End stage renal disease: Secondary | ICD-10-CM | POA: Diagnosis not present

## 2016-06-10 DIAGNOSIS — Z992 Dependence on renal dialysis: Secondary | ICD-10-CM | POA: Diagnosis not present

## 2016-06-10 DIAGNOSIS — Z5111 Encounter for antineoplastic chemotherapy: Secondary | ICD-10-CM | POA: Diagnosis not present

## 2016-06-10 LAB — CBC WITH DIFFERENTIAL/PLATELET
Basophils Absolute: 0 10*3/uL (ref 0.0–0.1)
Basophils Relative: 1 %
EOS ABS: 0.1 10*3/uL (ref 0.0–0.7)
Eosinophils Relative: 4 %
HEMATOCRIT: 27.5 % — AB (ref 39.0–52.0)
HEMOGLOBIN: 8.8 g/dL — AB (ref 13.0–17.0)
LYMPHS ABS: 1 10*3/uL (ref 0.7–4.0)
Lymphocytes Relative: 26 %
MCH: 30.8 pg (ref 26.0–34.0)
MCHC: 32 g/dL (ref 30.0–36.0)
MCV: 96.2 fL (ref 78.0–100.0)
MONO ABS: 0.4 10*3/uL (ref 0.1–1.0)
MONOS PCT: 9 %
NEUTROS ABS: 2.5 10*3/uL (ref 1.7–7.7)
NEUTROS PCT: 62 %
Platelets: 240 10*3/uL (ref 150–400)
RBC: 2.86 MIL/uL — ABNORMAL LOW (ref 4.22–5.81)
RDW: 17.4 % — AB (ref 11.5–15.5)
WBC: 4 10*3/uL (ref 4.0–10.5)

## 2016-06-10 LAB — COMPREHENSIVE METABOLIC PANEL
ALK PHOS: 53 U/L (ref 38–126)
ALT: 12 U/L — ABNORMAL LOW (ref 17–63)
ANION GAP: 8 (ref 5–15)
AST: 16 U/L (ref 15–41)
Albumin: 3.3 g/dL — ABNORMAL LOW (ref 3.5–5.0)
BILIRUBIN TOTAL: 0.3 mg/dL (ref 0.3–1.2)
BUN: 23 mg/dL — ABNORMAL HIGH (ref 6–20)
CALCIUM: 8.3 mg/dL — AB (ref 8.9–10.3)
CO2: 30 mmol/L (ref 22–32)
Chloride: 102 mmol/L (ref 101–111)
Creatinine, Ser: 4.98 mg/dL — ABNORMAL HIGH (ref 0.61–1.24)
GFR calc non Af Amer: 11 mL/min — ABNORMAL LOW (ref >60)
GFR, EST AFRICAN AMERICAN: 13 mL/min — AB (ref >60)
Glucose, Bld: 179 mg/dL — ABNORMAL HIGH (ref 65–99)
POTASSIUM: 3.6 mmol/L (ref 3.5–5.1)
Sodium: 140 mmol/L (ref 135–145)
TOTAL PROTEIN: 6.4 g/dL — AB (ref 6.5–8.1)

## 2016-06-10 LAB — MAGNESIUM: MAGNESIUM: 2 mg/dL (ref 1.7–2.4)

## 2016-06-10 MED ORDER — CETUXIMAB CHEMO IV INJECTION 200 MG/100ML
500.0000 mg/m2 | Freq: Once | INTRAVENOUS | Status: AC
  Administered 2016-06-10: 1200 mg via INTRAVENOUS
  Filled 2016-06-10: qty 600

## 2016-06-10 MED ORDER — IRINOTECAN HCL CHEMO INJECTION 100 MG/5ML
90.0000 mg/m2 | Freq: Once | INTRAVENOUS | Status: AC
  Administered 2016-06-10: 220 mg via INTRAVENOUS
  Filled 2016-06-10: qty 11

## 2016-06-10 MED ORDER — ATROPINE SULFATE 1 MG/ML IJ SOLN
INTRAMUSCULAR | Status: AC
  Filled 2016-06-10: qty 1

## 2016-06-10 MED ORDER — SODIUM CHLORIDE 0.9 % IV SOLN
1500.0000 mg/m2 | INTRAVENOUS | Status: DC
  Administered 2016-06-10: 3650 mg via INTRAVENOUS
  Filled 2016-06-10: qty 73

## 2016-06-10 MED ORDER — SODIUM CHLORIDE 0.9 % IV SOLN
Freq: Once | INTRAVENOUS | Status: AC
  Administered 2016-06-10: 12:00:00 via INTRAVENOUS

## 2016-06-10 MED ORDER — SODIUM CHLORIDE 0.9 % IV SOLN: Freq: Once | INTRAVENOUS | Status: DC

## 2016-06-10 MED ORDER — SODIUM CHLORIDE 0.9% FLUSH: 10.0000 mL | INTRAVENOUS | Status: DC | PRN

## 2016-06-10 MED ORDER — FLUOROURACIL CHEMO INJECTION 2.5 GM/50ML
300.0000 mg/m2 | Freq: Once | INTRAVENOUS | Status: AC
  Administered 2016-06-10: 750 mg via INTRAVENOUS
  Filled 2016-06-10: qty 15

## 2016-06-10 MED ORDER — DEXAMETHASONE SODIUM PHOSPHATE 100 MG/10ML IJ SOLN
10.0000 mg | Freq: Once | INTRAMUSCULAR | Status: AC
  Administered 2016-06-10: 10 mg via INTRAVENOUS
  Filled 2016-06-10: qty 1

## 2016-06-10 MED ORDER — DIPHENHYDRAMINE HCL 50 MG/ML IJ SOLN
INTRAMUSCULAR | Status: AC
  Filled 2016-06-10: qty 1

## 2016-06-10 MED ORDER — PALONOSETRON HCL INJECTION 0.25 MG/5ML
INTRAVENOUS | Status: AC
  Filled 2016-06-10: qty 5

## 2016-06-10 MED ORDER — HEPARIN SOD (PORK) LOCK FLUSH 100 UNIT/ML IV SOLN: 500.0000 [IU] | Freq: Once | INTRAVENOUS | Status: DC | PRN

## 2016-06-10 MED ORDER — DIPHENHYDRAMINE HCL 50 MG/ML IJ SOLN
50.0000 mg | Freq: Once | INTRAMUSCULAR | Status: AC
  Administered 2016-06-10: 50 mg via INTRAVENOUS

## 2016-06-10 MED ORDER — PALONOSETRON HCL INJECTION 0.25 MG/5ML
0.2500 mg | Freq: Once | INTRAVENOUS | Status: AC
  Administered 2016-06-10: 0.25 mg via INTRAVENOUS

## 2016-06-10 MED ORDER — ATROPINE SULFATE 1 MG/ML IJ SOLN
0.5000 mg | Freq: Once | INTRAMUSCULAR | Status: AC | PRN
  Administered 2016-06-10: 0.5 mg via INTRAVENOUS

## 2016-06-10 MED ORDER — LEUCOVORIN CALCIUM INJECTION 350 MG
400.0000 mg/m2 | Freq: Once | INTRAVENOUS | Status: AC
  Administered 2016-06-10: 972 mg via INTRAVENOUS
  Filled 2016-06-10: qty 48.6

## 2016-06-10 NOTE — Progress Notes (Signed)
Patient tolerated infusion well.  VSS.  Patient stable and wheeled out via wheelchair by friend upon discharge from clinic.

## 2016-06-10 NOTE — Assessment & Plan Note (Addendum)
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  Now on FOLFIRI + Erbitux  Oncology history updated.  Labs today: CBC diff, CMET, Magnesium.  I personally reviewed and went over laboratory results with the patient.  The results are noted within this dictation. Laboratory work satisfies treatment criteria.  Anemia is noted.  He is starting to develop and acne-like rash on his face. This is likely secondary to cetuximab. He notes that it is not bothersome. We'll not prescribe antibiotic therapy at this time, but if rash progresses or becomes problematic, antibiotics would be indicated.  Return in 2 weeks for treatment and follow-up.

## 2016-06-10 NOTE — Patient Instructions (Signed)
[facility] Discharge Instructions for Patients Receiving Chemotherapy   Beginning January 23rd 2017 lab work for the [facility] will be done in the  Main lab at [facility] on 1st floor. If you have a lab appointment with the [place] please come in thru the  Main Entrance and check in at the main information desk   Today you received the following chemotherapy agents: Irinotecan, Flourouracil, and leucovorin.   If you develop nausea and vomiting, or diarrhea that is not controlled by your medication, call the clinic.  The clinic phone number is [phone]. Office hours are Monday-Friday 8:30am-5:00pm.  BELOW ARE SYMPTOMS THAT SHOULD BE REPORTED IMMEDIATELY:  *FEVER GREATER THAN 101.0 F  *CHILLS WITH OR WITHOUT FEVER  NAUSEA AND VOMITING THAT IS NOT CONTROLLED WITH YOUR NAUSEA MEDICATION  *UNUSUAL SHORTNESS OF BREATH  *UNUSUAL BRUISING OR BLEEDING  TENDERNESS IN MOUTH AND THROAT WITH OR WITHOUT PRESENCE OF ULCERS  *URINARY PROBLEMS  *BOWEL PROBLEMS  UNUSUAL RASH Items with * indicate a potential emergency and should be followed up as soon as possible. If you have an emergency after office hours please contact your primary care physician or go to the nearest emergency department.  Please call the clinic during office hours if you have any questions or concerns.   You may also contact the Patient Navigator at [phone] should you have any questions or need assistance in obtaining follow up care.      Resources For Cancer Patients and their Caregivers ? American Cancer Society: Can assist with transportation, wigs, general needs, runs Look Good Feel Better.        929-323-4155 ? Cancer Care: Provides financial assistance, online support groups, medication/co-pay assistance.  1-800-813-HOPE (308)752-8913) ? [place] Assists [place] cancer patients and their families through emotional , educational and financial  support.  (820)694-3120 ? [name] DSS Where to apply for food stamps, Medicaid and utility assistance. 838-623-1499 ? [name]: Transportation to medical appointments. (303)608-3792 ? Social Security Administration: May apply for disability if have a Stage IV cancer. 856-400-8228 270-825-4934 ? [employer], Disability and Transit Services: Assists with nutrition, care and transit needs. [phone]

## 2016-06-10 NOTE — Patient Instructions (Signed)
[place] at [facility] Discharge Instructions  RECOMMENDATIONS MADE BY THE CONSULTANT AND ANY TEST RESULTS WILL BE SENT TO YOUR REFERRING PHYSICIAN.  You were seen by Nathaniel Johnston today. Return in 2 weeks for follow up, treatment, and labs. Please call the center with any related concerns.  Thank you for choosing [place] at [facility] to provide your oncology and hematology care.  To afford each patient quality time with our provider, please arrive at least 15 minutes before your scheduled appointment time.   Beginning January 23rd 2017 lab work for the [name] will be done in the  Main lab at [facility] on 1st floor. If you have a lab appointment with the [place] please come in thru the  Main Entrance and check in at the main information desk  You need to re-schedule your appointment should you arrive 10 or more minutes late.  We strive to give you quality time with our providers, and arriving late affects you and other patients whose appointments are after yours.  Also, if you no show three or more times for appointments you may be dismissed from the clinic at the providers discretion.     Again, thank you for choosing [facility].  Our hope is that these requests will decrease the amount of time that you wait before being seen by our physicians.       _____________________________________________________________  Should you have questions after your visit to [facility], please contact our office at [phone] between the hours of 8:30 a.m. and 4:30 p.m.  Voicemails left after 4:30 p.m. will not be returned until the following business day.  For prescription refill requests, have your pharmacy contact our office.         Resources For Cancer Patients and their Caregivers ? American Cancer Society: Can assist with transportation, wigs, general needs, runs Look Good Feel Better.         [phone] ? Cancer Care: Provides financial assistance, online support groups, medication/co-pay assistance.  1-800-813-HOPE 734-880-4207) ? [place] Assists [place] cancer patients and their families through emotional , educational and financial support.  660-592-8388 ? [name] DSS Where to apply for food stamps, Medicaid and utility assistance. [phone] ? [name]: Transportation to medical appointments. ([phone] ? Social Security Administration: May apply for disability if have a Stage IV cancer. 662-536-7574 [phone])864-1579 ? [employer], Disability and Transit Services: Assists with nutrition, care and transit needs. [place] Support Programs: @10RELATIVEDAYS @ > Cancer Support Group  2nd Tuesday of the month 1pm-2pm, [name]  > Creative Journey  3rd Tuesday of the month 1130am-1pm, [name]  > Look Good Feel Better  1st Wednesday of the month 10am-12 noon, [name] (Call [place] to register [phone])

## 2016-06-10 NOTE — Progress Notes (Signed)
No PCP Per Patient No address on file  Adenocarcinoma of cecum ([place])  CURRENT THERAPY: FOLFIRI + Erbitux beginning on 04/29/2016  INTERVAL HISTORY: Nathaniel Johnston 60 y.o. 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    Adenocarcinoma of cecum ([place])   06/19/2014 Pathology Results    Biopsy of right colon mass positive for adenocarcinoma. He had multiple other tubular adenomas and an occasional tubulovillous adenomas seen. Cancer is K-ras mutation negative (wild type).      07/19/2014 Procedure    Colonoscopy      07/19/2014 Pathology Results    Right colon mass- invasive adenocarcinoma      07/25/2014 Surgery    Right hemicolectomy performed by Dr. Ladona Horns      07/25/2014 Pathology Results    mucinous adenocarcinoma, 6 cm in maximum dimension, arising from the cecum. positive for 19/25 nodes, +LVI, focal neural invasion, poorly differentiated with numerous extramural nodules, greater than 10      08/10/2014 Pathology Results    KRAS/NRAS mutation in negative.  No KRAS or NRAS mutations were detected in exon 2, 3, and 4.      08/29/2014 - 02/20/2015 Chemotherapy    FOLFOX adjusted for renal failure and on dialysis, 12 cycles      04/02/2015 Imaging    CT abdomen pelvis-interval resolution of previously described anterior abdominal wall abscess. Ventral abdominal wall wound is predominantly healed. Small fat containing ventral abdominal wall hernia. NED.      08/18/2015 Progression         08/18/2015 PET scan    Prominent recurrence with extensive retroperitoneal hypermetabolic adenopathy, extensive adenopathy and nodularity in the right perirenal space and tracking in the right peritoneum. Hypermetabolic mass along the bowel anastomotic site.       09/04/2015 -  Chemotherapy    FOLFOX chemotherapy reinitiated for recurrent disease.      11/28/2015 Imaging    CT abdomen and pelvis-persistent  nodularity along the right pararenal space, reduction in retroperitoneal lymphadenopathy, persistent enlarged retroperitoneal lymph node adjacent to the right kidney, no evidence of disease progression or liver metastases.      03/11/2016 Miscellaneous    Transfer of medical oncology care to [facility] (from [facility])      03/11/2016 Code Status    DNR      03/24/2016 Imaging    CT CAP- Mild increase in abdominal retroperitoneal lymphadenopathy since previous study. New mild right hydronephrosis also noted. No significant change in retroperitoneal soft tissue nodularity in right pararenal space, consistent with metastatic disease      04/29/2016 Treatment Plan Change    Change in therapy      04/29/2016 -  Chemotherapy    FOLFIRI + Erbitux      05/04/2016 - 05/07/2016 Hospital Admission    Admission diagnosis: Acute GI blood loss Additional comments: 2 units of PRBCs administered      05/13/2016 Code Status    FULL CODE        He reports tolerance to therapy. He notes that his episodes of diarrhea have resolved.  Appetite is strong.  He denies any rash.  Review of Systems  Constitutional: Negative.  Negative for chills, fever and weight loss.  HENT: Negative.   Eyes: Negative.   Respiratory: Negative.   Cardiovascular: Positive for leg swelling (Stable, chronic).  Gastrointestinal: Negative for abdominal pain, blood in stool,  constipation, diarrhea, melena, nausea and vomiting.  Genitourinary: Negative.  Negative for dysuria and hematuria.  Musculoskeletal: Negative.   Skin: Negative.   Neurological: Negative.   Endo/Heme/Allergies: Negative.   Psychiatric/Behavioral: Negative.     Past Medical History:  Diagnosis Date  . Adenocarcinoma of cecum ([place]) 03/08/2016  . Cancer ([place]) 07/2014   colon cancer surgery.  Finished Chemo 02/2015  . Chronic kidney disease   . Diabetes mellitus without complication ([place])   . DNR (do not  resuscitate) 03/11/2016  . ESRD (end stage renal disease) ([place]) 03/11/2016  . GERD (gastroesophageal reflux disease)   . High cholesterol   . Hypertension   . Stroke [facility])    2003    Past Surgical History:  Procedure Laterality Date  . AMPUTATION TOE Left 2001  . CATARACT EXTRACTION W/PHACO Right 06/16/2015   Procedure: CATARACT EXTRACTION PHACO AND INTRAOCULAR LENS PLACEMENT RIGHT EYE CDE=54.69;  Surgeon: Williams Che, MD;  Location: [facility] ORS;  Service: Ophthalmology;  Laterality: Right;  . COLECTOMY  07/2014  . DIALYSIS FISTULA CREATION Right   . ESOPHAGOGASTRODUODENOSCOPY N/A 05/06/2016   Procedure: ESOPHAGOGASTRODUODENOSCOPY (EGD);  Surgeon: Rogene [place], MD;  Location: [facility] ENDO SUITE;  Service: Endoscopy;  Laterality: N/A;  . toe amputation Right 2000  . UPPER EXTREMITY ANGIOGRAM Right 05/03/2016   Procedure: RIGHT UPPER EXTREMITY ANGIOGRAM OF ARTERIAL VENOUS FISTULA , BALLOON ANGIOPLASTY;  Surgeon: Vickie Epley, MD;  Location: [facility] ORS;  Service: Vascular;  Laterality: Right;  . YAG LASER APPLICATION Right 41/66/0630   Procedure: YAG LASER APPLICATION;  Surgeon: Williams Che, MD;  Location: [facility] ORS;  Service: Ophthalmology;  Laterality: Right;    History reviewed. No pertinent family history.  Social History   Social History  . Marital status: Married    Spouse name: N/A  . Number of children: N/A  . Years of education: N/A   Occupational History  . [employer]     [employer]   Social History Main Topics  . Smoking status: Former Smoker    Packs/day: 0.50    Years: 15.00    Types: Cigarettes    Quit date: 03/12/1991  . Smokeless tobacco: Never Used  . Alcohol use No  . Drug use: No  . Sexual activity: Not Asked   Other Topics Concern  . None   Social History Narrative  . None     PHYSICAL EXAMINATION  ECOG PERFORMANCE STATUS: 2 - Symptomatic, <50% confined to bed  There were no vitals filed for this visit.  Vitals - 1 value per visit  1/60/1093  SYSTOLIC 235  DIASTOLIC 79  Pulse 86  Temperature 98  Respirations 18   GENERAL:alert, no distress, well nourished, well developed, comfortable, cooperative, obese, smiling and in chemo-bed eating a Candy bar, unaccompanied during exam. SKIN: skin color, texture, turgor are normal, no rashes or significant lesions HEAD: Normocephalic, No masses, lesions, tenderness or abnormalities EYES: normal EARS: External ears normal OROPHARYNX:mucous membranes are moist  NECK: supple, trachea midline LYMPH:  no palpable lymphadenopathy BREAST:not examined LUNGS: clear to auscultation  HEART: regular rate & rhythm ABDOMEN:abdomen soft and normal bowel sounds BACK: Back symmetric, no curvature. EXTREMITIES:less then 2 second capillary refill, no joint deformities, effusion, or inflammation, no skin discoloration, no cyanosis  NEURO: alert & oriented x 3 with fluent speech, no focal motor/sensory deficits, brought to the clinic in a wheelchair.   LABORATORY DATA: CBC    Component Value Date/Time   WBC 4.0 06/10/2016 1100  RBC 2.86 (L) 06/10/2016 1100   HGB 8.8 (L) 06/10/2016 1100   HCT 27.5 (L) 06/10/2016 1100   PLT 240 06/10/2016 1100   MCV 96.2 06/10/2016 1100   MCH 30.8 06/10/2016 1100   MCHC 32.0 06/10/2016 1100   RDW 17.4 (H) 06/10/2016 1100   LYMPHSABS 1.0 06/10/2016 1100   MONOABS 0.4 06/10/2016 1100   EOSABS 0.1 06/10/2016 1100   BASOSABS 0.0 06/10/2016 1100      Chemistry      Component Value Date/Time   NA 140 06/10/2016 1100   K 3.6 06/10/2016 1100   CL 102 06/10/2016 1100   CO2 30 06/10/2016 1100   BUN 23 (H) 06/10/2016 1100   CREATININE 4.98 (H) 06/10/2016 1100      Component Value Date/Time   CALCIUM 8.3 (L) 06/10/2016 1100   ALKPHOS 53 06/10/2016 1100   AST 16 06/10/2016 1100   ALT 12 (L) 06/10/2016 1100   BILITOT 0.3 06/10/2016 1100        PENDING LABS:   RADIOGRAPHIC STUDIES:  No results found.   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  Now on FOLFIRI + Erbitux  Oncology history updated.  Labs today: CBC diff, CMET, Magnesium.  I personally reviewed and went over laboratory results with the patient.  The results are noted within this dictation. Laboratory work satisfies treatment criteria.  Anemia is noted.  He is starting to develop and acne-like rash on his face. This is likely secondary to cetuximab. He notes that it is not bothersome. We'll not prescribe antibiotic therapy at this time, but if rash progresses or becomes problematic, antibiotics would be indicated.  Return in 2 weeks for treatment and follow-up.   ORDERS PLACED FOR THIS ENCOUNTER: No orders of the defined types were placed in this encounter.   MEDICATIONS PRESCRIBED THIS ENCOUNTER: No orders of the defined types were placed in this encounter.   THERAPY PLAN:  Continue palliative treatment as outlined above.  All questions were answered. The patient knows to call the clinic with any problems, questions or concerns. We can certainly see the patient much sooner if necessary.  Patient and plan discussed with Dr. Ancil Linsey and she is in agreement with the aforementioned.   This note is electronically signed by: Doy Mince 06/10/2016 11:55 PM

## 2016-06-11 ENCOUNTER — Encounter (HOSPITAL_COMMUNITY): Payer: Self-pay

## 2016-06-11 ENCOUNTER — Encounter (HOSPITAL_BASED_OUTPATIENT_CLINIC_OR_DEPARTMENT_OTHER): Payer: Medicare Other

## 2016-06-11 VITALS — BP 161/73 | HR 81 | Temp 98.7°F | Resp 18

## 2016-06-11 DIAGNOSIS — C180 Malignant neoplasm of cecum: Secondary | ICD-10-CM

## 2016-06-11 DIAGNOSIS — Z452 Encounter for adjustment and management of vascular access device: Secondary | ICD-10-CM | POA: Diagnosis not present

## 2016-06-11 LAB — CEA: CEA: 134.4 ng/mL — AB (ref 0.0–4.7)

## 2016-06-11 MED ORDER — HEPARIN SOD (PORK) LOCK FLUSH 100 UNIT/ML IV SOLN
500.0000 [IU] | Freq: Once | INTRAVENOUS | Status: AC | PRN
  Administered 2016-06-11: 500 [IU]

## 2016-06-11 MED ORDER — SODIUM CHLORIDE 0.9% FLUSH
10.0000 mL | INTRAVENOUS | Status: DC | PRN
  Administered 2016-06-11: 10 mL
  Filled 2016-06-11: qty 10

## 2016-06-11 MED ORDER — HEPARIN SOD (PORK) LOCK FLUSH 100 UNIT/ML IV SOLN
INTRAVENOUS | Status: AC
  Filled 2016-06-11: qty 5

## 2016-06-11 NOTE — Progress Notes (Signed)
Nathaniel Johnston returns today for port de access and flush after 46 hr continous infusion of 41fu. Tolerated infusion without problems. Portacath located leftchest wall was  deaccessed and flushed with 39ml NS and 500U/32ml Heparin and needle removed intact.  Procedure without incident. Patient tolerated procedure well.

## 2016-06-11 NOTE — Patient Instructions (Signed)
[place] at [facility] Discharge Instructions  RECOMMENDATIONS MADE BY THE CONSULTANT AND ANY TEST RESULTS WILL BE SENT TO YOUR REFERRING PHYSICIAN.  5FU pump disconnected today. Port flush done. Follow up as planned.  Thank you for choosing [place] at [facility] to provide your oncology and hematology care.  To afford each patient quality time with our provider, please arrive at least 15 minutes before your scheduled appointment time.   Beginning January 23rd 2017 lab work for the [name] will be done in the  Main lab at [facility] on 1st floor. If you have a lab appointment with the [place] [facility] please come in thru the  Main Entrance and check in at the main information desk  You need to re-schedule your appointment should you arrive 10 or more minutes late.  We strive to give you quality time with our providers, and arriving late affects you and other patients whose appointments are after yours.  Also, if you no show three or more times for appointments you may be dismissed from the clinic at the providers discretion.     Again, thank you for choosing [facility].  Our hope is that these requests will decrease the amount of time that you wait before being seen by our physicians.       _____________________________________________________________  Should you have questions after your visit to [facility], please contact our office at [phone] between the hours of 8:30 a.m. and 4:30 p.m.  Voicemails left after 4:30 p.m. will not be returned until the following business day.  For prescription refill requests, have your pharmacy contact our office.         Resources For Cancer Patients and their Caregivers ? American Cancer Society: Can assist with transportation, wigs, general needs, runs Look Good Feel Better.        ([phone] ? Cancer Care: Provides financial assistance, online support  groups, medication/co-pay assistance.  1-800-813-HOPE 941-778-9839) ? [place] Assists [place] cancer patients and their families through emotional , educational and financial support.  ([phone] ? [name] DSS Where to apply for food stamps, Medicaid and utility assistance. 202-313-1828 ? [name]: Transportation to medical appointments. (226)651-1316 ? Social Security Administration: May apply for disability if have a Stage IV cancer. [phone] [phone] ? [employer], Disability and Transit Services: Assists with nutrition, care and transit needs. [place] Support Programs: @10RELATIVEDAYS @ > Cancer Support Group  2nd Tuesday of the month 1pm-2pm, [name]  > Creative Journey  3rd Tuesday of the month 1130am-1pm, [name]  > Look Good Feel Better  1st Wednesday of the month 10am-12 noon, [name] (Call [place] to register [phone])

## 2016-06-24 ENCOUNTER — Encounter (HOSPITAL_BASED_OUTPATIENT_CLINIC_OR_DEPARTMENT_OTHER): Payer: Medicare Other

## 2016-06-24 ENCOUNTER — Encounter (HOSPITAL_COMMUNITY): Payer: Self-pay | Admitting: Oncology

## 2016-06-24 ENCOUNTER — Encounter (HOSPITAL_BASED_OUTPATIENT_CLINIC_OR_DEPARTMENT_OTHER): Payer: Medicare Other | Admitting: Oncology

## 2016-06-24 VITALS — BP 154/78 | HR 82 | Temp 98.7°F | Resp 18 | Wt 249.8 lb

## 2016-06-24 DIAGNOSIS — Z992 Dependence on renal dialysis: Secondary | ICD-10-CM | POA: Diagnosis not present

## 2016-06-24 DIAGNOSIS — C180 Malignant neoplasm of cecum: Secondary | ICD-10-CM | POA: Diagnosis not present

## 2016-06-24 DIAGNOSIS — N186 End stage renal disease: Secondary | ICD-10-CM | POA: Diagnosis not present

## 2016-06-24 DIAGNOSIS — Z5111 Encounter for antineoplastic chemotherapy: Secondary | ICD-10-CM | POA: Diagnosis not present

## 2016-06-24 DIAGNOSIS — L700 Acne vulgaris: Secondary | ICD-10-CM

## 2016-06-24 DIAGNOSIS — C772 Secondary and unspecified malignant neoplasm of intra-abdominal lymph nodes: Secondary | ICD-10-CM | POA: Diagnosis not present

## 2016-06-24 DIAGNOSIS — E876 Hypokalemia: Secondary | ICD-10-CM | POA: Diagnosis not present

## 2016-06-24 LAB — CBC WITH DIFFERENTIAL/PLATELET
BASOS ABS: 0 10*3/uL (ref 0.0–0.1)
BASOS PCT: 0 %
EOS ABS: 0.3 10*3/uL (ref 0.0–0.7)
EOS PCT: 7 %
HCT: 27.2 % — ABNORMAL LOW (ref 39.0–52.0)
HEMOGLOBIN: 8.6 g/dL — AB (ref 13.0–17.0)
LYMPHS ABS: 1.1 10*3/uL (ref 0.7–4.0)
Lymphocytes Relative: 24 %
MCH: 29.7 pg (ref 26.0–34.0)
MCHC: 31.6 g/dL (ref 30.0–36.0)
MCV: 93.8 fL (ref 78.0–100.0)
Monocytes Absolute: 0.4 10*3/uL (ref 0.1–1.0)
Monocytes Relative: 8 %
NEUTROS PCT: 61 %
Neutro Abs: 2.8 10*3/uL (ref 1.7–7.7)
PLATELETS: 282 10*3/uL (ref 150–400)
RBC: 2.9 MIL/uL — AB (ref 4.22–5.81)
RDW: 16.2 % — ABNORMAL HIGH (ref 11.5–15.5)
WBC: 4.6 10*3/uL (ref 4.0–10.5)

## 2016-06-24 LAB — COMPREHENSIVE METABOLIC PANEL
ALBUMIN: 3.1 g/dL — AB (ref 3.5–5.0)
ALK PHOS: 47 U/L (ref 38–126)
ALT: 11 U/L — AB (ref 17–63)
AST: 15 U/L (ref 15–41)
Anion gap: 6 (ref 5–15)
BUN: 17 mg/dL (ref 6–20)
CHLORIDE: 102 mmol/L (ref 101–111)
CO2: 31 mmol/L (ref 22–32)
CREATININE: 4.18 mg/dL — AB (ref 0.61–1.24)
Calcium: 8.7 mg/dL — ABNORMAL LOW (ref 8.9–10.3)
GFR calc non Af Amer: 14 mL/min — ABNORMAL LOW (ref >60)
GFR, EST AFRICAN AMERICAN: 16 mL/min — AB (ref >60)
GLUCOSE: 147 mg/dL — AB (ref 65–99)
Potassium: 3.4 mmol/L — ABNORMAL LOW (ref 3.5–5.1)
SODIUM: 139 mmol/L (ref 135–145)
Total Bilirubin: 0.3 mg/dL (ref 0.3–1.2)
Total Protein: 6.2 g/dL — ABNORMAL LOW (ref 6.5–8.1)

## 2016-06-24 LAB — MAGNESIUM: MAGNESIUM: 1.9 mg/dL (ref 1.7–2.4)

## 2016-06-24 MED ORDER — PALONOSETRON HCL INJECTION 0.25 MG/5ML
0.2500 mg | Freq: Once | INTRAVENOUS | Status: AC
  Administered 2016-06-24: 0.25 mg via INTRAVENOUS
  Filled 2016-06-24: qty 5

## 2016-06-24 MED ORDER — ATROPINE SULFATE 1 MG/ML IJ SOLN
0.5000 mg | Freq: Once | INTRAMUSCULAR | Status: AC | PRN
  Administered 2016-06-24: 0.5 mg via INTRAVENOUS
  Filled 2016-06-24: qty 1

## 2016-06-24 MED ORDER — HEPARIN SOD (PORK) LOCK FLUSH 100 UNIT/ML IV SOLN: 500.0000 [IU] | Freq: Once | INTRAVENOUS | Status: DC | PRN

## 2016-06-24 MED ORDER — FLUOROURACIL CHEMO INJECTION 2.5 GM/50ML
300.0000 mg/m2 | Freq: Once | INTRAVENOUS | Status: AC
  Administered 2016-06-24: 750 mg via INTRAVENOUS
  Filled 2016-06-24: qty 15

## 2016-06-24 MED ORDER — DIPHENHYDRAMINE HCL 50 MG/ML IJ SOLN
50.0000 mg | Freq: Once | INTRAMUSCULAR | Status: AC
  Administered 2016-06-24: 50 mg via INTRAVENOUS
  Filled 2016-06-24: qty 1

## 2016-06-24 MED ORDER — SODIUM CHLORIDE 0.9 % IV SOLN
Freq: Once | INTRAVENOUS | Status: AC
  Administered 2016-06-24: 13:00:00 via INTRAVENOUS

## 2016-06-24 MED ORDER — LEUCOVORIN CALCIUM INJECTION 350 MG
400.0000 mg/m2 | Freq: Once | INTRAVENOUS | Status: AC
  Administered 2016-06-24: 972 mg via INTRAVENOUS
  Filled 2016-06-24: qty 48.6

## 2016-06-24 MED ORDER — CETUXIMAB CHEMO IV INJECTION 200 MG/100ML
500.0000 mg/m2 | Freq: Once | INTRAVENOUS | Status: AC
  Administered 2016-06-24: 1200 mg via INTRAVENOUS
  Filled 2016-06-24: qty 600

## 2016-06-24 MED ORDER — POTASSIUM CHLORIDE CRYS ER 20 MEQ PO TBCR
40.0000 meq | EXTENDED_RELEASE_TABLET | Freq: Once | ORAL | Status: AC
  Administered 2016-06-24: 40 meq via ORAL
  Filled 2016-06-24: qty 2

## 2016-06-24 MED ORDER — SODIUM CHLORIDE 0.9 % IV SOLN
1500.0000 mg/m2 | INTRAVENOUS | Status: DC
  Administered 2016-06-24: 3650 mg via INTRAVENOUS
  Filled 2016-06-24: qty 73

## 2016-06-24 MED ORDER — SODIUM CHLORIDE 0.9% FLUSH
10.0000 mL | INTRAVENOUS | Status: DC | PRN
  Administered 2016-06-24: 10 mL
  Filled 2016-06-24: qty 10

## 2016-06-24 MED ORDER — DOXYCYCLINE HYCLATE 100 MG PO TABS: 100.0000 mg | ORAL_TABLET | Freq: Two times a day (BID) | ORAL | 1 refills | Status: DC

## 2016-06-24 MED ORDER — DEXAMETHASONE SODIUM PHOSPHATE 100 MG/10ML IJ SOLN
10.0000 mg | Freq: Once | INTRAMUSCULAR | Status: AC
  Administered 2016-06-24: 10 mg via INTRAVENOUS
  Filled 2016-06-24: qty 1

## 2016-06-24 MED ORDER — IRINOTECAN HCL CHEMO INJECTION 100 MG/5ML
90.0000 mg/m2 | Freq: Once | INTRAVENOUS | Status: AC
  Administered 2016-06-24: 220 mg via INTRAVENOUS
  Filled 2016-06-24: qty 11

## 2016-06-24 NOTE — Patient Instructions (Signed)
[facility] Discharge Instructions for Patients Receiving Chemotherapy   Beginning January 23rd 2017 lab work for the [facility] will be done in the  Main lab at [facility] on 1st floor. If you have a lab appointment with the [facility] please come in thru the  Main Entrance and check in at the main information desk   Today you received the following chemotherapy agents Irinotecan, Leucovorin,Erbitux and 5FU. Pick up script for Doxycycline and start 1 PO BID. Follow-up as scheduled. Call clinic for any questions or concerns  To help prevent nausea and vomiting after your treatment, we encourage you to take your nausea medication   If you develop nausea and vomiting, or diarrhea that is not controlled by your medication, call the clinic.  The clinic phone number is [phone]. Office hours are Monday-Friday 8:30am-5:00pm.  BELOW ARE SYMPTOMS THAT SHOULD BE REPORTED IMMEDIATELY:  *FEVER GREATER THAN 101.0 F  *CHILLS WITH OR WITHOUT FEVER  NAUSEA AND VOMITING THAT IS NOT CONTROLLED WITH YOUR NAUSEA MEDICATION  *UNUSUAL SHORTNESS OF BREATH  *UNUSUAL BRUISING OR BLEEDING  TENDERNESS IN MOUTH AND THROAT WITH OR WITHOUT PRESENCE OF ULCERS  *URINARY PROBLEMS  *BOWEL PROBLEMS  UNUSUAL RASH Items with * indicate a potential emergency and should be followed up as soon as possible. If you have an emergency after office hours please contact your primary care physician or go to the nearest emergency department.  Please call the clinic during office hours if you have any questions or concerns.   You may also contact the Patient Navigator at [phone] should you have any questions or need assistance in obtaining follow up care.      Resources For Cancer Patients and their Caregivers ? American Cancer Society: Can assist with transportation, wigs, general needs, runs Look Good Feel Better.        ([phone] ? Cancer Care: Provides financial  assistance, online support groups, medication/co-pay assistance.  1-800-813-HOPE (228)283-5137) ? [place] Assists [place] cancer patients and their families through emotional , educational and financial support.  470-278-1794 ? [name] DSS Where to apply for food stamps, Medicaid and utility assistance. 778-454-7168 ? [name]: Transportation to medical appointments. (867)479-7941 ? Social Security Administration: May apply for disability if have a Stage IV cancer. [phone] ? [employer], Disability and Transit Services: Assists with nutrition, care and transit needs. [phone]

## 2016-06-24 NOTE — Progress Notes (Signed)
Qais A Mair tolerated chemo tx well without complaints or incident. Pt discharged with FU pump infusing without issues.VSS upon discharge. Pt discharged via wheelchair in stable condition with friend

## 2016-06-24 NOTE — Assessment & Plan Note (Addendum)
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  Now on FOLFIRI + Erbitux  Oncology history updated.  Labs today: CBC diff, CMET, Magnesium.  I personally reviewed and went over laboratory results with the patient.  The results are noted within this dictation. Laboratory work satisfies treatment criteria.  Anemia is noted and stable.  Minimal hypokalemia is noted.  Order is placed for Kdur 40 mEq PO today in clinic.  He is starting to develop and acne-like rash on his face. It is worse today compared to 2 weeks ago.  The patient does admit that it is bothersome for him.  As a result, I will prescribe him oral Doxycycline.  This medication is poorly dialyzed and there is no recommended dosing adjustments necessary.  Rx for Doxycycline 100 mg PO is prescribed.  Weight is down some and we will need to monitor moving forward.  Return in 2 weeks for treatment and follow-up.

## 2016-06-24 NOTE — Progress Notes (Signed)
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 He had multiple other tubular adenomas and an occasional tubulovillous adenomas seen. Cancer is K-ras mutation negative (wild type).      07/19/2014 Procedure    Colonoscopy      07/19/2014 Pathology Results    Right colon mass- invasive adenocarcinoma      07/25/2014 Surgery    Right hemicolectomy performed by Dr. Ladona Horns      07/25/2014 Pathology Results    mucinous adenocarcinoma, 6 cm in maximum dimension, arising from the cecum. positive for 19/25 nodes, +LVI, focal neural invasion, poorly differentiated with numerous extramural nodules, greater than 10      08/10/2014 Pathology Results    KRAS/NRAS mutation in negative.  No KRAS or NRAS mutations were detected in exon 2, 3, and 4.      08/29/2014 - 02/20/2015 Chemotherapy    FOLFOX adjusted for renal failure and on dialysis, 12 cycles      04/02/2015 Imaging    CT abdomen pelvis-interval resolution of previously described anterior abdominal wall abscess. Ventral abdominal wall wound is predominantly healed. Small fat containing ventral abdominal wall hernia. NED.      08/18/2015 Progression         08/18/2015 PET scan    Prominent recurrence with extensive retroperitoneal hypermetabolic adenopathy, extensive adenopathy and nodularity in the right perirenal space and tracking in the right peritoneum. Hypermetabolic mass along the bowel anastomotic site.       09/04/2015 -   Chemotherapy    FOLFOX chemotherapy reinitiated for recurrent disease.      11/28/2015 Imaging    CT abdomen and pelvis-persistent nodularity along the right pararenal space, reduction in retroperitoneal lymphadenopathy, persistent enlarged retroperitoneal lymph node adjacent to the right kidney, no evidence of disease progression or liver metastases.      03/11/2016 Miscellaneous    Transfer of medical oncology care to [facility] (from [facility])      03/11/2016 Code Status    DNR      03/24/2016 Imaging    CT CAP- Mild increase in abdominal retroperitoneal lymphadenopathy since previous study. New mild right hydronephrosis also noted. No significant change in retroperitoneal soft tissue nodularity in right pararenal space, consistent with metastatic disease      04/29/2016 Treatment Plan Change    Change in therapy      04/29/2016 -  Chemotherapy    FOLFIRI + Erbitux      05/04/2016 - 05/07/2016 Hospital Admission    Admission diagnosis: Acute GI blood loss Additional comments: 2 units of PRBCs administered      05/13/2016 Code Status    FULL CODE      He denies any issues with loose stools/diarrhea.  He denies any new pain.  He notes that his appetite is stable, but he has lost some weight.  We will need to monitor closely moving forward.  He does not a facial rash.  He is educated  that this is a side effect of Cetuximab.  We discussed treatment options.  Review of Systems  Constitutional: Positive for weight loss. Negative for chills and fever.  HENT: Negative.   Eyes: Negative.   Respiratory: Negative.   Cardiovascular: Positive for leg swelling (Stable, chronic).  Gastrointestinal: Negative for abdominal pain, blood in stool, constipation, diarrhea, melena, nausea and vomiting.  Genitourinary: Negative.  Negative for dysuria and hematuria.  Musculoskeletal: Negative.   Skin: Positive for rash (pustular/acne).  Neurological: Negative.    Endo/Heme/Allergies: Negative.   Psychiatric/Behavioral: Negative.     Past Medical History:  Diagnosis Date  . 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 Cancer ([place]) 07/2014   colon cancer surgery.  Finished Chemo 02/2015  . Chronic kidney disease   . Diabetes mellitus without complication ([place])   . DNR (do not resuscitate) 03/11/2016  . ESRD (end stage renal disease) ([place]) 03/11/2016  . GERD (gastroesophageal reflux disease)   . High cholesterol   . Hypertension   . Stroke [facility])    2003    Past Surgical History:  Procedure Laterality Date  . AMPUTATION TOE Left 2001  . CATARACT EXTRACTION W/PHACO Right 06/16/2015   Procedure: CATARACT EXTRACTION PHACO AND INTRAOCULAR LENS PLACEMENT RIGHT EYE CDE=54.69;  Surgeon: Williams Che, MD;  Location: [facility] ORS;  Service: Ophthalmology;  Laterality: Right;  . COLECTOMY  07/2014  . DIALYSIS FISTULA CREATION Right   . ESOPHAGOGASTRODUODENOSCOPY N/A 05/06/2016   Procedure: ESOPHAGOGASTRODUODENOSCOPY (EGD);  Surgeon: Rogene Houston, MD;  Location: [facility] ENDO SUITE;  Service: Endoscopy;  Laterality: N/A;  . toe amputation Right 2000  . UPPER EXTREMITY ANGIOGRAM Right 05/03/2016   Procedure: RIGHT UPPER EXTREMITY ANGIOGRAM OF ARTERIAL VENOUS FISTULA , BALLOON ANGIOPLASTY;  Surgeon: Vickie Epley, MD;  Location: [facility] ORS;  Service: Vascular;  Laterality: Right;  . YAG LASER APPLICATION Right 82/95/6213   Procedure: YAG LASER APPLICATION;  Surgeon: Williams Che, MD;  Location: [facility] ORS;  Service: Ophthalmology;  Laterality: Right;    History reviewed. No pertinent family history.  Social History   Social History  . Marital status: Married    Spouse name: N/A  . Number of children: N/A  . Years of education: N/A   Occupational History  . [employer]     [employer]   Social History Main Topics  . Smoking status: Former Smoker    Packs/day: 0.50    Years: 15.00    Types: Cigarettes    Quit date: 03/12/1991  . Smokeless  tobacco: Never Used  . Alcohol use No  . Drug use: No  . Sexual activity: Not Asked   Other Topics Concern  . None   Social History Narrative  . None     PHYSICAL EXAMINATION  ECOG PERFORMANCE STATUS: 2 - Symptomatic, <50% confined to bed  There were no vitals filed for this visit.  Vitals - 1 value per visit 0/86/5784  SYSTOLIC 696  DIASTOLIC 65  Pulse 76  Temperature 98.1  Respirations 18    GENERAL:alert, no distress, well nourished, well developed, comfortable, cooperative, obese, smiling and in chemo-bed. Unkempt.  SKIN: Acne-like/pustular rash on face HEAD: Normocephalic, No masses, lesions, tenderness or abnormalities EYES: normal EARS: External ears normal OROPHARYNX:mucous membranes are moist  NECK: supple, trachea midline LYMPH:  no palpable lymphadenopathy BREAST:not examined LUNGS: clear to auscultation without wheezes, rales, rhonchi.  Decreased breath sounds bilaterally. HEART: regular rate & rhythm ABDOMEN:abdomen soft and normal bowel sounds BACK: Back symmetric, no curvature. EXTREMITIES:less  then 2 second capillary refill, no joint deformities, effusion, or inflammation, no skin discoloration, no cyanosis  NEURO: alert & oriented x 3 with fluent speech, no focal motor/sensory deficits, brought to the clinic in a wheelchair.   LABORATORY DATA: CBC    Component Value Date/Time   WBC 4.6 06/24/2016 1202   RBC 2.90 (L) 06/24/2016 1202   HGB 8.6 (L) 06/24/2016 1202   HCT 27.2 (L) 06/24/2016 1202   PLT 282 06/24/2016 1202   MCV 93.8 06/24/2016 1202   MCH 29.7 06/24/2016 1202   MCHC 31.6 06/24/2016 1202   RDW 16.2 (H) 06/24/2016 1202   LYMPHSABS 1.1 06/24/2016 1202   MONOABS 0.4 06/24/2016 1202   EOSABS 0.3 06/24/2016 1202   BASOSABS 0.0 06/24/2016 1202      Chemistry      Component Value Date/Time   NA 139 06/24/2016 1202   K 3.4 (L) 06/24/2016 1202   CL 102 06/24/2016 1202   CO2 31 06/24/2016 1202   BUN 17 06/24/2016 1202   CREATININE  4.18 (H) 06/24/2016 1202      Component Value Date/Time   CALCIUM 8.7 (L) 06/24/2016 1202   ALKPHOS 47 06/24/2016 1202   AST 15 06/24/2016 1202   ALT 11 (L) 06/24/2016 1202   BILITOT 0.3 06/24/2016 1202        PENDING LABS:   RADIOGRAPHIC STUDIES:  No results found.   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  Now on FOLFIRI + Erbitux  Oncology history updated.  Labs today: CBC diff, CMET, Magnesium.  I personally reviewed and went over laboratory results with the patient.  The results are noted within this dictation. Laboratory work satisfies treatment criteria.  Anemia is noted and stable.  Minimal hypokalemia is noted.  Order is placed for Kdur 40 mEq PO today in clinic.  He is starting to develop and acne-like rash on his face. It is worse today compared to 2 weeks ago.  The patient does admit that it is bothersome for him.  As a result, I will prescribe him oral Doxycycline.  This medication is poorly dialyzed and there is no recommended dosing adjustments necessary.  Rx for Doxycycline 100 mg PO is prescribed.  Weight is down some and we will need to monitor moving forward.  Return in 2 weeks for treatment and follow-up.   ORDERS PLACED FOR THIS ENCOUNTER: No orders of the defined types were placed in this encounter.   MEDICATIONS PRESCRIBED THIS ENCOUNTER: Meds ordered this encounter  Medications  . doxycycline (VIBRA-TABS) 100 MG tablet    Sig: Take 1 tablet (100 mg total) by mouth 2 (two) times daily.    Dispense:  30 tablet    Refill:  1    Order Specific Question:   Supervising Provider    Answer:   Patrici Ranks [id number]  . potassium chloride SA (K-DUR,KLOR-CON) CR tablet 40 mEq    THERAPY PLAN:  Continue  palliative treatment as outlined above.  All questions were answered. The patient knows to call the clinic with any problems, questions or concerns. We can certainly see the patient much sooner if necessary.  Patient and plan discussed with Dr. Ancil Linsey and she is in agreement with the aforementioned.   This note is electronically signed  by: Amerie Beaumont, PA-C 06/24/2016 1:01 PM

## 2016-06-24 NOTE — Progress Notes (Signed)
Acne-like rash noted on pt's face,upper chest and back. Doxycycline script written and given to pt with instructions on how to take it and to call the clinic if the rash gets worse. Pt verbalized understanding. Labs reviewed with Dr. Whitney Muse prior to administering chemo tx. Pt received his Influenza vaccine at Dialysis per pt

## 2016-06-24 NOTE — Patient Instructions (Signed)
[place] at [facility] Discharge Instructions  RECOMMENDATIONS MADE BY THE CONSULTANT AND ANY TEST RESULTS WILL BE SENT TO YOUR REFERRING PHYSICIAN.  You were seen by Gershon Mussel today. Prescription given for Doxycycline  Return in 2 weeks for follow up  Thank you for choosing [place] at [facility] to provide your oncology and hematology care.  To afford each patient quality time with our provider, please arrive at least 15 minutes before your scheduled appointment time.   Beginning January 23rd 2017 lab work for the [name] will be done in the  Main lab at [facility] on 1st floor. If you have a lab appointment with the [facility] please come in thru the  Main Entrance and check in at the main information desk  You need to re-schedule your appointment should you arrive 10 or more minutes late.  We strive to give you quality time with our providers, and arriving late affects you and other patients whose appointments are after yours.  Also, if you no show three or more times for appointments you may be dismissed from the clinic at the providers discretion.     Again, thank you for choosing [facility].  Our hope is that these requests will decrease the amount of time that you wait before being seen by our physicians.       _____________________________________________________________  Should you have questions after your visit to [facility], please contact our office at [phone] between the hours of 8:30 a.m. and 4:30 p.m.  Voicemails left after 4:30 p.m. will not be returned until the following business day.  For prescription refill requests, have your pharmacy contact our office.         Resources For Cancer Patients and their Caregivers ? American Cancer Society: Can assist with transportation, wigs, general needs, runs Look Good Feel Better.        315-834-0290 ? Cancer Care: Provides financial  assistance, online support groups, medication/co-pay assistance.  1-800-813-HOPE (847)200-9231) ? [place] Assists [place] cancer patients and their families through emotional , educational and financial support.  [phone] ? [name] DSS Where to apply for food stamps, Medicaid and utility assistance. 623-139-7383 ? [name]: Transportation to medical appointments. 251-470-7759 ? Social Security Administration: May apply for disability if have a Stage IV cancer. 505 744 0913 315-724-5966 ? [employer], Disability and Transit Services: Assists with nutrition, care and transit needs. [place] Support Programs: @10RELATIVEDAYS @ > Cancer Support Group  2nd Tuesday of the month 1pm-2pm, [name]  > Creative Journey  3rd Tuesday of the month 1130am-1pm, [name]  > Look Good Feel Better  1st Wednesday of the month 10am-12 noon, [name] (Call [place] to register [phone])

## 2016-06-25 ENCOUNTER — Encounter (HOSPITAL_COMMUNITY): Payer: Self-pay

## 2016-06-25 ENCOUNTER — Encounter (HOSPITAL_BASED_OUTPATIENT_CLINIC_OR_DEPARTMENT_OTHER): Payer: Medicare Other

## 2016-06-25 VITALS — BP 135/84 | HR 82 | Temp 99.6°F | Resp 18

## 2016-06-25 DIAGNOSIS — C180 Malignant neoplasm of cecum: Secondary | ICD-10-CM

## 2016-06-25 MED ORDER — HEPARIN SOD (PORK) LOCK FLUSH 100 UNIT/ML IV SOLN
INTRAVENOUS | Status: AC
  Filled 2016-06-25: qty 5

## 2016-06-25 MED ORDER — SODIUM CHLORIDE 0.9% FLUSH
10.0000 mL | INTRAVENOUS | Status: DC | PRN
  Administered 2016-06-25: 10 mL
  Filled 2016-06-25: qty 10

## 2016-06-25 MED ORDER — HEPARIN SOD (PORK) LOCK FLUSH 100 UNIT/ML IV SOLN
500.0000 [IU] | Freq: Once | INTRAVENOUS | Status: AC | PRN
  Administered 2016-06-25: 500 [IU]

## 2016-06-25 NOTE — Patient Instructions (Signed)
[place] at [facility] Discharge Instructions  RECOMMENDATIONS MADE BY THE CONSULTANT AND ANY TEST RESULTS WILL BE SENT TO YOUR REFERRING PHYSICIAN.  Patient disconnected from 5FU pump. Follow up as scheduled.  Thank you for choosing [place] at [facility] to provide your oncology and hematology care.  To afford each patient quality time with our provider, please arrive at least 15 minutes before your scheduled appointment time.   Beginning January 23rd 2017 lab work for the [name] will be done in the  Main lab at [facility] on 1st floor. If you have a lab appointment with the [facility] please come in thru the  Main Entrance and check in at the main information desk  You need to re-schedule your appointment should you arrive 10 or more minutes late.  We strive to give you quality time with our providers, and arriving late affects you and other patients whose appointments are after yours.  Also, if you no show three or more times for appointments you may be dismissed from the clinic at the providers discretion.     Again, thank you for choosing [facility].  Our hope is that these requests will decrease the amount of time that you wait before being seen by our physicians.       _____________________________________________________________  Should you have questions after your visit to [facility], please contact our office at [phone] between the hours of 8:30 a.m. and 4:30 p.m.  Voicemails left after 4:30 p.m. will not be returned until the following business day.  For prescription refill requests, have your pharmacy contact our office.         Resources For Cancer Patients and their Caregivers ? American Cancer Society: Can assist with transportation, wigs, general needs, runs Look Good Feel Better.        ([phone] ? Cancer Care: Provides financial assistance, online support groups,  medication/co-pay assistance.  1-800-813-HOPE 863-332-6660) ? [place] Assists [place] cancer patients and their families through emotional , educational and financial support.  770-268-4921 ? [name] DSS Where to apply for food stamps, Medicaid and utility assistance. (205)754-2545 ? [name]: Transportation to medical appointments. 414-815-2743 ? Social Security Administration: May apply for disability if have a Stage IV cancer. ([phone] 2263861055 ? [employer], Disability and Transit Services: Assists with nutrition, care and transit needs. [place] Support Programs: @10RELATIVEDAYS @ > Cancer Support Group  2nd Tuesday of the month 1pm-2pm, [name]  > Creative Journey  3rd Tuesday of the month 1130am-1pm, [name]  > Look Good Feel Better  1st Wednesday of the month 10am-12 noon, [name] (Call [place] to register [phone])

## 2016-06-25 NOTE — Progress Notes (Signed)
Patient presented today for disconnection from 5FU pump. Patient's vitals are stable. Discharged via wheelchair, with friend.

## 2016-07-08 ENCOUNTER — Encounter (HOSPITAL_COMMUNITY): Payer: Medicare Other | Attending: Oncology

## 2016-07-08 ENCOUNTER — Encounter (HOSPITAL_BASED_OUTPATIENT_CLINIC_OR_DEPARTMENT_OTHER): Payer: Medicare Other | Admitting: Hematology & Oncology

## 2016-07-08 VITALS — BP 125/73 | HR 73 | Temp 98.4°F | Resp 18 | Wt 251.6 lb

## 2016-07-08 DIAGNOSIS — K521 Toxic gastroenteritis and colitis: Secondary | ICD-10-CM | POA: Diagnosis not present

## 2016-07-08 DIAGNOSIS — Z5112 Encounter for antineoplastic immunotherapy: Secondary | ICD-10-CM | POA: Diagnosis not present

## 2016-07-08 DIAGNOSIS — C180 Malignant neoplasm of cecum: Secondary | ICD-10-CM | POA: Diagnosis not present

## 2016-07-08 DIAGNOSIS — Z5111 Encounter for antineoplastic chemotherapy: Secondary | ICD-10-CM | POA: Diagnosis not present

## 2016-07-08 DIAGNOSIS — C772 Secondary and unspecified malignant neoplasm of intra-abdominal lymph nodes: Secondary | ICD-10-CM

## 2016-07-08 DIAGNOSIS — C189 Malignant neoplasm of colon, unspecified: Secondary | ICD-10-CM

## 2016-07-08 DIAGNOSIS — N186 End stage renal disease: Secondary | ICD-10-CM | POA: Diagnosis not present

## 2016-07-08 DIAGNOSIS — Z992 Dependence on renal dialysis: Secondary | ICD-10-CM

## 2016-07-08 LAB — COMPREHENSIVE METABOLIC PANEL
ALBUMIN: 3.3 g/dL — AB (ref 3.5–5.0)
ALT: 11 U/L — ABNORMAL LOW (ref 17–63)
ANION GAP: 7 (ref 5–15)
AST: 18 U/L (ref 15–41)
Alkaline Phosphatase: 55 U/L (ref 38–126)
BUN: 21 mg/dL — AB (ref 6–20)
CHLORIDE: 100 mmol/L — AB (ref 101–111)
CO2: 30 mmol/L (ref 22–32)
Calcium: 8.8 mg/dL — ABNORMAL LOW (ref 8.9–10.3)
Creatinine, Ser: 3.82 mg/dL — ABNORMAL HIGH (ref 0.61–1.24)
GFR calc Af Amer: 18 mL/min — ABNORMAL LOW (ref >60)
GFR calc non Af Amer: 16 mL/min — ABNORMAL LOW (ref >60)
GLUCOSE: 132 mg/dL — AB (ref 65–99)
POTASSIUM: 3.5 mmol/L (ref 3.5–5.1)
Sodium: 137 mmol/L (ref 135–145)
Total Bilirubin: 0.4 mg/dL (ref 0.3–1.2)
Total Protein: 6.5 g/dL (ref 6.5–8.1)

## 2016-07-08 LAB — CBC WITH DIFFERENTIAL/PLATELET
BASOS ABS: 0 10*3/uL (ref 0.0–0.1)
BASOS PCT: 0 %
EOS ABS: 0.3 10*3/uL (ref 0.0–0.7)
EOS PCT: 6 %
HCT: 27.8 % — ABNORMAL LOW (ref 39.0–52.0)
Hemoglobin: 8.8 g/dL — ABNORMAL LOW (ref 13.0–17.0)
Lymphocytes Relative: 23 %
Lymphs Abs: 1.1 10*3/uL (ref 0.7–4.0)
MCH: 29.6 pg (ref 26.0–34.0)
MCHC: 31.7 g/dL (ref 30.0–36.0)
MCV: 93.6 fL (ref 78.0–100.0)
MONO ABS: 0.5 10*3/uL (ref 0.1–1.0)
Monocytes Relative: 10 %
NEUTROS ABS: 2.9 10*3/uL (ref 1.7–7.7)
Neutrophils Relative %: 61 %
PLATELETS: 276 10*3/uL (ref 150–400)
RBC: 2.97 MIL/uL — ABNORMAL LOW (ref 4.22–5.81)
RDW: 16.8 % — AB (ref 11.5–15.5)
WBC: 4.7 10*3/uL (ref 4.0–10.5)

## 2016-07-08 LAB — MAGNESIUM: Magnesium: 1.9 mg/dL (ref 1.7–2.4)

## 2016-07-08 MED ORDER — SODIUM CHLORIDE 0.9 % IV SOLN
1500.0000 mg/m2 | INTRAVENOUS | Status: DC
  Administered 2016-07-08: 3650 mg via INTRAVENOUS
  Filled 2016-07-08: qty 73

## 2016-07-08 MED ORDER — PALONOSETRON HCL INJECTION 0.25 MG/5ML
0.2500 mg | Freq: Once | INTRAVENOUS | Status: AC
  Administered 2016-07-08: 0.25 mg via INTRAVENOUS

## 2016-07-08 MED ORDER — IRINOTECAN HCL CHEMO INJECTION 100 MG/5ML
90.0000 mg/m2 | Freq: Once | INTRAVENOUS | Status: AC
  Administered 2016-07-08: 220 mg via INTRAVENOUS
  Filled 2016-07-08: qty 6

## 2016-07-08 MED ORDER — PALONOSETRON HCL INJECTION 0.25 MG/5ML
INTRAVENOUS | Status: AC
  Filled 2016-07-08: qty 5

## 2016-07-08 MED ORDER — LOPERAMIDE HCL 2 MG PO CAPS: ORAL_CAPSULE | ORAL | 0 refills | Status: DC

## 2016-07-08 MED ORDER — FLUOROURACIL CHEMO INJECTION 2.5 GM/50ML
300.0000 mg/m2 | Freq: Once | INTRAVENOUS | Status: AC
  Administered 2016-07-08: 750 mg via INTRAVENOUS
  Filled 2016-07-08: qty 15

## 2016-07-08 MED ORDER — CETUXIMAB CHEMO IV INJECTION 200 MG/100ML
500.0000 mg/m2 | Freq: Once | INTRAVENOUS | Status: AC
  Administered 2016-07-08: 1200 mg via INTRAVENOUS
  Filled 2016-07-08: qty 500

## 2016-07-08 MED ORDER — ATROPINE SULFATE 1 MG/ML IJ SOLN
INTRAMUSCULAR | Status: AC
  Filled 2016-07-08: qty 1

## 2016-07-08 MED ORDER — LEUCOVORIN CALCIUM INJECTION 350 MG
400.0000 mg/m2 | Freq: Once | INTRAMUSCULAR | Status: AC
  Administered 2016-07-08: 972 mg via INTRAVENOUS
  Filled 2016-07-08: qty 48.6

## 2016-07-08 MED ORDER — DIPHENHYDRAMINE HCL 50 MG/ML IJ SOLN
50.0000 mg | Freq: Once | INTRAMUSCULAR | Status: AC
  Administered 2016-07-08: 50 mg via INTRAVENOUS

## 2016-07-08 MED ORDER — SODIUM CHLORIDE 0.9 % IV SOLN
10.0000 mg | Freq: Once | INTRAVENOUS | Status: AC
  Administered 2016-07-08: 10 mg via INTRAVENOUS
  Filled 2016-07-08: qty 1

## 2016-07-08 MED ORDER — SODIUM CHLORIDE 0.9 % IV SOLN
Freq: Once | INTRAVENOUS | Status: AC
  Administered 2016-07-08: 12:00:00 via INTRAVENOUS

## 2016-07-08 MED ORDER — DIPHENHYDRAMINE HCL 50 MG/ML IJ SOLN
INTRAMUSCULAR | Status: AC
  Filled 2016-07-08: qty 1

## 2016-07-08 MED ORDER — ATROPINE SULFATE 1 MG/ML IJ SOLN
0.5000 mg | Freq: Once | INTRAMUSCULAR | Status: AC | PRN
  Administered 2016-07-08: 0.5 mg via INTRAVENOUS

## 2016-07-08 NOTE — Progress Notes (Signed)
Patient tolerated infusion well.  VSS.  Patient wheeled out of the clinic by friend.  Patient is stable condition.

## 2016-07-08 NOTE — Progress Notes (Signed)
[facility] Hematology/Oncology Progress Note  Name: Nathaniel Johnston      MRN: [id number]     Date: 07/08/2016 Time:2:11 PM   REFERRING PHYSICIAN:  Everardo All, MD ([place])   DIAGNOSIS:  Stage IV adenocarcinoma of colon  HISTORY OF PRESENT ILLNESS:   Nathaniel Johnston is a 60 y.o. 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    Adenocarcinoma of cecum ([place])   06/19/2014 Pathology Results    Biopsy of right colon mass positive for adenocarcinoma. He had multiple other tubular adenomas and an occasional tubulovillous adenomas seen. Cancer is K-ras mutation negative (wild type).      07/19/2014 Procedure    Colonoscopy      07/19/2014 Pathology Results    Right colon mass- invasive adenocarcinoma      07/25/2014 Surgery    Right hemicolectomy performed by Dr. Ladona Horns      07/25/2014 Pathology Results    mucinous adenocarcinoma, 6 cm in maximum dimension, arising from the cecum. positive for 19/25 nodes, +LVI, focal neural invasion, poorly differentiated with numerous extramural nodules, greater than 10      08/10/2014 Pathology Results    KRAS/NRAS mutation in negative.  No KRAS or NRAS mutations were detected in exon 2, 3, and 4.      08/29/2014 - 02/20/2015 Chemotherapy    FOLFOX adjusted for renal failure and on dialysis, 12 cycles      04/02/2015 Imaging    CT abdomen pelvis-interval resolution of previously described anterior abdominal wall abscess. Ventral abdominal wall wound is predominantly healed. Small fat containing ventral abdominal wall hernia. NED.      08/18/2015 Progression         08/18/2015 PET scan    Prominent recurrence with extensive retroperitoneal hypermetabolic adenopathy, extensive adenopathy and  nodularity in the right perirenal space and tracking in the right peritoneum. Hypermetabolic mass along the bowel anastomotic site.       09/04/2015 -  Chemotherapy    FOLFOX chemotherapy reinitiated for recurrent disease.      11/28/2015 Imaging    CT abdomen and pelvis-persistent nodularity along the right pararenal space, reduction in retroperitoneal lymphadenopathy, persistent enlarged retroperitoneal lymph node adjacent to the right kidney, no evidence of disease progression or liver metastases.      03/11/2016 Miscellaneous    Transfer of medical oncology care to [facility] (from [facility])      03/11/2016 Code Status    DNR      03/24/2016 Imaging    CT CAP- Mild increase in abdominal retroperitoneal lymphadenopathy since previous study. New mild right hydronephrosis also noted. No significant change in retroperitoneal soft tissue nodularity in right pararenal space, consistent with metastatic disease      04/29/2016 Treatment Plan Change    Change in therapy      04/29/2016 -  Chemotherapy    FOLFIRI + Erbitux      05/04/2016 - 05/07/2016 Hospital Admission    Admission diagnosis: Acute GI blood loss Additional comments: 2 units of PRBCs administered      05/13/2016 Code Status    FULL CODE       Chart is reviewed in detail.  In summary, the patient was diagnosed with a  Stage IIIC  In 2015.  He was treated definitively with surgery followed by adjuvant chemotherapy consisting of FOLFOX x 6 months.  Unfortunately, he had high-risk disease and recurrence was noted on PET imaging on 08/13/2015 when his CEA climbed.  He was therefore rechallenged with FOLFOX and did well until imaging on 6/28 showed progression of disease. He is currently on FOLFIRI/Erbitux.    Today, Nathaniel Johnston is here for a follow-up for Stage IV adenocarcinoma of colon.  Patient reports difficulty sleeping and fatigue.   He says diarrhea from chemo has slowed down and he  continues to take imodium. He feels that it is well controlled and notes that immodium stops his diarrhea each time.   Nathaniel Johnston states that he feels "okay" and denies any anxiety regarding his disease. When asked about his dialysis, he says "it's the same."  He needs a refill for imodium. He does not need any other refills. He denies change in his appetite or energy level.    Review of Systems  Constitutional: Negative for chills, fever and weight loss.  HENT: Negative for sore throat.   Eyes: Negative for blurred vision and double vision.  Respiratory: Negative for cough, hemoptysis, sputum production, shortness of breath and wheezing.   Cardiovascular: Negative for chest pain and palpitations.  Gastrointestinal: Positive for diarrhea. Negative for abdominal pain, blood in stool, constipation, melena, nausea and vomiting.       Diarrhea managed with imodium and lomotil   Genitourinary: Negative for dysuria, frequency, hematuria and urgency.  Musculoskeletal: Negative for falls and myalgias.  Skin: Negative for itching and rash.  Neurological: Negative for dizziness and headaches.  Endo/Heme/Allergies: Does not bruise/bleed easily.  Psychiatric/Behavioral: The patient has insomnia.   14 point review of systems was performed and is negative except as detailed under history of present illness and above    PAST MEDICAL HISTORY:   Past Medical History:  Diagnosis Date  . Adenocarcinoma of cecum ([place]) 03/08/2016  . Cancer ([place]) 07/2014   colon cancer surgery.  Finished Chemo 02/2015  . Chronic kidney disease   . Diabetes mellitus without complication ([place])   . DNR (do not resuscitate) 03/11/2016  . ESRD (end stage renal disease) ([place]) 03/11/2016  . GERD (gastroesophageal reflux disease)   . High cholesterol   . Hypertension   . Stroke [facility])    2003    ALLERGIES: No Known Allergies    MEDICATIONS: I have reviewed the patient's current medications.    Current Outpatient  Prescriptions on File Prior to Visit  Medication Sig Dispense Refill  . amLODipine (NORVASC) 10 MG tablet Take 2 tablets by mouth daily.     . calcium acetate (PHOSLO) 667 MG capsule Take 1 capsule by mouth 3 (three) times daily with meals.     . Cetuximab (ERBITUX IV) Inject into the vein every 14 (fourteen) days. Every 2 weeks     . dexamethasone (DECADRON) 4 MG tablet Take 2 tablets (8 mg total) by mouth daily. Start the day after chemo for 2 days. 8 tablet 5  . diphenoxylate-atropine (LOMOTIL) 2.5-0.025 MG tablet Take 1 tablet by mouth 4 (four) times daily as needed for diarrhea or loose stools. 60 tablet 0  . doxycycline (VIBRA-TABS) 100 MG tablet Take 1 tablet (100 mg total) by mouth 2 (two) times daily. 30 tablet 1  . fluorouracil CALGB 09811 in sodium chloride 0.9 % 150 mL Inject into the vein over 24 hr.    . glipiZIDE (GLUCOTROL) 10 MG tablet  Take 1 tablet by mouth 2 (two) times daily before a meal.     . HYDROcodone-acetaminophen (NORCO/VICODIN) 5-325 MG tablet Take 1 tablet by mouth every 6 (six) hours as needed for moderate pain.     . irinotecan in dextrose 5 % 500 mL Inject into the vein every 14 (fourteen) days. Every 2 weeks     . Iron Polysacch Cmplx-B12-FA 150-0.025-1 MG CAPS Take 1 capsule by mouth daily.     Marland Kitchen labetalol (NORMODYNE) 300 MG tablet Take 300 mg by mouth 2 (two) times daily.     Marland Kitchen LEUCOVORIN CALCIUM IJ Inject as directed every 14 (fourteen) days.     Marland Kitchen lisinopril (PRINIVIL,ZESTRIL) 40 MG tablet Take 40 mg by mouth daily.     Marland Kitchen loperamide (IMODIUM) 2 MG capsule Take 2 capsules after the first loose stool and then 1 capsule every 2 hours until you go a total of 12 hours without having a loose stool. If it is bedtime and you are having loose stools take 2 capsules every 4 hours until morning. Call [facility]. 90 capsule 0  . ondansetron (ZOFRAN) 8 MG tablet Take 1 tablet (8 mg total) by mouth 2 (two) times daily as needed for refractory nausea / vomiting. Start on  day 3 after chemotherapy. 30 tablet 1  . potassium chloride SA (K-DUR,KLOR-CON) 20 MEQ tablet Take 1 tablet (20 mEq total) by mouth daily. 30 tablet 0  . sevelamer carbonate (RENVELA) 800 MG tablet Take 800 mg by mouth 3 (three) times daily with meals.     . simvastatin (ZOCOR) 80 MG tablet Take 1 tablet by mouth daily at 6 PM.     . Skin Protectants, Misc. (EUCERIN) cream Apply 1 application topically as needed for dry skin.     Marland Kitchen triamcinolone cream (KENALOG) 0.1 % Apply 1 application topically daily as needed (for skin irritation).      Current Facility-Administered Medications on File Prior to Visit  Medication Dose Route Frequency Provider Last Rate Last Dose  . atropine injection 0.5 mg  0.5 mg Intravenous Once PRN Patrici Ranks, MD      . fluorouracil (ADRUCIL) 3,650 mg in sodium chloride 0.9 % 77 mL chemo infusion  1,500 mg/m2 (Treatment Plan Recorded) Intravenous over 24 hr Patrici Ranks, MD      . fluorouracil (ADRUCIL) chemo injection 750 mg  300 mg/m2 (Treatment Plan Recorded) Intravenous Once Patrici Ranks, MD      . irinotecan (CAMPTOSAR) 220 mg in dextrose 5 % 500 mL chemo infusion  90 mg/m2 (Treatment Plan Recorded) Intravenous Once Patrici Ranks, MD      . leucovorin 972 mg in dextrose 5 % 250 mL infusion  400 mg/m2 (Treatment Plan Recorded) Intravenous Once Patrici Ranks, MD         PAST SURGICAL HISTORY Past Surgical History:  Procedure Laterality Date  . AMPUTATION TOE Left 2001  . CATARACT EXTRACTION W/PHACO Right 06/16/2015   Procedure: CATARACT EXTRACTION PHACO AND INTRAOCULAR LENS PLACEMENT RIGHT EYE CDE=54.69;  Surgeon: Williams Che, MD;  Location: [facility] ORS;  Service: Ophthalmology;  Laterality: Right;  . COLECTOMY  07/2014  . DIALYSIS FISTULA CREATION Right   . ESOPHAGOGASTRODUODENOSCOPY N/A 05/06/2016   Procedure: ESOPHAGOGASTRODUODENOSCOPY (EGD);  Surgeon: Rogene Houston, MD;  Location: [facility] ENDO SUITE;  Service: Endoscopy;  Laterality: N/A;  .  toe amputation Right 2000  . UPPER EXTREMITY ANGIOGRAM Right 05/03/2016   Procedure: RIGHT UPPER EXTREMITY ANGIOGRAM OF ARTERIAL VENOUS FISTULA ,  BALLOON ANGIOPLASTY;  Surgeon: Vickie Epley, MD;  Location: [facility] ORS;  Service: Vascular;  Laterality: Right;  . YAG LASER APPLICATION Right 09/62/8366   Procedure: YAG LASER APPLICATION;  Surgeon: Williams Che, MD;  Location: [facility] ORS;  Service: Ophthalmology;  Laterality: Right;    FAMILY HISTORY: No family history on file.  He has a brother who lives nearby. He also has a sister His mother is living.  Her health is "good"  She is 33 yo Father is deceased in his 44's from unknown reason. He has a daughter and 2 grandbabies.  They live in [place] as she is married to a Nature conservation officer man.  SOCIAL HISTORY:  reports that he quit smoking about 25 years ago. His smoking use included Cigarettes. He has a 7.50 pack-year smoking history. He has never used smokeless tobacco. He reports that he does not drink alcohol or use drugs. He is a Engineer, manufacturing.  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  He retired with disability from 2 work injuries leading to bilateral amputation of #1 toe.  He ascertained disability in 2003.  Social History   Social History  . Marital status: Married    Spouse name: N/A  . Number of children: N/A  . Years of education: N/A   Occupational History  . Metal [employer]     [employer]   Social History Main Topics  . Smoking status: Former Smoker    Packs/day: 0.50    Years: 15.00    Types: Cigarettes    Quit date: 03/12/1991  . Smokeless tobacco: Never Used  . Alcohol use No  . Drug use: No  . Sexual activity: Not on file   Other Topics Concern  . Not on file   Social History Narrative  . No narrative on file    PERFORMANCE STATUS: The patient's performance status is 2 - Symptomatic, <50% confined to bed  PHYSICAL EXAM:  Most Recent Vital Signs: Vitals with BMI 07/08/2016  Height    Weight 251 lbs 10 oz  BMI   Systolic 294  Diastolic 73  Pulse 73  Respirations 18    General appearance: alert, appears older than stated age, no distress, moderately obese and in treatment bed Head: Normocephalic, without obvious abnormality, atraumatic Throat: normal findings: oropharynx pink & moist without lesions or evidence of thrush Neck: supple, symmetrical, trachea midline Lungs: clear to auscultation bilaterally and normal percussion bilaterally Heart: regular rate and rhythm Extremities: edema B/L 1+ pitting edema pre-tibially without erythema or heat.  No tenderness to palpation.  Socks carry his cell phone and wallet. Skin: Skin color, texture, turgor normal. No rashes or lesions Neurologic: Grossly normal  LABORATORY DATA:  I have reviewed the data as listed below.  Results for orders placed or performed in visit on 07/08/16 (from the past 48 hour(s))  CBC with Differential     Status: Abnormal   Collection Time: 07/08/16 11:23 AM  Result Value Ref Range   WBC 4.7 4.0 - 10.5 K/uL   RBC 2.97 (L) 4.22 - 5.81 MIL/uL   Hemoglobin 8.8 (L) 13.0 - 17.0 g/dL   HCT 27.8 (L) 39.0 - 52.0 %   MCV 93.6 78.0 - 100.0 fL   MCH 29.6 26.0 - 34.0 pg   MCHC 31.7 30.0 - 36.0 g/dL   RDW 16.8 (H) 11.5 - 15.5 %   Platelets 276 150 - 400 K/uL   Neutrophils Relative % 61 %   Neutro Abs  2.9 1.7 - 7.7 K/uL   Lymphocytes Relative 23 %   Lymphs Abs 1.1 0.7 - 4.0 K/uL   Monocytes Relative 10 %   Monocytes Absolute 0.5 0.1 - 1.0 K/uL   Eosinophils Relative 6 %   Eosinophils Absolute 0.3 0.0 - 0.7 K/uL   Basophils Relative 0 %   Basophils Absolute 0.0 0.0 - 0.1 K/uL  Comprehensive metabolic panel     Status: Abnormal   Collection Time: 07/08/16 11:23 AM  Result Value Ref Range   Sodium 137 135 - 145 mmol/L   Potassium 3.5 3.5 - 5.1 mmol/L   Chloride 100 (L) 101 - 111 mmol/L   CO2 30 22 - 32 mmol/L   Glucose, Bld 132 (H) 65 - 99 mg/dL   BUN 21 (H) 6 - 20 mg/dL   Creatinine, Ser 3.82  (H) 0.61 - 1.24 mg/dL   Calcium 8.8 (L) 8.9 - 10.3 mg/dL   Total Protein 6.5 6.5 - 8.1 g/dL   Albumin 3.3 (L) 3.5 - 5.0 g/dL   AST 18 15 - 41 U/L   ALT 11 (L) 17 - 63 U/L   Alkaline Phosphatase 55 38 - 126 U/L   Total Bilirubin 0.4 0.3 - 1.2 mg/dL   GFR calc non Af Amer 16 (L) >60 mL/min   GFR calc Af Amer 18 (L) >60 mL/min    Comment: (NOTE) The eGFR has been calculated using the CKD EPI equation. This calculation has not been validated in all clinical situations. eGFR's persistently <60 mL/min signify possible Chronic Kidney Disease.    Anion gap 7 5 - 15  Magnesium     Status: None   Collection Time: 07/08/16 11:23 AM  Result Value Ref Range   Magnesium 1.9 1.7 - 2.4 mg/dL     Results for MERRIL, NAGY (MRN [id number])  Ref. Range 04/29/2016 11:24 05/13/2016 11:25 06/10/2016 11:00 07/08/2016 11:23  CEA Latest Ref Range: 0.0 - 4.7 ng/mL 220.4 (H) 135.5 (H) 134.4 (H) 170.8 (H)     RADIOGRAPHY: No results found.    I have reviewed the data as listed below.   Study Result     CLINICAL DATA: Followup metastatic adenocarcinoma of cecum. Previous surgery and chemotherapy.  EXAM: CT CHEST, ABDOMEN, AND PELVIS WITH CONTRAST  TECHNIQUE: Multidetector CT imaging of the chest, abdomen and pelvis was performed following the standard protocol during bolus administration of intravenous contrast.  CONTRAST: 178m ISOVUE-300 IOPAMIDOL (ISOVUE-300) INJECTION 61%  COMPARISON: [facility] CT on 11/28/2015 and PET-CT on 08/13/2015  FINDINGS: CT CHEST FINDINGS  Mediastinum/Lymph Nodes: 9 mm posterior paraesophageal lymph node is seen on image 29/series 2, which measures 11 mm on previous study. No other sites of lymphadenopathy identified within the thorax. Three-vessel coronary artery calcification noted.  Lungs/Pleura: No pulmonary mass, infiltrate, or effusion. Mild bilateral lower lobe scarring is stable. Azygos fissure again noted.  Musculoskeletal: No chest wall mass or  suspicious bone lesions identified.  CT ABDOMEN PELVIS FINDINGS  Hepatobiliary: No liver masses are identified. Tiny calcified gallstones again seen, however there is no evidence of cholecystitis or biliary dilatation.  Pancreas: No mass, inflammatory changes, or other significant abnormality.  Spleen: Within normal limits in size and appearance.  Adrenals/Urinary Tract: No adrenal or renal masses are identified. Bilateral renal parenchymal atrophy again demonstrated as well as tiny right renal cysts. Mild right hydronephrosis is new since previous study. No ureteral calculi identified.  Soft tissue thickening and nodularity in the right perinephric space shows no significant interval change,  and is consistent with retroperitoneal metastatic disease.  Stomach/Bowel: No evidence of obstruction, inflammatory process, or abnormal fluid collections. Postop changes from partial right colectomy again demonstrated. Severe diverticulosis is again seen involving the descending and sigmoid colon, however there is no evidence of diverticulitis.  Vascular/Lymphatic: Abdominal retroperitoneal lymphadenopathy in the aorto-caval and left paraaortic regions is again demonstrated. Index lymph node in the aortocaval space measures 2.4 cm on image 69/series 2 compared to 1.3 cm previously. Index lymph node in the left paraaortic region measures 2.6 cm on image 77/series 2 compared to 1.9 cm previously. No pelvic lymphadenopathy identified.  No evidence of abdominal aortic aneurysm. Aortic atherosclerosis noted.  Reproductive: No mass or other significant abnormality.  Other: None.  Musculoskeletal: No suspicious bone lesions identified.  IMPRESSION: Mild increase in abdominal retroperitoneal lymphadenopathy since previous study. New mild right hydronephrosis also noted.  No significant change in retroperitoneal soft tissue nodularity in right pararenal space, consistent  with metastatic disease.  Slight decrease in left mediastinal posterior paraesophageal lymph node compared to previous PET on 08/13/2015, currently measuring 9 mm compared to 11 mm previously.  Cholelithiasis. No radiographic evidence of cholecystitis.  Aortic atherosclerosis noted.  Colonic diverticulosis. No radiographic evidence of diverticulitis.   Electronically Signed  By: Earle Gell M.D.  On: 03/24/2016 14:02    PATHOLOGY:  N/A   ASSESSMENT/PLAN:   Adenocarcinoma of cecum ([place]), Stage IV ESRD on Dialysis KRAS/NRAS WT  Stage IV adenocarcinoma of colon found on PET imaging on 08/13/2015 after being diagnosed with Stage IIIC ([id number]) on 07/25/2014 treated with definitive surgery followed by 6 months of adjuvant therapy consisting of FOLFOX. Patient was restarted on FOLFOX with dosing according to dialysis requirements. Progression of disease noted by rising CEA and on imaging from 03/24/2016. He is currently on FOLFIRI/ERBITUX.   Microsatellite status is unknown.   Will proceed forward with  FOLFIRI/Erbitux. Unfortunately the patient did not receive cycle #2 of Erbitux secondary to error. He is currently receiving it Q2 weeks secondary to transportation issues. Drug is being dosed accordingly  We discussed toxicities in detail, diarrhea early and late, skin toxicities. I emphasized the importance of notifying us if his diarrhea does not resolve with imodium or lomotil. He notes that it is currently controlled.   His next cycle is 07/22/16. I will order repeat CT scans for 11/17. CEA has climbed somewhat today.   I have refilled patient's imodium.   Robynn Pane, PA-C will follow up with patient on 08/05/2016.  Orders Placed This Encounter  Procedures  . CT Chest W Contrast    Standing Status:   Future    Number of Occurrences:   1    Standing Expiration Date:   07/08/2017    Order Specific Question:   If indicated for the ordered procedure, I authorize the  administration of contrast media per Radiology protocol    Answer:   Yes    Order Specific Question:   Reason for Exam (SYMPTOM  OR DIAGNOSIS REQUIRED)    Answer:   restaging stage IV CRC    Order Specific Question:   Preferred imaging location?    Answer:   [facility]  . CEA    Standing Status:   Future    Number of Occurrences:   1    Standing Expiration Date:   07/08/2017  . Comprehensive metabolic panel    Standing Status:   Future    Number of Occurrences:   1    Standing Expiration Date:  07/08/2017  . CBC with Differential    Standing Status:   Future    Number of Occurrences:   1    Standing Expiration Date:   07/08/2017  . CBC with Differential    Standing Status:   Future    Standing Expiration Date:   07/08/2017  . Comprehensive metabolic panel    Standing Status:   Future    Standing Expiration Date:   07/08/2017  . CEA    Standing Status:   Future    Standing Expiration Date:   07/08/2017    All questions were answered. The patient knows to call the clinic with any problems, questions or concerns. We can certainly see the patient much sooner if necessary.  This document serves as a record of services personally performed by Ancil Linsey, MD. It was created on her behalf by Elmyra Ricks, a trained medical scribe. The creation of this record is based on the scribe's personal observations and the provider's statements to them. This document has been checked and approved by the attending provider.  I have reviewed the above documentation for accuracy and completeness and I agree with the above.  This note is electronically signed by: Molli Hazard, MD  07/08/2016 2:11 PM

## 2016-07-08 NOTE — Patient Instructions (Signed)
[place] at [facility] Discharge Instructions  RECOMMENDATIONS MADE BY THE CONSULTANT AND ANY TEST RESULTS WILL BE SENT TO YOUR REFERRING PHYSICIAN.  You saw Dr. Whitney Muse today. Follow up in 2 weeks with Tom, chemo and labs. CT scans in 3 weeks. Follow up in 4 weeks with Tom, chemo and labs.  Thank you for choosing [place] at [facility] to provide your oncology and hematology care.  To afford each patient quality time with our provider, please arrive at least 15 minutes before your scheduled appointment time.   Beginning January 23rd 2017 lab work for the [name] will be done in the  Main lab at [facility] on 1st floor. If you have a lab appointment with the [facility] please come in thru the  Main Entrance and check in at the main information desk  You need to re-schedule your appointment should you arrive 10 or more minutes late.  We strive to give you quality time with our providers, and arriving late affects you and other patients whose appointments are after yours.  Also, if you no show three or more times for appointments you may be dismissed from the clinic at the providers discretion.     Again, thank you for choosing [facility].  Our hope is that these requests will decrease the amount of time that you wait before being seen by our physicians.       _____________________________________________________________  Should you have questions after your visit to [facility], please contact our office at [phone] between the hours of 8:30 a.m. and 4:30 p.m.  Voicemails left after 4:30 p.m. will not be returned until the following business day.  For prescription refill requests, have your pharmacy contact our office.         Resources For Cancer Patients and their Caregivers ? American Cancer Society: Can assist with transportation, wigs, general needs, runs Look Good Feel Better.         239 672 0208 ? Cancer Care: Provides financial assistance, online support groups, medication/co-pay assistance.  1-800-813-HOPE 208-752-7332) ? [facility] Assists [place] cancer patients and their families through emotional , educational and financial support.  (928)562-0888 ? [name] DSS Where to apply for food stamps, Medicaid and utility assistance. 872-026-1877 ? [name]: Transportation to medical appointments. 365-876-5463 ? Social Security Administration: May apply for disability if have a Stage IV cancer. (518)100-1100 204 854 9984 ? [employer], Disability and Transit Services: Assists with nutrition, care and transit needs. [place] Support Programs: @10RELATIVEDAYS @ > Cancer Support Group  2nd Tuesday of the month 1pm-2pm, [name]  > Creative Journey  3rd Tuesday of the month 1130am-1pm, [name]  > Look Good Feel Better  1st Wednesday of the month 10am-12 noon, [name] (Call [place] to register [phone])

## 2016-07-08 NOTE — Patient Instructions (Signed)
[facility] Discharge Instructions for Patients Receiving Chemotherapy   Beginning January 23rd 2017 lab work for the [facility] will be done in the  Main lab at [facility] on 1st floor. If you have a lab appointment with the [facility] please come in thru the  Main Entrance and check in at the main information desk   Today you received the following chemotherapy agents: Erbitux, Leucovorin, Irinotecan, and fluorouracil.     If you develop nausea and vomiting, or diarrhea that is not controlled by your medication, call the clinic.  The clinic phone number is [phone]. Office hours are Monday-Friday 8:30am-5:00pm.  BELOW ARE SYMPTOMS THAT SHOULD BE REPORTED IMMEDIATELY:  *FEVER GREATER THAN 101.0 F  *CHILLS WITH OR WITHOUT FEVER  NAUSEA AND VOMITING THAT IS NOT CONTROLLED WITH YOUR NAUSEA MEDICATION  *UNUSUAL SHORTNESS OF BREATH  *UNUSUAL BRUISING OR BLEEDING  TENDERNESS IN MOUTH AND THROAT WITH OR WITHOUT PRESENCE OF ULCERS  *URINARY PROBLEMS  *BOWEL PROBLEMS  UNUSUAL RASH Items with * indicate a potential emergency and should be followed up as soon as possible. If you have an emergency after office hours please contact your primary care physician or go to the nearest emergency department.  Please call the clinic during office hours if you have any questions or concerns.   You may also contact the Patient Navigator at [phone] should you have any questions or need assistance in obtaining follow up care.      Resources For Cancer Patients and their Caregivers ? American Cancer Society: Can assist with transportation, wigs, general needs, runs Look Good Feel Better.        339-064-1211 ? Cancer Care: Provides financial assistance, online support groups, medication/co-pay assistance.  1-800-813-HOPE (817)467-3684) ? [place] Assists [place] cancer patients and their families through emotional , educational and  financial support.  4305937434 ? [name] DSS Where to apply for food stamps, Medicaid and utility assistance. ([phone] 732-7909 ? [name]: Transportation to medical appointments. [phone] ? Social Security Administration: May apply for disability if have a Stage IV cancer. [phone] [phone] ? [employer], Disability and Transit Services: Assists with nutrition, care and transit needs. [phone]

## 2016-07-09 ENCOUNTER — Encounter (HOSPITAL_BASED_OUTPATIENT_CLINIC_OR_DEPARTMENT_OTHER): Payer: Medicare Other

## 2016-07-09 VITALS — BP 142/79 | HR 78 | Temp 98.0°F | Resp 18

## 2016-07-09 DIAGNOSIS — C180 Malignant neoplasm of cecum: Secondary | ICD-10-CM

## 2016-07-09 LAB — CEA: CEA: 170.8 ng/mL — AB (ref 0.0–4.7)

## 2016-07-09 MED ORDER — SODIUM CHLORIDE 0.9% FLUSH
10.0000 mL | INTRAVENOUS | Status: DC | PRN
  Administered 2016-07-09: 10 mL
  Filled 2016-07-09: qty 10

## 2016-07-09 MED ORDER — HEPARIN SOD (PORK) LOCK FLUSH 100 UNIT/ML IV SOLN
500.0000 [IU] | Freq: Once | INTRAVENOUS | Status: AC | PRN
  Administered 2016-07-09: 500 [IU]

## 2016-07-09 MED ORDER — HEPARIN SOD (PORK) LOCK FLUSH 100 UNIT/ML IV SOLN
INTRAVENOUS | Status: AC
  Filled 2016-07-09: qty 5

## 2016-07-09 NOTE — Patient Instructions (Signed)
 at [facility] Discharge Instructions  RECOMMENDATIONS MADE BY THE CONSULTANT AND ANY TEST RESULTS WILL BE SENT TO YOUR REFERRING PHYSICIAN.  Pump removal.    Thank you for choosing [place] at [facility] to provide your oncology and hematology care.  To afford each patient quality time with our provider, please arrive at least 15 minutes before your scheduled appointment time.   Beginning January 23rd 2017 lab work for the [name] will be done in the  Main lab at [facility] on 1st floor. If you have a lab appointment with the [facility] please come in thru the  Main Entrance and check in at the main information desk  You need to re-schedule your appointment should you arrive 10 or more minutes late.  We strive to give you quality time with our providers, and arriving late affects you and other patients whose appointments are after yours.  Also, if you no show three or more times for appointments you may be dismissed from the clinic at the providers discretion.     Again, thank you for choosing [facility].  Our hope is that these requests will decrease the amount of time that you wait before being seen by our physicians.       _____________________________________________________________  Should you have questions after your visit to [facility], please contact our office at [phone] between the hours of 8:30 a.m. and 4:30 p.m.  Voicemails left after 4:30 p.m. will not be returned until the following business day.  For prescription refill requests, have your pharmacy contact our office.         Resources For Cancer Patients and their Caregivers ? American Cancer Society: Can assist with transportation, wigs, general needs, runs Look Good Feel Better.        720-141-6652 ? Cancer Care: Provides financial assistance, online support groups, medication/co-pay assistance.  1-800-813-HOPE  [phone]) ? [place] Assists [place] cancer patients and their families through emotional , educational and financial support.  763-169-6776 ? [name] DSS Where to apply for food stamps, Medicaid and utility assistance. 407-389-3942 ? [name]: Transportation to medical appointments. 903-792-0687 ? Social Security Administration: May apply for disability if have a Stage IV cancer. [phone] ? [employer], Disability and Transit Services: Assists with nutrition, care and transit needs. [place] Support Programs: @10RELATIVEDAYS @ > Cancer Support Group  2nd Tuesday of the month 1pm-2pm, [name]  > Creative Journey  3rd Tuesday of the month 1130am-1pm, [name]  > Look Good Feel Better  1st Wednesday of the month 10am-12 noon, [name] (Call [place] to register [phone])

## 2016-07-09 NOTE — Progress Notes (Signed)
Patient arrives for home infusion pump removal and port de-access and flush.  Blood return noted and port flushed with NS and heparin per protocol.  VSS.  Patient stable and wheeled out of clinic at discharge by friend.  Patient and friend also educated that the immodium prescription was sent to the pharmacy and that he needed to pick it up ASAP and begin to take it as instructed.

## 2016-07-16 ENCOUNTER — Other Ambulatory Visit (HOSPITAL_COMMUNITY): Payer: Self-pay | Admitting: Emergency Medicine

## 2016-07-16 DIAGNOSIS — C180 Malignant neoplasm of cecum: Secondary | ICD-10-CM

## 2016-07-16 DIAGNOSIS — R197 Diarrhea, unspecified: Secondary | ICD-10-CM

## 2016-07-16 MED ORDER — LOPERAMIDE HCL 2 MG PO CAPS: ORAL_CAPSULE | ORAL | 0 refills | Status: DC

## 2016-07-22 ENCOUNTER — Encounter (HOSPITAL_COMMUNITY): Payer: Self-pay

## 2016-07-22 ENCOUNTER — Encounter (HOSPITAL_BASED_OUTPATIENT_CLINIC_OR_DEPARTMENT_OTHER): Payer: Medicare Other

## 2016-07-22 VITALS — BP 143/76 | HR 74 | Temp 98.2°F | Resp 18 | Wt 246.4 lb

## 2016-07-22 DIAGNOSIS — C772 Secondary and unspecified malignant neoplasm of intra-abdominal lymph nodes: Secondary | ICD-10-CM

## 2016-07-22 DIAGNOSIS — Z5112 Encounter for antineoplastic immunotherapy: Secondary | ICD-10-CM | POA: Diagnosis not present

## 2016-07-22 DIAGNOSIS — Z5111 Encounter for antineoplastic chemotherapy: Secondary | ICD-10-CM | POA: Diagnosis not present

## 2016-07-22 DIAGNOSIS — C180 Malignant neoplasm of cecum: Secondary | ICD-10-CM

## 2016-07-22 DIAGNOSIS — C189 Malignant neoplasm of colon, unspecified: Secondary | ICD-10-CM

## 2016-07-22 LAB — COMPREHENSIVE METABOLIC PANEL
ALBUMIN: 3.6 g/dL (ref 3.5–5.0)
ALT: 13 U/L — AB (ref 17–63)
AST: 19 U/L (ref 15–41)
Alkaline Phosphatase: 48 U/L (ref 38–126)
Anion gap: 9 (ref 5–15)
BUN: 20 mg/dL (ref 6–20)
CHLORIDE: 97 mmol/L — AB (ref 101–111)
CO2: 31 mmol/L (ref 22–32)
Calcium: 8.7 mg/dL — ABNORMAL LOW (ref 8.9–10.3)
Creatinine, Ser: 4.74 mg/dL — ABNORMAL HIGH (ref 0.61–1.24)
GFR calc Af Amer: 14 mL/min — ABNORMAL LOW (ref >60)
GFR calc non Af Amer: 12 mL/min — ABNORMAL LOW (ref >60)
GLUCOSE: 69 mg/dL (ref 65–99)
POTASSIUM: 3.2 mmol/L — AB (ref 3.5–5.1)
Sodium: 137 mmol/L (ref 135–145)
Total Bilirubin: 0.5 mg/dL (ref 0.3–1.2)
Total Protein: 6.9 g/dL (ref 6.5–8.1)

## 2016-07-22 LAB — CBC WITH DIFFERENTIAL/PLATELET
Basophils Absolute: 0 10*3/uL (ref 0.0–0.1)
Basophils Relative: 0 %
EOS PCT: 4 %
Eosinophils Absolute: 0.2 10*3/uL (ref 0.0–0.7)
HCT: 29.5 % — ABNORMAL LOW (ref 39.0–52.0)
Hemoglobin: 9.3 g/dL — ABNORMAL LOW (ref 13.0–17.0)
LYMPHS ABS: 1.1 10*3/uL (ref 0.7–4.0)
LYMPHS PCT: 23 %
MCH: 30.2 pg (ref 26.0–34.0)
MCHC: 31.5 g/dL (ref 30.0–36.0)
MCV: 95.8 fL (ref 78.0–100.0)
MONO ABS: 0.5 10*3/uL (ref 0.1–1.0)
MONOS PCT: 11 %
Neutro Abs: 3 10*3/uL (ref 1.7–7.7)
Neutrophils Relative %: 62 %
PLATELETS: 315 10*3/uL (ref 150–400)
RBC: 3.08 MIL/uL — AB (ref 4.22–5.81)
RDW: 17 % — AB (ref 11.5–15.5)
WBC: 4.7 10*3/uL (ref 4.0–10.5)

## 2016-07-22 LAB — MAGNESIUM: Magnesium: 1.9 mg/dL (ref 1.7–2.4)

## 2016-07-22 MED ORDER — SODIUM CHLORIDE 0.9 % IV SOLN
1500.0000 mg/m2 | INTRAVENOUS | Status: DC
  Administered 2016-07-22: 3650 mg via INTRAVENOUS
  Filled 2016-07-22: qty 73

## 2016-07-22 MED ORDER — DIPHENHYDRAMINE HCL 50 MG/ML IJ SOLN
INTRAMUSCULAR | Status: AC
  Filled 2016-07-22: qty 1

## 2016-07-22 MED ORDER — DEXAMETHASONE SODIUM PHOSPHATE 10 MG/ML IJ SOLN
10.0000 mg | Freq: Once | INTRAMUSCULAR | Status: AC
  Administered 2016-07-22: 10 mg via INTRAVENOUS

## 2016-07-22 MED ORDER — DIPHENHYDRAMINE HCL 50 MG/ML IJ SOLN
50.0000 mg | Freq: Once | INTRAMUSCULAR | Status: AC
  Administered 2016-07-22: 50 mg via INTRAVENOUS

## 2016-07-22 MED ORDER — SODIUM CHLORIDE 0.9 % IV SOLN
Freq: Once | INTRAVENOUS | Status: AC
Start: 2016-07-22 — End: 2016-07-22
  Administered 2016-07-22: 12:00:00 via INTRAVENOUS

## 2016-07-22 MED ORDER — IRINOTECAN HCL CHEMO INJECTION 100 MG/5ML
90.0000 mg/m2 | Freq: Once | INTRAVENOUS | Status: AC
  Administered 2016-07-22: 220 mg via INTRAVENOUS
  Filled 2016-07-22: qty 10

## 2016-07-22 MED ORDER — SODIUM CHLORIDE 0.9 % IV SOLN: 10.0000 mg | Freq: Once | INTRAVENOUS | Status: DC

## 2016-07-22 MED ORDER — ATROPINE SULFATE 1 MG/ML IJ SOLN
0.5000 mg | Freq: Once | INTRAMUSCULAR | Status: AC | PRN
  Administered 2016-07-22: 0.5 mg via INTRAVENOUS
  Filled 2016-07-22: qty 1

## 2016-07-22 MED ORDER — PALONOSETRON HCL INJECTION 0.25 MG/5ML
0.2500 mg | Freq: Once | INTRAVENOUS | Status: AC
  Administered 2016-07-22: 0.25 mg via INTRAVENOUS
  Filled 2016-07-22: qty 5

## 2016-07-22 MED ORDER — FLUOROURACIL CHEMO INJECTION 2.5 GM/50ML
300.0000 mg/m2 | Freq: Once | INTRAVENOUS | Status: AC
  Administered 2016-07-22: 750 mg via INTRAVENOUS
  Filled 2016-07-22: qty 15

## 2016-07-22 MED ORDER — LEUCOVORIN CALCIUM INJECTION 350 MG
400.0000 mg/m2 | Freq: Once | INTRAVENOUS | Status: AC
  Administered 2016-07-22: 972 mg via INTRAVENOUS
  Filled 2016-07-22: qty 48.6

## 2016-07-22 MED ORDER — DEXAMETHASONE SODIUM PHOSPHATE 10 MG/ML IJ SOLN
INTRAMUSCULAR | Status: AC
  Filled 2016-07-22: qty 1

## 2016-07-22 MED ORDER — CETUXIMAB CHEMO IV INJECTION 200 MG/100ML
500.0000 mg/m2 | Freq: Once | INTRAVENOUS | Status: AC
  Administered 2016-07-22: 1200 mg via INTRAVENOUS
  Filled 2016-07-22: qty 600

## 2016-07-22 NOTE — Progress Notes (Signed)
Tolerated tx w/o adverse reaction.  Alert, in no distress.  VSS.  Discharged via wheelchair.

## 2016-07-22 NOTE — Patient Instructions (Signed)
[facility] Discharge Instructions for Patients Receiving Chemotherapy   Beginning January 23rd 2017 lab work for the [facility] will be done in the  Main lab at [facility] on 1st floor. If you have a lab appointment with the [place] please come in thru the  Main Entrance and check in at the main information desk   Today you received the following chemotherapy agents:  Irinotecan, leucovorin, and 5FU.  To help prevent nausea and vomiting after your treatment, we encourage you to take your nausea medication as prescribed.  If you develop nausea and vomiting, or diarrhea that is not controlled by your medication, call the clinic.  The clinic phone number is [phone]. Office hours are Monday-Friday 8:30am-5:00pm.  BELOW ARE SYMPTOMS THAT SHOULD BE REPORTED IMMEDIATELY:  *FEVER GREATER THAN 101.0 F  *CHILLS WITH OR WITHOUT FEVER  NAUSEA AND VOMITING THAT IS NOT CONTROLLED WITH YOUR NAUSEA MEDICATION  *UNUSUAL SHORTNESS OF BREATH  *UNUSUAL BRUISING OR BLEEDING  TENDERNESS IN MOUTH AND THROAT WITH OR WITHOUT PRESENCE OF ULCERS  *URINARY PROBLEMS  *BOWEL PROBLEMS  UNUSUAL RASH Items with * indicate a potential emergency and should be followed up as soon as possible. If you have an emergency after office hours please contact your primary care physician or go to the nearest emergency department.  Please call the clinic during office hours if you have any questions or concerns.   You may also contact the Patient Navigator at [phone] should you have any questions or need assistance in obtaining follow up care.      Resources For Cancer Patients and their Caregivers ? American Cancer Society: Can assist with transportation, wigs, general needs, runs Look Good Feel Better.        684-886-7390 ? Cancer Care: Provides financial assistance, online support groups, medication/co-pay assistance.  1-800-813-HOPE (313)864-3343) ? [place] Assists [place] cancer patients and their families through emotional , educational and financial support.  707-880-6159 ? [name] DSS Where to apply for food stamps, Medicaid and utility assistance. 843-190-8325 ? [name]: Transportation to medical appointments. 380-209-7057 ? Social Security Administration: May apply for disability if have a Stage IV cancer. [phone] ? [employer], Disability and Transit Services: Assists with nutrition, care and transit needs. [phone]

## 2016-07-23 ENCOUNTER — Encounter (HOSPITAL_BASED_OUTPATIENT_CLINIC_OR_DEPARTMENT_OTHER): Payer: Medicare Other

## 2016-07-23 ENCOUNTER — Encounter (HOSPITAL_COMMUNITY): Payer: Self-pay

## 2016-07-23 VITALS — BP 129/54 | HR 80 | Temp 98.8°F | Resp 18

## 2016-07-23 DIAGNOSIS — C180 Malignant neoplasm of cecum: Secondary | ICD-10-CM

## 2016-07-23 DIAGNOSIS — Z452 Encounter for adjustment and management of vascular access device: Secondary | ICD-10-CM | POA: Diagnosis not present

## 2016-07-23 LAB — CEA: CEA: 207.8 ng/mL — AB (ref 0.0–4.7)

## 2016-07-23 MED ORDER — SODIUM CHLORIDE 0.9% FLUSH
10.0000 mL | INTRAVENOUS | Status: DC | PRN
  Administered 2016-07-23: 10 mL
  Filled 2016-07-23: qty 10

## 2016-07-23 MED ORDER — HEPARIN SOD (PORK) LOCK FLUSH 100 UNIT/ML IV SOLN
500.0000 [IU] | Freq: Once | INTRAVENOUS | Status: AC | PRN
  Administered 2016-07-23: 500 [IU]
  Filled 2016-07-23: qty 5

## 2016-07-23 NOTE — Progress Notes (Signed)
Nathaniel Johnston returns today for port de access and flush after 46 hr continous infusion of 110fu. Tolerated infusion without problems. Portacath located left chest wall was  deaccessed and flushed with 56ml NS and 500U/72ml Heparin and needle removed intact.  Procedure without incident. Patient tolerated procedure well. Vitals stable and discharged from clinic via wheelchair

## 2016-07-23 NOTE — Patient Instructions (Signed)
Nathaniel Johnston at [facility] Discharge Instructions  RECOMMENDATIONS MADE BY THE CONSULTANT AND ANY TEST RESULTS WILL BE SENT TO YOUR REFERRING PHYSICIAN.  Continuous 5FU pump disconnected  Follow up as scheduled.  Thank you for choosing [name] at [name] to provide your oncology and hematology care.  To afford each patient quality time with our provider, please arrive at least 15 minutes before your scheduled appointment time.   Beginning January 23rd 2017 lab work for the [name] will be done in the  Main lab at [facility] on 1st floor. If you have a lab appointment with the [name] please come in thru the  Main Entrance and check in at the main information desk  You need to re-schedule your appointment should you arrive 10 or more minutes late.  We strive to give you quality time with our providers, and arriving late affects you and other patients whose appointments are after yours.  Also, if you no show three or more times for appointments you may be dismissed from the clinic at the providers discretion.     Again, thank you for choosing [name].  Our hope is that these requests will decrease the amount of time that you wait before being seen by our physicians.       _____________________________________________________________  Should you have questions after your visit to [name], please contact our office at [phone] between the hours of 8:30 a.m. and 4:30 p.m.  Voicemails left after 4:30 p.m. will not be returned until the following business day.  For prescription refill requests, have your pharmacy contact our office.         Resources For Cancer Patients and their Caregivers ? American Cancer Society: Can assist with transportation, wigs, general needs, runs Look Good Feel Better.        ([phone] ? Cancer Care: Provides financial assistance, online support groups,  medication/co-pay assistance.  1-800-813-HOPE (628)484-0292) ? [name] Assists [name] cancer patients and their families through emotional , educational and financial support.  831-373-6198 ? [name] DSS Where to apply for food stamps, Medicaid and utility assistance. 223-834-4999 ? [name]: Transportation to medical appointments. 629-014-6820 ? Social Security Administration: May apply for disability if have a Stage IV cancer. 281-868-5002 346-547-8904 ? [employer], Disability and Transit Services: Assists with nutrition, care and transit needs. [name] Support Programs: @10RELATIVEDAYS @ > Cancer Support Group  2nd Tuesday of the month 1pm-2pm, [name]  > Creative Journey  3rd Tuesday of the month 1130am-1pm, [name]  > Look Good Feel Better  1st Wednesday of the month 10am-12 noon, [name] (Call [name] to register [phone])

## 2016-07-27 ENCOUNTER — Telehealth (HOSPITAL_COMMUNITY): Payer: Self-pay | Admitting: *Deleted

## 2016-07-28 ENCOUNTER — Other Ambulatory Visit (HOSPITAL_COMMUNITY): Payer: Self-pay | Admitting: Oncology

## 2016-07-28 ENCOUNTER — Ambulatory Visit (HOSPITAL_COMMUNITY)
Admission: RE | Admit: 2016-07-28 | Discharge: 2016-07-28 | Disposition: A | Discharge status: Home / Self Care | Payer: Medicare Other | Source: Ambulatory Visit | Attending: Hematology & Oncology | Admitting: Hematology & Oncology

## 2016-07-28 ENCOUNTER — Encounter (HOSPITAL_COMMUNITY): Payer: Self-pay

## 2016-07-28 DIAGNOSIS — K8020 Calculus of gallbladder without cholecystitis without obstruction: Secondary | ICD-10-CM | POA: Diagnosis not present

## 2016-07-28 DIAGNOSIS — I700 Atherosclerosis of aorta: Secondary | ICD-10-CM | POA: Insufficient documentation

## 2016-07-28 DIAGNOSIS — I2510 Atherosclerotic heart disease of native coronary artery without angina pectoris: Secondary | ICD-10-CM | POA: Insufficient documentation

## 2016-07-28 DIAGNOSIS — K5790 Diverticulosis of intestine, part unspecified, without perforation or abscess without bleeding: Secondary | ICD-10-CM | POA: Insufficient documentation

## 2016-07-28 DIAGNOSIS — M5136 Other intervertebral disc degeneration, lumbar region: Secondary | ICD-10-CM | POA: Diagnosis not present

## 2016-07-28 DIAGNOSIS — C180 Malignant neoplasm of cecum: Secondary | ICD-10-CM

## 2016-07-28 DIAGNOSIS — N186 End stage renal disease: Secondary | ICD-10-CM

## 2016-07-28 DIAGNOSIS — Z9049 Acquired absence of other specified parts of digestive tract: Secondary | ICD-10-CM | POA: Diagnosis not present

## 2016-07-28 DIAGNOSIS — N62 Hypertrophy of breast: Secondary | ICD-10-CM | POA: Insufficient documentation

## 2016-07-28 DIAGNOSIS — R918 Other nonspecific abnormal finding of lung field: Secondary | ICD-10-CM | POA: Insufficient documentation

## 2016-07-28 DIAGNOSIS — R590 Localized enlarged lymph nodes: Secondary | ICD-10-CM | POA: Diagnosis not present

## 2016-07-28 DIAGNOSIS — C772 Secondary and unspecified malignant neoplasm of intra-abdominal lymph nodes: Secondary | ICD-10-CM | POA: Diagnosis not present

## 2016-07-28 DIAGNOSIS — C189 Malignant neoplasm of colon, unspecified: Secondary | ICD-10-CM

## 2016-07-28 HISTORY — DX: Dependence on renal dialysis: Z99.2

## 2016-07-28 MED ORDER — IOPAMIDOL (ISOVUE-300) INJECTION 61%
100.0000 mL | Freq: Once | INTRAVENOUS | Status: AC | PRN
  Administered 2016-07-28: 100 mL via INTRAVENOUS

## 2016-07-28 MED ORDER — HEPARIN SOD (PORK) LOCK FLUSH 100 UNIT/ML IV SOLN
INTRAVENOUS | Status: AC
  Filled 2016-07-28: qty 5

## 2016-07-28 MED ORDER — HEPARIN SOD (PORK) LOCK FLUSH 100 UNIT/ML IV SOLN
INTRAVENOUS | Status: AC
  Administered 2016-07-28: 500 [IU]
  Filled 2016-07-28: qty 5

## 2016-07-28 NOTE — Telephone Encounter (Signed)
Call social worker back Rodena Medin, faxed a verbal order for this patient for HHA, skilled nursing wound care to bilateral legs,medication management and social worker to evaluate home issues.

## 2016-07-29 ENCOUNTER — Telehealth (HOSPITAL_COMMUNITY): Payer: Self-pay | Admitting: *Deleted

## 2016-07-29 NOTE — Telephone Encounter (Signed)
Fax was sent

## 2016-08-01 ENCOUNTER — Encounter (HOSPITAL_COMMUNITY): Payer: Self-pay | Admitting: Hematology & Oncology

## 2016-08-05 ENCOUNTER — Encounter (HOSPITAL_BASED_OUTPATIENT_CLINIC_OR_DEPARTMENT_OTHER): Payer: Medicare Other | Admitting: Oncology

## 2016-08-05 ENCOUNTER — Encounter (HOSPITAL_COMMUNITY): Payer: Self-pay | Admitting: Emergency Medicine

## 2016-08-05 ENCOUNTER — Encounter (HOSPITAL_COMMUNITY): Payer: Medicare Other | Attending: Oncology

## 2016-08-05 ENCOUNTER — Encounter (HOSPITAL_COMMUNITY): Payer: Self-pay | Admitting: Oncology

## 2016-08-05 VITALS — BP 126/70 | HR 78 | Temp 98.5°F | Wt 241.6 lb

## 2016-08-05 DIAGNOSIS — C772 Secondary and unspecified malignant neoplasm of intra-abdominal lymph nodes: Secondary | ICD-10-CM

## 2016-08-05 DIAGNOSIS — Z5112 Encounter for antineoplastic immunotherapy: Secondary | ICD-10-CM

## 2016-08-05 DIAGNOSIS — C189 Malignant neoplasm of colon, unspecified: Secondary | ICD-10-CM

## 2016-08-05 DIAGNOSIS — C180 Malignant neoplasm of cecum: Secondary | ICD-10-CM

## 2016-08-05 DIAGNOSIS — N186 End stage renal disease: Secondary | ICD-10-CM

## 2016-08-05 DIAGNOSIS — E876 Hypokalemia: Secondary | ICD-10-CM | POA: Diagnosis not present

## 2016-08-05 LAB — COMPREHENSIVE METABOLIC PANEL
ALBUMIN: 3.3 g/dL — AB (ref 3.5–5.0)
ALK PHOS: 54 U/L (ref 38–126)
ALT: 14 U/L — AB (ref 17–63)
AST: 20 U/L (ref 15–41)
Anion gap: 8 (ref 5–15)
BUN: 16 mg/dL (ref 6–20)
CALCIUM: 8.6 mg/dL — AB (ref 8.9–10.3)
CHLORIDE: 99 mmol/L — AB (ref 101–111)
CO2: 31 mmol/L (ref 22–32)
CREATININE: 4.5 mg/dL — AB (ref 0.61–1.24)
GFR calc Af Amer: 15 mL/min — ABNORMAL LOW (ref >60)
GFR calc non Af Amer: 13 mL/min — ABNORMAL LOW (ref >60)
GLUCOSE: 116 mg/dL — AB (ref 65–99)
Potassium: 3.1 mmol/L — ABNORMAL LOW (ref 3.5–5.1)
SODIUM: 138 mmol/L (ref 135–145)
Total Bilirubin: 0.4 mg/dL (ref 0.3–1.2)
Total Protein: 6.5 g/dL (ref 6.5–8.1)

## 2016-08-05 LAB — CBC WITH DIFFERENTIAL/PLATELET
BASOS ABS: 0 10*3/uL (ref 0.0–0.1)
BASOS PCT: 0 %
EOS ABS: 0.2 10*3/uL (ref 0.0–0.7)
EOS PCT: 5 %
HCT: 27.9 % — ABNORMAL LOW (ref 39.0–52.0)
HEMOGLOBIN: 8.8 g/dL — AB (ref 13.0–17.0)
LYMPHS ABS: 1 10*3/uL (ref 0.7–4.0)
Lymphocytes Relative: 23 %
MCH: 30.1 pg (ref 26.0–34.0)
MCHC: 31.5 g/dL (ref 30.0–36.0)
MCV: 95.5 fL (ref 78.0–100.0)
Monocytes Absolute: 0.4 10*3/uL (ref 0.1–1.0)
Monocytes Relative: 8 %
NEUTROS PCT: 64 %
Neutro Abs: 2.8 10*3/uL (ref 1.7–7.7)
PLATELETS: 324 10*3/uL (ref 150–400)
RBC: 2.92 MIL/uL — AB (ref 4.22–5.81)
RDW: 16 % — ABNORMAL HIGH (ref 11.5–15.5)
WBC: 4.4 10*3/uL (ref 4.0–10.5)

## 2016-08-05 LAB — MAGNESIUM: Magnesium: 2.1 mg/dL (ref 1.7–2.4)

## 2016-08-05 MED ORDER — SODIUM CHLORIDE 0.9 % IV SOLN
Freq: Once | INTRAVENOUS | Status: AC
  Administered 2016-08-05: 12:00:00 via INTRAVENOUS

## 2016-08-05 MED ORDER — FLUOROURACIL CHEMO INJECTION 2.5 GM/50ML
300.0000 mg/m2 | Freq: Once | INTRAVENOUS | Status: AC
  Administered 2016-08-05: 750 mg via INTRAVENOUS
  Filled 2016-08-05: qty 15

## 2016-08-05 MED ORDER — LEUCOVORIN CALCIUM INJECTION 350 MG
400.0000 mg/m2 | Freq: Once | INTRAVENOUS | Status: AC
  Administered 2016-08-05: 972 mg via INTRAVENOUS
  Filled 2016-08-05: qty 48.6

## 2016-08-05 MED ORDER — ATROPINE SULFATE 1 MG/ML IJ SOLN
0.5000 mg | Freq: Once | INTRAMUSCULAR | Status: AC | PRN
  Administered 2016-08-05: 0.5 mg via INTRAVENOUS
  Filled 2016-08-05: qty 1

## 2016-08-05 MED ORDER — PALONOSETRON HCL INJECTION 0.25 MG/5ML
0.2500 mg | Freq: Once | INTRAVENOUS | Status: AC
  Administered 2016-08-05: 0.25 mg via INTRAVENOUS
  Filled 2016-08-05: qty 5

## 2016-08-05 MED ORDER — DEXAMETHASONE SODIUM PHOSPHATE 10 MG/ML IJ SOLN
10.0000 mg | Freq: Once | INTRAMUSCULAR | Status: AC
  Administered 2016-08-05: 10 mg via INTRAVENOUS
  Filled 2016-08-05: qty 1

## 2016-08-05 MED ORDER — DIPHENHYDRAMINE HCL 50 MG/ML IJ SOLN
50.0000 mg | Freq: Once | INTRAMUSCULAR | Status: AC
  Administered 2016-08-05: 50 mg via INTRAVENOUS
  Filled 2016-08-05: qty 1

## 2016-08-05 MED ORDER — SODIUM CHLORIDE 0.9% FLUSH
10.0000 mL | INTRAVENOUS | Status: DC | PRN
  Administered 2016-08-05: 10 mL
  Filled 2016-08-05: qty 10

## 2016-08-05 MED ORDER — SODIUM CHLORIDE 0.9 % IV SOLN
Freq: Once | INTRAVENOUS | Status: AC
  Administered 2016-08-05: 14:00:00 via INTRAVENOUS

## 2016-08-05 MED ORDER — SODIUM CHLORIDE 0.9 % IV SOLN
1500.0000 mg/m2 | INTRAVENOUS | Status: DC
  Administered 2016-08-05: 3650 mg via INTRAVENOUS
  Filled 2016-08-05: qty 73

## 2016-08-05 MED ORDER — CETUXIMAB CHEMO IV INJECTION 200 MG/100ML
500.0000 mg/m2 | Freq: Once | INTRAVENOUS | Status: AC
  Administered 2016-08-05: 1200 mg via INTRAVENOUS
  Filled 2016-08-05: qty 100

## 2016-08-05 MED ORDER — IRINOTECAN HCL CHEMO INJECTION 100 MG/5ML
90.0000 mg/m2 | Freq: Once | INTRAVENOUS | Status: AC
  Administered 2016-08-05: 220 mg via INTRAVENOUS
  Filled 2016-08-05: qty 10

## 2016-08-05 MED ORDER — SODIUM CHLORIDE 0.9 % IV SOLN: 10.0000 mg | Freq: Once | INTRAVENOUS | Status: DC

## 2016-08-05 NOTE — Progress Notes (Signed)
Waldo A Conran tolerated chemo tx well without complaints or incident.Labs reviewed prior to administering chemotherapy Pt discharged with 5FU pump infusing without issues. VSS upon discharge. Pt discharged via wheelchair in stable condition with brother

## 2016-08-05 NOTE — Progress Notes (Signed)
[facility] to request MSI testing on pts colon resection.  It has been done.  Nathaniel Johnston is going to fax the results.

## 2016-08-05 NOTE — Progress Notes (Signed)
No PCP Per Patient No address on file  Hypokalemia - Plan: potassium chloride SA (K-DUR,KLOR-CON) 20 MEQ tablet  Adenocarcinoma of cecum (HCC)  CURRENT THERAPY: FOLFIRI + Erbitux beginning on 04/29/2016  INTERVAL HISTORY: Nathaniel Johnston 60 y.o. 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    Adenocarcinoma of cecum ([place])   06/19/2014 Pathology Results    Biopsy of right colon mass positive for adenocarcinoma. He had multiple other tubular adenomas and an occasional tubulovillous adenomas seen. Cancer is K-ras mutation negative (wild type).      07/19/2014 Procedure    Colonoscopy      07/19/2014 Pathology Results    Right colon mass- invasive adenocarcinoma      07/25/2014 Surgery    Right hemicolectomy performed by Dr. Ladona Horns      07/25/2014 Pathology Results    mucinous adenocarcinoma, 6 cm in maximum dimension, arising from the cecum. positive for 19/25 nodes, +LVI, focal neural invasion, poorly differentiated with numerous extramural nodules, greater than 10      08/10/2014 Pathology Results    KRAS/NRAS mutation in negative.  No KRAS or NRAS mutations were detected in exon 2, 3, and 4. MSI STABLE      08/29/2014 - 02/20/2015 Chemotherapy    FOLFOX adjusted for renal failure and on dialysis, 12 cycles      04/02/2015 Imaging    CT abdomen pelvis-interval resolution of previously described anterior abdominal wall abscess. Ventral abdominal wall wound is predominantly healed. Small fat containing ventral abdominal wall hernia. NED.      08/18/2015 Progression         08/18/2015 PET scan    Prominent recurrence with extensive retroperitoneal hypermetabolic adenopathy, extensive adenopathy and nodularity in the right perirenal space and tracking in the right peritoneum. Hypermetabolic mass along the bowel anastomotic site.       09/04/2015 - 04/15/2016 Chemotherapy    FOLFOX chemotherapy  reinitiated for recurrent disease.      11/28/2015 Imaging    CT abdomen and pelvis-persistent nodularity along the right pararenal space, reduction in retroperitoneal lymphadenopathy, persistent enlarged retroperitoneal lymph node adjacent to the right kidney, no evidence of disease progression or liver metastases.      03/11/2016 Miscellaneous    Transfer of medical oncology care to [facility] (from [facility])      03/11/2016 Code Status    DNR      03/24/2016 Imaging    CT CAP- Mild increase in abdominal retroperitoneal lymphadenopathy since previous study. New mild right hydronephrosis also noted. No significant change in retroperitoneal soft tissue nodularity in right pararenal space, consistent with metastatic disease      04/29/2016 Treatment Plan Change    Change in therapy      04/29/2016 -  Chemotherapy    FOLFIRI + Erbitux      05/04/2016 - 05/07/2016 Hospital Admission    Admission diagnosis: Acute GI blood loss Additional comments: 2 units of PRBCs administered      05/13/2016 Code Status    FULL CODE      He denies any issues with loose stools/diarrhea.  He denies any new pain.  He notes that his appetite is stable, but he has lost some more weight.  We will need to monitor closely moving forward.  He is advised of his increasing CEA and concern for impending failure of treatment.  We broached goals  of care and end of life issues.  He wishes to remain a full code at this time.  He acknowledges that he has an incurable cancer and one day, if nothing else, it will cause his death.  He reports that when treatment options no longer exist, he wants to learn this information in a straightforward manner.  We broached Hospice discussion and he is agreeable to this intervention when the time comes.  Review of Systems  Constitutional: Positive for weight loss. Negative for chills and fever.  HENT: Negative.   Eyes: Negative.   Respiratory:  Negative.   Cardiovascular: Positive for leg swelling (Stable, chronic).  Gastrointestinal: Negative for abdominal pain, blood in stool, constipation, diarrhea, melena, nausea and vomiting.  Genitourinary: Negative.  Negative for dysuria and hematuria.  Musculoskeletal: Negative.   Neurological: Negative.   Endo/Heme/Allergies: Negative.   Psychiatric/Behavioral: Negative.     Past Medical History:  Diagnosis Date  . Adenocarcinoma of cecum ([place]) 03/08/2016  . Cancer ([place]) 07/2014   colon cancer surgery.  Finished Chemo 02/2015  . Chronic kidney disease   . Diabetes mellitus without complication ([place])   . Dialysis patient ([place]) 2010  . DNR (do not resuscitate) 03/11/2016  . ESRD (end stage renal disease) ([place]) 03/11/2016  . GERD (gastroesophageal reflux disease)   . High cholesterol   . Hypertension   . Stroke [facility])    2003    Past Surgical History:  Procedure Laterality Date  . AMPUTATION TOE Left 2001  . CATARACT EXTRACTION W/PHACO Right 06/16/2015   Procedure: CATARACT EXTRACTION PHACO AND INTRAOCULAR LENS PLACEMENT RIGHT EYE CDE=54.69;  Surgeon: Williams Che, MD;  Location: [facility] ORS;  Service: Ophthalmology;  Laterality: Right;  . COLECTOMY  07/2014  . DIALYSIS FISTULA CREATION Right   . ESOPHAGOGASTRODUODENOSCOPY N/A 05/06/2016   Procedure: ESOPHAGOGASTRODUODENOSCOPY (EGD);  Surgeon: Rogene Houston, MD;  Location: [facility] ENDO SUITE;  Service: Endoscopy;  Laterality: N/A;  . toe amputation Right 2000  . UPPER EXTREMITY ANGIOGRAM Right 05/03/2016   Procedure: RIGHT UPPER EXTREMITY ANGIOGRAM OF ARTERIAL VENOUS FISTULA , BALLOON ANGIOPLASTY;  Surgeon: Vickie Epley, MD;  Location: [facility] ORS;  Service: Vascular;  Laterality: Right;  . YAG LASER APPLICATION Right 98/92/1194   Procedure: YAG LASER APPLICATION;  Surgeon: Williams Che, MD;  Location: [facility] ORS;  Service: Ophthalmology;  Laterality: Right;    History reviewed. No pertinent family history.  Social History   Social  History  . Marital status: Married    Spouse name: N/A  . Number of children: N/A  . Years of education: N/A   Occupational History  . [employer]     [employer]   Social History Main Topics  . Smoking status: Former Smoker    Packs/day: 0.50    Years: 15.00    Types: Cigarettes    Quit date: 03/12/1991  . Smokeless tobacco: Never Used  . Alcohol use No  . Drug use: No  . Sexual activity: Not Asked   Other Topics Concern  . None   Social History Narrative  . None     PHYSICAL EXAMINATION  ECOG PERFORMANCE STATUS: 2 - Symptomatic, <50% confined to bed  There were no vitals filed for this visit.  Vitals - 1 value per visit 17/12/812  SYSTOLIC 481  DIASTOLIC 70  Pulse 78  Temperature 98.5  Respirations   Weight (lb) 241.6    GENERAL:alert, no distress, well nourished, well developed, comfortable, cooperative, obese, smiling and in chemo-bed. Unkempt.  SKIN: Acne-like/pustular rash on face HEAD: Normocephalic, No masses, lesions, tenderness or abnormalities EYES: normal EARS: External ears normal OROPHARYNX:mucous membranes are moist  NECK: supple, trachea midline LYMPH:  no palpable lymphadenopathy BREAST:not examined LUNGS: clear to auscultation without wheezes, rales, rhonchi.  Decreased breath sounds bilaterally. HEART: regular rate & rhythm ABDOMEN:abdomen soft and normal bowel sounds BACK: Back symmetric, no curvature. EXTREMITIES:less then 2 second capillary refill, no joint deformities, effusion, or inflammation, no skin discoloration, no cyanosis  NEURO: alert & oriented x 3 with fluent speech, no focal motor/sensory deficits, brought to the clinic in a wheelchair.   LABORATORY DATA: CBC    Component Value Date/Time   WBC 4.4 08/05/2016 1107   RBC 2.92 (L) 08/05/2016 1107   HGB 8.8 (L) 08/05/2016 1107   HCT 27.9 (L) 08/05/2016 1107   PLT 324 08/05/2016 1107   MCV 95.5 08/05/2016 1107   MCH 30.1 08/05/2016 1107   MCHC 31.5 08/05/2016  1107   RDW 16.0 (H) 08/05/2016 1107   LYMPHSABS 1.0 08/05/2016 1107   MONOABS 0.4 08/05/2016 1107   EOSABS 0.2 08/05/2016 1107   BASOSABS 0.0 08/05/2016 1107      Chemistry      Component Value Date/Time   NA 138 08/05/2016 1107   K 3.1 (L) 08/05/2016 1107   CL 99 (L) 08/05/2016 1107   CO2 31 08/05/2016 1107   BUN 16 08/05/2016 1107   CREATININE 4.50 (H) 08/05/2016 1107      Component Value Date/Time   CALCIUM 8.6 (L) 08/05/2016 1107   ALKPHOS 54 08/05/2016 1107   AST 20 08/05/2016 1107   ALT 14 (L) 08/05/2016 1107   BILITOT 0.4 08/05/2016 1107      Lab Results  Component Value Date   CEA 207.8 (H) 07/22/2016     PENDING LABS:   RADIOGRAPHIC STUDIES:  Ct Chest W Contrast  Result Date: 07/28/2016 CLINICAL DATA:  Restaging of stage IV adenocarcinoma of the cecum. Metastasis to intra abdominal lymph nodes. End-stage renal disease. On dialysis. Status post chemotherapy. EXAM: CT CHEST, ABDOMEN, AND PELVIS WITH CONTRAST TECHNIQUE: Multidetector CT imaging of the chest, abdomen and pelvis was performed following the standard protocol during bolus administration of intravenous contrast. CONTRAST:  141m ISOVUE-300 IOPAMIDOL (ISOVUE-300) INJECTION 61% COMPARISON:  03/24/2016 and chest radiograph of 07/25/2016. FINDINGS: CT CHEST FINDINGS Cardiovascular: Left-sided Port-A-Cath which terminates at the mid SVC. Aortic and branch vessel atherosclerosis. Normal heart size, without pericardial effusion. Multivessel coronary artery atherosclerosis. No central pulmonary embolism, on this non-dedicated study. Right subclavian venous stent is new. Mediastinum/Nodes: No supraclavicular adenopathy. Edema about the right chest wall and axilla is likely related to prior venous insufficiency or the presence of the stent. Mild bilateral gynecomastia, worse on the left. No mediastinal or hilar adenopathy. Posterior mediastinal node is similar and not pathologic by size criteria. Example at 9 mm on  image 32/series 2. Dilated lower esophagus with contrast level within. Example image 35/series 2. Lungs/Pleura: Trace bilateral pleural fluid. Azygos fissure. Subsegmental atelectasis or scarring in both lung bases. Musculoskeletal: No acute osseous abnormality. CT ABDOMEN PELVIS FINDINGS Hepatobiliary: Mild motion degradation in the upper abdomen. A too small to characterize right hepatic lobe lesion image 76/series 2 is felt to be similar to on the prior. Small gallstones. No acute cholecystitis. Intrahepatic ducts are borderline prominent, similar. The common duct is normal. Pancreas: Pancreatic atrophy, without dominant mass or duct dilatation. Spleen: Normal in size, without focal abnormality. Adrenals/Urinary Tract: Normal adrenal glands. Moderate bilateral renal atrophy.  Interpolar too small to characterize right renal lesion. Decreased renal function, especially on the right. Right perinephric interstitial thickening again identified. The most nodular component measures on the order of 1.6 x 2.9 cm on image 85/series 2. Compare 1.9 x 2.9 cm on the prior exam (when remeasured). Normal urinary bladder. Stomach/Bowel: Normal stomach, without wall thickening. Scattered colonic diverticula. Partial right hemicolectomy. Soft tissue thickening is identified in the region of the ileocolic anastomosis. Example image 90/series 2. This is superimposed upon and may be accentuated by underdistention. No obstruction. Vascular/Lymphatic: Aortic and branch vessel atherosclerosis. Retroperitoneal adenopathy. An index left periaortic nodal mass measures 2.6 x 3.6 cm on image 80/series 2. Compare 2.6 x 3.3 cm on the prior. More inferior left periaortic node measures 2.2 cm on image 86/series 2. Compare 1.9 cm previously. Preaortic 1.0 cm node on image 78/series 2 is enlarged from 9 mm previously. Reproductive: Normal prostate. Other: No significant free fluid. Thickening of the right lateral Conal fascia persists, including on  image 87/series 2. Left para midline tiny ventral fat containing pelvic hernia. Anterior abdominal wall laxity contains fat. Musculoskeletal: Advanced degenerative disc disease at L3-4. IMPRESSION: CT CHEST IMPRESSION 1. No acute process or evidence of metastatic disease in the chest. Similar size of a small posterior mediastinal node, not pathologic by size criteria. 2. Trace bilateral pleural fluid. 3. Esophageal air fluid level suggests dysmotility or gastroesophageal reflux. 4.  Coronary artery atherosclerosis. Aortic atherosclerosis. CT ABDOMEN AND PELVIS IMPRESSION 1. Status post right hemicolectomy. Mild soft tissue fullness in the region of the anastomosis for which locally recurrent disease or inflammation versus ischemia are differential considerations. 2. Mild progression of retroperitoneal adenopathy. 3. Similar metastasis within the right para renal space and right posterior peritoneal surface. 4. Cholelithiasis. 5.  Aortic atherosclerosis. Electronically Signed   By: Abigail Miyamoto M.D.   On: 07/28/2016 15:03   Ct Abdomen Pelvis W Contrast  Result Date: 07/28/2016 CLINICAL DATA:  Restaging of stage IV adenocarcinoma of the cecum. Metastasis to intra abdominal lymph nodes. End-stage renal disease. On dialysis. Status post chemotherapy. EXAM: CT CHEST, ABDOMEN, AND PELVIS WITH CONTRAST TECHNIQUE: Multidetector CT imaging of the chest, abdomen and pelvis was performed following the standard protocol during bolus administration of intravenous contrast. CONTRAST:  19m ISOVUE-300 IOPAMIDOL (ISOVUE-300) INJECTION 61% COMPARISON:  03/24/2016 and chest radiograph of 07/25/2016. FINDINGS: CT CHEST FINDINGS Cardiovascular: Left-sided Port-A-Cath which terminates at the mid SVC. Aortic and branch vessel atherosclerosis. Normal heart size, without pericardial effusion. Multivessel coronary artery atherosclerosis. No central pulmonary embolism, on this non-dedicated study. Right subclavian venous stent is new.  Mediastinum/Nodes: No supraclavicular adenopathy. Edema about the right chest wall and axilla is likely related to prior venous insufficiency or the presence of the stent. Mild bilateral gynecomastia, worse on the left. No mediastinal or hilar adenopathy. Posterior mediastinal node is similar and not pathologic by size criteria. Example at 9 mm on image 32/series 2. Dilated lower esophagus with contrast level within. Example image 35/series 2. Lungs/Pleura: Trace bilateral pleural fluid. Azygos fissure. Subsegmental atelectasis or scarring in both lung bases. Musculoskeletal: No acute osseous abnormality. CT ABDOMEN PELVIS FINDINGS Hepatobiliary: Mild motion degradation in the upper abdomen. A too small to characterize right hepatic lobe lesion image 76/series 2 is felt to be similar to on the prior. Small gallstones. No acute cholecystitis. Intrahepatic ducts are borderline prominent, similar. The common duct is normal. Pancreas: Pancreatic atrophy, without dominant mass or duct dilatation. Spleen: Normal in size, without focal abnormality. Adrenals/Urinary Tract: Normal  adrenal glands. Moderate bilateral renal atrophy. Interpolar too small to characterize right renal lesion. Decreased renal function, especially on the right. Right perinephric interstitial thickening again identified. The most nodular component measures on the order of 1.6 x 2.9 cm on image 85/series 2. Compare 1.9 x 2.9 cm on the prior exam (when remeasured). Normal urinary bladder. Stomach/Bowel: Normal stomach, without wall thickening. Scattered colonic diverticula. Partial right hemicolectomy. Soft tissue thickening is identified in the region of the ileocolic anastomosis. Example image 90/series 2. This is superimposed upon and may be accentuated by underdistention. No obstruction. Vascular/Lymphatic: Aortic and branch vessel atherosclerosis. Retroperitoneal adenopathy. An index left periaortic nodal mass measures 2.6 x 3.6 cm on image  80/series 2. Compare 2.6 x 3.3 cm on the prior. More inferior left periaortic node measures 2.2 cm on image 86/series 2. Compare 1.9 cm previously. Preaortic 1.0 cm node on image 78/series 2 is enlarged from 9 mm previously. Reproductive: Normal prostate. Other: No significant free fluid. Thickening of the right lateral Conal fascia persists, including on image 87/series 2. Left para midline tiny ventral fat containing pelvic hernia. Anterior abdominal wall laxity contains fat. Musculoskeletal: Advanced degenerative disc disease at L3-4. IMPRESSION: CT CHEST IMPRESSION 1. No acute process or evidence of metastatic disease in the chest. Similar size of a small posterior mediastinal node, not pathologic by size criteria. 2. Trace bilateral pleural fluid. 3. Esophageal air fluid level suggests dysmotility or gastroesophageal reflux. 4.  Coronary artery atherosclerosis. Aortic atherosclerosis. CT ABDOMEN AND PELVIS IMPRESSION 1. Status post right hemicolectomy. Mild soft tissue fullness in the region of the anastomosis for which locally recurrent disease or inflammation versus ischemia are differential considerations. 2. Mild progression of retroperitoneal adenopathy. 3. Similar metastasis within the right para renal space and right posterior peritoneal surface. 4. Cholelithiasis. 5.  Aortic atherosclerosis. Electronically Signed   By: Abigail Miyamoto M.D.   On: 07/28/2016 15:03     PATHOLOGY:    ASSESSMENT AND PLAN:  Adenocarcinoma of cecum ([place]) Stage IV adenocarcinoma of colon on PET imaging on 08/13/2015 after being diagnosed with Stage IIIC (T3N2BM0) on 07/25/2014 treated with definitive surgery followed by 6 months of adjuvant therapy consisting of FOLFOX.  Now on FOLFIRI + Erbitux  Oncology history updated.  Labs today: CBC diff, CMET, Magnesium.  I personally reviewed and went over laboratory results with the patient.  The results are noted within this dictation. Laboratory work satisfies treatment  criteria.  Anemia is noted and stable.  Hypokalemia is noted and I will defer management to his nephrologist as he is on hemodialysis.  A copy of Nathaniel Johnston's labs will be faxed to his nephrologist today.  I have asked nursing to call [facility] for MSI testing on tumor.  With his rising CEA, we are concerned for impending failure of treatment.  This was completed.  He is MSI-Stable.  I have added this to the patient's oncology history.  Weight is down some and we will need to monitor moving forward.  Return in 2 weeks for treatment and follow-up.   ORDERS PLACED FOR THIS ENCOUNTER: No orders of the defined types were placed in this encounter.   MEDICATIONS PRESCRIBED THIS ENCOUNTER: No orders of the defined types were placed in this encounter.   THERAPY PLAN:  Continue palliative treatment as outlined above.  All questions were answered. The patient knows to call the clinic with any problems, questions or concerns. We can certainly see the patient much sooner if necessary.  Patient and plan discussed  with Dr. Ancil Linsey and she is in agreement with the aforementioned.   This note is electronically signed by: Doy Mince 08/05/2016 8:21 PM

## 2016-08-05 NOTE — Assessment & Plan Note (Addendum)
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  Now on FOLFIRI + Erbitux  Oncology history updated.  Labs today: CBC diff, CMET, Magnesium.  I personally reviewed and went over laboratory results with the patient.  The results are noted within this dictation. Laboratory work satisfies treatment criteria.  Anemia is noted and stable.  Hypokalemia is noted and I will defer management to his nephrologist as he is on hemodialysis.  A copy of Nathaniel Johnston's labs will be faxed to his nephrologist today.  I have asked nursing to call [facility] for MSI testing on tumor.  With his rising CEA, we are concerned for impending failure of treatment.  This was completed.  He is MSI-Stable.  I have added this to the patient's oncology history.  Weight is down some and we will need to monitor moving forward.  Return in 2 weeks for treatment and follow-up.

## 2016-08-05 NOTE — Patient Instructions (Signed)
[place] at [facility], In Discharge Instructions  RECOMMENDATIONS MADE BY THE CONSULTANT AND ANY TEST RESULTS WILL BE SENT TO YOUR REFERRING PHYSICIAN.  You were seen today by Kirby Crigler PA-C.  Return in 2 weeks and 4 weeks for treatment and follow up.   Thank you for choosing [place] at [facility] to provide your oncology and hematology care.  To afford each patient quality time with our provider, please arrive at least 15 minutes before your scheduled appointment time.   Beginning January 23rd 2017 lab work for the [name] will be done in the  Main lab at [facility] on 1st floor. If you have a lab appointment with the [facility] please come in thru the  Main Entrance and check in at the main information desk  You need to re-schedule your appointment should you arrive 10 or more minutes late.  We strive to give you quality time with our providers, and arriving late affects you and other patients whose appointments are after yours.  Also, if you no show three or more times for appointments you may be dismissed from the clinic at the providers discretion.     Again, thank you for choosing [facility].  Our hope is that these requests will decrease the amount of time that you wait before being seen by our physicians.       _____________________________________________________________  Should you have questions after your visit to [facility] please contact our office at [phone] between the hours of 8:30 a.m. and 4:30 p.m.  Voicemails left after 4:30 p.m. will not be returned until the following business day.  For prescription refill requests, have your pharmacy contact our office.         Resources For Cancer Patients and their Caregivers ? American Cancer Society: Can assist with transportation, wigs, general needs, runs Look Good Feel Better.        613 127 5995 ? Cancer Care: Provides  financial assistance, online support groups, medication/co-pay assistance.  1-800-813-HOPE 7130210851) ? [place] Assists [place] cancer patients and their families through emotional , educational and financial support.  (725) 293-0118 ? [name] DSS Where to apply for food stamps, Medicaid and utility assistance. 314-342-4302 ? [name]: Transportation to medical appointments. 415-262-1974 ? Social Security Administration: May apply for disability if have a Stage IV cancer. 519-178-0366 857-328-9115 ? [employer], Disability and Transit Services: Assists with nutrition, care and transit needs. [place] Support Programs: @10RELATIVEDAYS @ > Cancer Support Group  2nd Tuesday of the month 1pm-2pm, [name]  > Creative Journey  3rd Tuesday of the month 1130am-1pm, [name]  > Look Good Feel Better  1st Wednesday of the month 10am-12 noon, [name] (Call [place] to register [phone])

## 2016-08-05 NOTE — Patient Instructions (Signed)
[facility] Discharge Instructions for Patients Receiving Chemotherapy   Beginning January 23rd 2017 lab work for the [facility] will be done in the  Main lab at [facility] on 1st floor. If you have a lab appointment with the [facility] please come in thru the  Main Entrance and check in at the main information desk   Today you received the following chemotherapy agents Erbitux,Irinotecan,Leucovorin and 5FU.Follow-up as scheduled. Call clinic for any questions or concerns  To help prevent nausea and vomiting after your treatment, we encourage you to take your nausea medication   If you develop nausea and vomiting, or diarrhea that is not controlled by your medication, call the clinic.  The clinic phone number is [phone]. Office hours are Monday-Friday 8:30am-5:00pm.  BELOW ARE SYMPTOMS THAT SHOULD BE REPORTED IMMEDIATELY:  *FEVER GREATER THAN 101.0 F  *CHILLS WITH OR WITHOUT FEVER  NAUSEA AND VOMITING THAT IS NOT CONTROLLED WITH YOUR NAUSEA MEDICATION  *UNUSUAL SHORTNESS OF BREATH  *UNUSUAL BRUISING OR BLEEDING  TENDERNESS IN MOUTH AND THROAT WITH OR WITHOUT PRESENCE OF ULCERS  *URINARY PROBLEMS  *BOWEL PROBLEMS  UNUSUAL RASH Items with * indicate a potential emergency and should be followed up as soon as possible. If you have an emergency after office hours please contact your primary care physician or go to the nearest emergency department.  Please call the clinic during office hours if you have any questions or concerns.   You may also contact the Patient Navigator at [phone] should you have any questions or need assistance in obtaining follow up care.      Resources For Cancer Patients and their Caregivers ? American Cancer Society: Can assist with transportation, wigs, general needs, runs Look Good Feel Better.        254-364-0137 ? Cancer Care: Provides financial assistance, online support groups, medication/co-pay  assistance.  1-800-813-HOPE 816 249 0237) ? [place] Assists [place] cancer patients and their families through emotional , educational and financial support.  (971)124-9215 ? [name] DSS Where to apply for food stamps, Medicaid and utility assistance. 909-607-2162 ? [name]: Transportation to medical appointments. (773)368-5925 ? Social Security Administration: May apply for disability if have a Stage IV cancer. (207)638-8236 813-435-2097 ? [employer], Disability and Transit Services: Assists with nutrition, care and transit needs. [phone]

## 2016-08-06 ENCOUNTER — Encounter (HOSPITAL_BASED_OUTPATIENT_CLINIC_OR_DEPARTMENT_OTHER): Payer: Medicare Other

## 2016-08-06 VITALS — BP 169/83 | HR 81 | Temp 98.5°F | Resp 18

## 2016-08-06 DIAGNOSIS — Z992 Dependence on renal dialysis: Secondary | ICD-10-CM

## 2016-08-06 DIAGNOSIS — C180 Malignant neoplasm of cecum: Secondary | ICD-10-CM

## 2016-08-06 LAB — CEA: CEA: 218.2 ng/mL — AB (ref 0.0–4.7)

## 2016-08-06 MED ORDER — HEPARIN SOD (PORK) LOCK FLUSH 100 UNIT/ML IV SOLN
INTRAVENOUS | Status: AC
  Filled 2016-08-06: qty 5

## 2016-08-06 MED ORDER — HEPARIN SOD (PORK) LOCK FLUSH 100 UNIT/ML IV SOLN
500.0000 [IU] | Freq: Once | INTRAVENOUS | Status: AC | PRN
Start: 2016-08-06 — End: 2016-08-06
  Administered 2016-08-06: 500 [IU]

## 2016-08-06 MED ORDER — SODIUM CHLORIDE 0.9% FLUSH
10.0000 mL | INTRAVENOUS | Status: DC | PRN
  Administered 2016-08-06: 10 mL
  Filled 2016-08-06: qty 10

## 2016-08-06 NOTE — Patient Instructions (Signed)
[facility] Discharge Instructions  RECOMMENDATIONS MADE BY THE CONSULTANT AND ANY TEST RESULTS WILL BE SENT TO YOUR REFERRING PHYSICIAN.  5FU pump discontinued and port flushed per protocol. Follow-up as scheduled. Call clinic for any questions or concerns  Thank you for choosing [place] at [facility] to provide your oncology and hematology care.  To afford each patient quality time with our provider, please arrive at least 15 minutes before your scheduled appointment time.   Beginning January 23rd 2017 lab work for the [name] will be done in the  Main lab at [facility] on 1st floor. If you have a lab appointment with the [facility] please come in thru the  Main Entrance and check in at the main information desk  You need to re-schedule your appointment should you arrive 10 or more minutes late.  We strive to give you quality time with our providers, and arriving late affects you and other patients whose appointments are after yours.  Also, if you no show three or more times for appointments you may be dismissed from the clinic at the providers discretion.     Again, thank you for choosing [facility].  Our hope is that these requests will decrease the amount of time that you wait before being seen by our physicians.       _____________________________________________________________  Should you have questions after your visit to [facility], please contact our office at [phone] between the hours of 8:30 a.m. and 4:30 p.m.  Voicemails left after 4:30 p.m. will not be returned until the following business day.  For prescription refill requests, have your pharmacy contact our office.         Resources For Cancer Patients and their Caregivers ? American Cancer Society: Can assist with transportation, wigs, general needs, runs Look Good Feel Better.        814-635-0816 ? Cancer  Care: Provides financial assistance, online support groups, medication/co-pay assistance.  1-800-813-HOPE 9567964823) ? [facility] Assists [place] cancer patients and their families through emotional , educational and financial support.  (226)814-8968 ? [name] DSS Where to apply for food stamps, Medicaid and utility assistance. (951)300-1514 ? [name]: Transportation to medical appointments. (205)846-5159 ? Social Security Administration: May apply for disability if have a Stage IV cancer. 978-088-6157 765 568 5974 ? [employer], Disability and Transit Services: Assists with nutrition, care and transit needs. [facility] Support Programs: @10RELATIVEDAYS @ > Cancer Support Group  2nd Tuesday of the month 1pm-2pm, [name]  > Creative Journey  3rd Tuesday of the month 1130am-1pm, [name]  > Look Good Feel Better  1st Wednesday of the month 10am-12 noon, [name] (Call [place] to register [phone])

## 2016-08-06 NOTE — Progress Notes (Signed)
Nathaniel Johnston tolerated 5FU pump well with some complaints of nausea but otherwise did well. 5FU pump discontinued with port flushed with 10 ml NS and 5 ml Heparin easily per protocol. VSS.Pt discharged via wheelchair in stable condition with family member

## 2016-08-26 ENCOUNTER — Encounter (HOSPITAL_BASED_OUTPATIENT_CLINIC_OR_DEPARTMENT_OTHER): Payer: Medicare Other

## 2016-08-26 ENCOUNTER — Encounter (HOSPITAL_BASED_OUTPATIENT_CLINIC_OR_DEPARTMENT_OTHER): Payer: Medicare Other | Admitting: Oncology

## 2016-08-26 VITALS — BP 156/71 | HR 80 | Temp 98.5°F | Resp 18

## 2016-08-26 DIAGNOSIS — Z5112 Encounter for antineoplastic immunotherapy: Secondary | ICD-10-CM | POA: Diagnosis not present

## 2016-08-26 DIAGNOSIS — Z5111 Encounter for antineoplastic chemotherapy: Secondary | ICD-10-CM

## 2016-08-26 DIAGNOSIS — C180 Malignant neoplasm of cecum: Secondary | ICD-10-CM | POA: Diagnosis not present

## 2016-08-26 DIAGNOSIS — R197 Diarrhea, unspecified: Secondary | ICD-10-CM | POA: Diagnosis not present

## 2016-08-26 LAB — MAGNESIUM: MAGNESIUM: 2 mg/dL (ref 1.7–2.4)

## 2016-08-26 LAB — CBC WITH DIFFERENTIAL/PLATELET
BASOS ABS: 0 10*3/uL (ref 0.0–0.1)
BASOS PCT: 0 %
Eosinophils Absolute: 0.3 10*3/uL (ref 0.0–0.7)
Eosinophils Relative: 5 %
HEMATOCRIT: 28.3 % — AB (ref 39.0–52.0)
Hemoglobin: 8.7 g/dL — ABNORMAL LOW (ref 13.0–17.0)
Lymphocytes Relative: 23 %
Lymphs Abs: 1.4 10*3/uL (ref 0.7–4.0)
MCH: 29.3 pg (ref 26.0–34.0)
MCHC: 30.7 g/dL (ref 30.0–36.0)
MCV: 95.3 fL (ref 78.0–100.0)
MONO ABS: 0.5 10*3/uL (ref 0.1–1.0)
Monocytes Relative: 9 %
NEUTROS ABS: 3.6 10*3/uL (ref 1.7–7.7)
NEUTROS PCT: 63 %
Platelets: 346 10*3/uL (ref 150–400)
RBC: 2.97 MIL/uL — ABNORMAL LOW (ref 4.22–5.81)
RDW: 18.1 % — AB (ref 11.5–15.5)
WBC: 5.8 10*3/uL (ref 4.0–10.5)

## 2016-08-26 LAB — COMPREHENSIVE METABOLIC PANEL
ALT: 13 U/L — AB (ref 17–63)
AST: 18 U/L (ref 15–41)
Albumin: 3.1 g/dL — ABNORMAL LOW (ref 3.5–5.0)
Alkaline Phosphatase: 52 U/L (ref 38–126)
Anion gap: 8 (ref 5–15)
BILIRUBIN TOTAL: 0.1 mg/dL — AB (ref 0.3–1.2)
BUN: 15 mg/dL (ref 6–20)
CHLORIDE: 99 mmol/L — AB (ref 101–111)
CO2: 31 mmol/L (ref 22–32)
CREATININE: 3.88 mg/dL — AB (ref 0.61–1.24)
Calcium: 8.6 mg/dL — ABNORMAL LOW (ref 8.9–10.3)
GFR calc Af Amer: 18 mL/min — ABNORMAL LOW (ref >60)
GFR, EST NON AFRICAN AMERICAN: 15 mL/min — AB (ref >60)
GLUCOSE: 56 mg/dL — AB (ref 65–99)
Potassium: 4.2 mmol/L (ref 3.5–5.1)
Sodium: 138 mmol/L (ref 135–145)
Total Protein: 6.9 g/dL (ref 6.5–8.1)

## 2016-08-26 MED ORDER — DIPHENHYDRAMINE HCL 50 MG/ML IJ SOLN
50.0000 mg | Freq: Once | INTRAMUSCULAR | Status: AC
  Administered 2016-08-26: 50 mg via INTRAVENOUS

## 2016-08-26 MED ORDER — DEXAMETHASONE SODIUM PHOSPHATE 10 MG/ML IJ SOLN
10.0000 mg | Freq: Once | INTRAMUSCULAR | Status: AC
  Administered 2016-08-26: 10 mg via INTRAVENOUS

## 2016-08-26 MED ORDER — PALONOSETRON HCL INJECTION 0.25 MG/5ML
0.2500 mg | Freq: Once | INTRAVENOUS | Status: AC
  Administered 2016-08-26: 0.25 mg via INTRAVENOUS

## 2016-08-26 MED ORDER — LOPERAMIDE HCL 2 MG PO CAPS: ORAL_CAPSULE | ORAL | 0 refills | Status: AC

## 2016-08-26 MED ORDER — SODIUM CHLORIDE 0.9 % IV SOLN: 10.0000 mg | Freq: Once | INTRAVENOUS | Status: DC

## 2016-08-26 MED ORDER — ATROPINE SULFATE 1 MG/ML IJ SOLN
0.5000 mg | Freq: Once | INTRAMUSCULAR | Status: AC | PRN
  Administered 2016-08-26: 0.5 mg via INTRAVENOUS
  Filled 2016-08-26: qty 1

## 2016-08-26 MED ORDER — SODIUM CHLORIDE 0.9 % IV SOLN
1500.0000 mg/m2 | INTRAVENOUS | Status: DC
  Administered 2016-08-26: 3650 mg via INTRAVENOUS
  Filled 2016-08-26: qty 73

## 2016-08-26 MED ORDER — CETUXIMAB CHEMO IV INJECTION 200 MG/100ML
500.0000 mg/m2 | Freq: Once | INTRAVENOUS | Status: AC
  Administered 2016-08-26: 1200 mg via INTRAVENOUS
  Filled 2016-08-26: qty 200

## 2016-08-26 MED ORDER — DIPHENHYDRAMINE HCL 50 MG/ML IJ SOLN
INTRAMUSCULAR | Status: AC
  Filled 2016-08-26: qty 1

## 2016-08-26 MED ORDER — SODIUM CHLORIDE 0.9 % IV SOLN
Freq: Once | INTRAVENOUS | Status: AC
  Administered 2016-08-26: 12:00:00 via INTRAVENOUS

## 2016-08-26 MED ORDER — PALONOSETRON HCL INJECTION 0.25 MG/5ML
INTRAVENOUS | Status: AC
  Filled 2016-08-26: qty 5

## 2016-08-26 MED ORDER — FLUOROURACIL CHEMO INJECTION 2.5 GM/50ML
300.0000 mg/m2 | Freq: Once | INTRAVENOUS | Status: AC
  Administered 2016-08-26: 750 mg via INTRAVENOUS
  Filled 2016-08-26: qty 15

## 2016-08-26 MED ORDER — LEUCOVORIN CALCIUM INJECTION 350 MG
400.0000 mg/m2 | Freq: Once | INTRAMUSCULAR | Status: AC
  Administered 2016-08-26: 972 mg via INTRAVENOUS
  Filled 2016-08-26: qty 48.6

## 2016-08-26 MED ORDER — DIPHENOXYLATE-ATROPINE 2.5-0.025 MG PO TABS: 1.0000 | ORAL_TABLET | Freq: Four times a day (QID) | ORAL | 0 refills | Status: AC | PRN

## 2016-08-26 MED ORDER — DEXAMETHASONE SODIUM PHOSPHATE 10 MG/ML IJ SOLN
INTRAMUSCULAR | Status: AC
  Filled 2016-08-26: qty 1

## 2016-08-26 MED ORDER — IRINOTECAN HCL CHEMO INJECTION 100 MG/5ML
90.0000 mg/m2 | Freq: Once | INTRAVENOUS | Status: AC
  Administered 2016-08-26: 220 mg via INTRAVENOUS
  Filled 2016-08-26: qty 5

## 2016-08-26 NOTE — Progress Notes (Signed)
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 He had multiple other tubular adenomas and an occasional tubulovillous adenomas seen. Cancer is K-ras mutation negative (wild type).      07/19/2014 Procedure    Colonoscopy      07/19/2014 Pathology Results    Right colon mass- invasive adenocarcinoma      07/25/2014 Surgery    Right hemicolectomy performed by Dr. Ladona Horns      07/25/2014 Pathology Results    mucinous adenocarcinoma, 6 cm in maximum dimension, arising from the cecum. positive for 19/25 nodes, +LVI, focal neural invasion, poorly differentiated with numerous extramural nodules, greater than 10      08/10/2014 Pathology Results    KRAS/NRAS mutation in negative.  No KRAS or NRAS mutations were detected in exon 2, 3, and 4. MSI STABLE      08/29/2014 - 02/20/2015 Chemotherapy    FOLFOX adjusted for renal failure and on dialysis, 12 cycles      04/02/2015 Imaging    CT abdomen pelvis-interval resolution of previously described anterior abdominal wall abscess. Ventral abdominal wall wound is predominantly healed. Small fat containing ventral abdominal wall hernia. NED.      08/18/2015 Progression         08/18/2015 PET scan    Prominent recurrence with extensive retroperitoneal hypermetabolic adenopathy, extensive adenopathy and nodularity in the right perirenal space and tracking in the right peritoneum. Hypermetabolic mass along the bowel  anastomotic site.       09/04/2015 - 04/15/2016 Chemotherapy    FOLFOX chemotherapy reinitiated for recurrent disease.      11/28/2015 Imaging    CT abdomen and pelvis-persistent nodularity along the right pararenal space, reduction in retroperitoneal lymphadenopathy, persistent enlarged retroperitoneal lymph node adjacent to the right kidney, no evidence of disease progression or liver metastases.      03/11/2016 Miscellaneous    Transfer of medical oncology care to [facility] (from [facility])      03/11/2016 Code Status    DNR      03/24/2016 Imaging    CT CAP- Mild increase in abdominal retroperitoneal lymphadenopathy since previous study. New mild right hydronephrosis also noted. No significant change in retroperitoneal soft tissue nodularity in right pararenal space, consistent with metastatic disease      04/29/2016 Treatment Plan Change    Change in therapy      04/29/2016 -  Chemotherapy    FOLFIRI + Erbitux      05/04/2016 - 05/07/2016 Hospital Admission    Admission diagnosis: Acute GI blood loss Additional comments: 2 units of PRBCs administered      05/13/2016 Code Status    FULL CODE       He reports that he feels well.  He denies any complaints.  He denies any diarrhea or loose stools.  He denies any rash.  He denies any nausea or vomiting.  Review of Systems  Constitutional: Negative.  Negative for chills, fever and weight loss.  HENT: Negative.   Eyes: Negative.   Respiratory: Negative.   Cardiovascular: Positive for leg swelling (Stable, chronic).  Gastrointestinal: Negative for abdominal pain, blood in stool, constipation, diarrhea, melena, nausea and vomiting.  Genitourinary: Negative.  Negative for dysuria and hematuria.  Musculoskeletal: Negative.   Skin: Negative.  Negative for rash.  Neurological: Negative.   Endo/Heme/Allergies: Negative.   Psychiatric/Behavioral: Negative.     Past Medical History:  Diagnosis  Date  . 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 Cancer ([place]) 07/2014   colon cancer surgery.  Finished Chemo 02/2015  . Chronic kidney disease   . Diabetes mellitus without complication ([place])   . Dialysis patient ([place]) 2010  . DNR (do not resuscitate) 03/11/2016  . ESRD (end stage renal disease) ([place]) 03/11/2016  . GERD (gastroesophageal reflux disease)   . High cholesterol   . Hypertension   . Stroke [facility])    2003    Past Surgical History:  Procedure Laterality Date  . AMPUTATION TOE Left 2001  . CATARACT EXTRACTION W/PHACO Right 06/16/2015   Procedure: CATARACT EXTRACTION PHACO AND INTRAOCULAR LENS PLACEMENT RIGHT EYE CDE=54.69;  Surgeon: Williams Che, MD;  Location: [facility] ORS;  Service: Ophthalmology;  Laterality: Right;  . COLECTOMY  07/2014  . DIALYSIS FISTULA CREATION Right   . ESOPHAGOGASTRODUODENOSCOPY N/A 05/06/2016   Procedure: ESOPHAGOGASTRODUODENOSCOPY (EGD);  Surgeon: Rogene Houston, MD;  Location: [facility] ENDO SUITE;  Service: Endoscopy;  Laterality: N/A;  . toe amputation Right 2000  . UPPER EXTREMITY ANGIOGRAM Right 05/03/2016   Procedure: RIGHT UPPER EXTREMITY ANGIOGRAM OF ARTERIAL VENOUS FISTULA , BALLOON ANGIOPLASTY;  Surgeon: Vickie Epley, MD;  Location: [facility] ORS;  Service: Vascular;  Laterality: Right;  . YAG LASER APPLICATION Right 57/32/2025   Procedure: YAG LASER APPLICATION;  Surgeon: Williams Che, MD;  Location: [facility] ORS;  Service: Ophthalmology;  Laterality: Right;    No family history on file.  Social History   Social History  . Marital status: Married    Spouse name: N/A  . Number of children: N/A  . Years of education: N/A   Occupational History  . [employer]     [employer]   Social History Main Topics  . Smoking status: Former Smoker    Packs/day: 0.50    Years: 15.00    Types: Cigarettes    Quit date: 03/12/1991  . Smokeless tobacco: Never Used  . Alcohol use No  . Drug use: No  . Sexual activity: Not on file   Other  Topics Concern  . Not on file   Social History Narrative  . No narrative on file     PHYSICAL EXAMINATION  ECOG PERFORMANCE STATUS: 2 - Symptomatic, <50% confined to bed  There were no vitals filed for this visit.  Vitals - 1 value per visit 42/70/6237  SYSTOLIC 628  DIASTOLIC 71  Pulse 80  Temperature 98.5  Respirations 18    GENERAL:alert, no distress, well nourished, well developed, comfortable, cooperative, obese, smiling and in chemo-bed. Unkempt.  SKIN: Acne-like/pustular rash on face, healing HEAD: Normocephalic, No masses, lesions, tenderness or abnormalities EYES: normal EARS: External ears normal OROPHARYNX:mucous membranes are moist  NECK: supple, trachea midline LYMPH:  no palpable lymphadenopathy BREAST:not examined LUNGS: clear to auscultation without wheezes, rales, rhonchi.  Decreased breath sounds bilaterally. HEART: regular rate & rhythm ABDOMEN:abdomen soft and normal bowel sounds BACK: Back symmetric, no curvature. EXTREMITIES:less then 2 second capillary refill, no joint deformities,  effusion, or inflammation, no skin discoloration, no cyanosis  NEURO: alert & oriented x 3 with fluent speech, no focal motor/sensory deficits, brought to the clinic in a wheelchair.   LABORATORY DATA: CBC    Component Value Date/Time   WBC 5.8 08/26/2016 1058   RBC 2.97 (L) 08/26/2016 1058   HGB 8.7 (L) 08/26/2016 1058   HCT 28.3 (L) 08/26/2016 1058   PLT 346 08/26/2016 1058   MCV 95.3 08/26/2016 1058   MCH 29.3 08/26/2016 1058   MCHC 30.7 08/26/2016 1058   RDW 18.1 (H) 08/26/2016 1058   LYMPHSABS 1.4 08/26/2016 1058   MONOABS 0.5 08/26/2016 1058   EOSABS 0.3 08/26/2016 1058   BASOSABS 0.0 08/26/2016 1058      Chemistry      Component Value Date/Time   NA 138 08/26/2016 1058   K 4.2 08/26/2016 1058   CL 99 (L) 08/26/2016 1058   CO2 31 08/26/2016 1058   BUN 15 08/26/2016 1058   CREATININE 3.88 (H) 08/26/2016 1058      Component Value Date/Time    CALCIUM 8.6 (L) 08/26/2016 1058   ALKPHOS 52 08/26/2016 1058   AST 18 08/26/2016 1058   ALT 13 (L) 08/26/2016 1058   BILITOT 0.1 (L) 08/26/2016 1058      Lab Results  Component Value Date   CEA 218.2 (H) 08/05/2016     PENDING LABS:   RADIOGRAPHIC STUDIES:  Ct Chest W Contrast  Result Date: 07/28/2016 CLINICAL DATA:  Restaging of stage IV adenocarcinoma of the cecum. Metastasis to intra abdominal lymph nodes. End-stage renal disease. On dialysis. Status post chemotherapy. EXAM: CT CHEST, ABDOMEN, AND PELVIS WITH CONTRAST TECHNIQUE: Multidetector CT imaging of the chest, abdomen and pelvis was performed following the standard protocol during bolus administration of intravenous contrast. CONTRAST:  184m ISOVUE-300 IOPAMIDOL (ISOVUE-300) INJECTION 61% COMPARISON:  03/24/2016 and chest radiograph of 07/25/2016. FINDINGS: CT CHEST FINDINGS Cardiovascular: Left-sided Port-A-Cath which terminates at the mid SVC. Aortic and branch vessel atherosclerosis. Normal heart size, without pericardial effusion. Multivessel coronary artery atherosclerosis. No central pulmonary embolism, on this non-dedicated study. Right subclavian venous stent is new. Mediastinum/Nodes: No supraclavicular adenopathy. Edema about the right chest wall and axilla is likely related to prior venous insufficiency or the presence of the stent. Mild bilateral gynecomastia, worse on the left. No mediastinal or hilar adenopathy. Posterior mediastinal node is similar and not pathologic by size criteria. Example at 9 mm on image 32/series 2. Dilated lower esophagus with contrast level within. Example image 35/series 2. Lungs/Pleura: Trace bilateral pleural fluid. Azygos fissure. Subsegmental atelectasis or scarring in both lung bases. Musculoskeletal: No acute osseous abnormality. CT ABDOMEN PELVIS FINDINGS Hepatobiliary: Mild motion degradation in the upper abdomen. A too small to characterize right hepatic lobe lesion image 76/series 2  is felt to be similar to on the prior. Small gallstones. No acute cholecystitis. Intrahepatic ducts are borderline prominent, similar. The common duct is normal. Pancreas: Pancreatic atrophy, without dominant mass or duct dilatation. Spleen: Normal in size, without focal abnormality. Adrenals/Urinary Tract: Normal adrenal glands. Moderate bilateral renal atrophy. Interpolar too small to characterize right renal lesion. Decreased renal function, especially on the right. Right perinephric interstitial thickening again identified. The most nodular component measures on the order of 1.6 x 2.9 cm on image 85/series 2. Compare 1.9 x 2.9 cm on the prior exam (when remeasured). Normal urinary bladder. Stomach/Bowel: Normal stomach, without wall thickening. Scattered colonic diverticula. Partial right hemicolectomy. Soft tissue thickening is identified in the region of  the ileocolic anastomosis. Example image 90/series 2. This is superimposed upon and may be accentuated by underdistention. No obstruction. Vascular/Lymphatic: Aortic and branch vessel atherosclerosis. Retroperitoneal adenopathy. An index left periaortic nodal mass measures 2.6 x 3.6 cm on image 80/series 2. Compare 2.6 x 3.3 cm on the prior. More inferior left periaortic node measures 2.2 cm on image 86/series 2. Compare 1.9 cm previously. Preaortic 1.0 cm node on image 78/series 2 is enlarged from 9 mm previously. Reproductive: Normal prostate. Other: No significant free fluid. Thickening of the right lateral Conal fascia persists, including on image 87/series 2. Left para midline tiny ventral fat containing pelvic hernia. Anterior abdominal wall laxity contains fat. Musculoskeletal: Advanced degenerative disc disease at L3-4. IMPRESSION: CT CHEST IMPRESSION 1. No acute process or evidence of metastatic disease in the chest. Similar size of a small posterior mediastinal node, not pathologic by size criteria. 2. Trace bilateral pleural fluid. 3. Esophageal air  fluid level suggests dysmotility or gastroesophageal reflux. 4.  Coronary artery atherosclerosis. Aortic atherosclerosis. CT ABDOMEN AND PELVIS IMPRESSION 1. Status post right hemicolectomy. Mild soft tissue fullness in the region of the anastomosis for which locally recurrent disease or inflammation versus ischemia are differential considerations. 2. Mild progression of retroperitoneal adenopathy. 3. Similar metastasis within the right para renal space and right posterior peritoneal surface. 4. Cholelithiasis. 5.  Aortic atherosclerosis. Electronically Signed   By: Abigail Miyamoto M.D.   On: 07/28/2016 15:03   Ct Abdomen Pelvis W Contrast  Result Date: 07/28/2016 CLINICAL DATA:  Restaging of stage IV adenocarcinoma of the cecum. Metastasis to intra abdominal lymph nodes. End-stage renal disease. On dialysis. Status post chemotherapy. EXAM: CT CHEST, ABDOMEN, AND PELVIS WITH CONTRAST TECHNIQUE: Multidetector CT imaging of the chest, abdomen and pelvis was performed following the standard protocol during bolus administration of intravenous contrast. CONTRAST:  127m ISOVUE-300 IOPAMIDOL (ISOVUE-300) INJECTION 61% COMPARISON:  03/24/2016 and chest radiograph of 07/25/2016. FINDINGS: CT CHEST FINDINGS Cardiovascular: Left-sided Port-A-Cath which terminates at the mid SVC. Aortic and branch vessel atherosclerosis. Normal heart size, without pericardial effusion. Multivessel coronary artery atherosclerosis. No central pulmonary embolism, on this non-dedicated study. Right subclavian venous stent is new. Mediastinum/Nodes: No supraclavicular adenopathy. Edema about the right chest wall and axilla is likely related to prior venous insufficiency or the presence of the stent. Mild bilateral gynecomastia, worse on the left. No mediastinal or hilar adenopathy. Posterior mediastinal node is similar and not pathologic by size criteria. Example at 9 mm on image 32/series 2. Dilated lower esophagus with contrast level within.  Example image 35/series 2. Lungs/Pleura: Trace bilateral pleural fluid. Azygos fissure. Subsegmental atelectasis or scarring in both lung bases. Musculoskeletal: No acute osseous abnormality. CT ABDOMEN PELVIS FINDINGS Hepatobiliary: Mild motion degradation in the upper abdomen. A too small to characterize right hepatic lobe lesion image 76/series 2 is felt to be similar to on the prior. Small gallstones. No acute cholecystitis. Intrahepatic ducts are borderline prominent, similar. The common duct is normal. Pancreas: Pancreatic atrophy, without dominant mass or duct dilatation. Spleen: Normal in size, without focal abnormality. Adrenals/Urinary Tract: Normal adrenal glands. Moderate bilateral renal atrophy. Interpolar too small to characterize right renal lesion. Decreased renal function, especially on the right. Right perinephric interstitial thickening again identified. The most nodular component measures on the order of 1.6 x 2.9 cm on image 85/series 2. Compare 1.9 x 2.9 cm on the prior exam (when remeasured). Normal urinary bladder. Stomach/Bowel: Normal stomach, without wall thickening. Scattered colonic diverticula. Partial right hemicolectomy. Soft tissue thickening  is identified in the region of the ileocolic anastomosis. Example image 90/series 2. This is superimposed upon and may be accentuated by underdistention. No obstruction. Vascular/Lymphatic: Aortic and branch vessel atherosclerosis. Retroperitoneal adenopathy. An index left periaortic nodal mass measures 2.6 x 3.6 cm on image 80/series 2. Compare 2.6 x 3.3 cm on the prior. More inferior left periaortic node measures 2.2 cm on image 86/series 2. Compare 1.9 cm previously. Preaortic 1.0 cm node on image 78/series 2 is enlarged from 9 mm previously. Reproductive: Normal prostate. Other: No significant free fluid. Thickening of the right lateral Conal fascia persists, including on image 87/series 2. Left para midline tiny ventral fat containing  pelvic hernia. Anterior abdominal wall laxity contains fat. Musculoskeletal: Advanced degenerative disc disease at L3-4. IMPRESSION: CT CHEST IMPRESSION 1. No acute process or evidence of metastatic disease in the chest. Similar size of a small posterior mediastinal node, not pathologic by size criteria. 2. Trace bilateral pleural fluid. 3. Esophageal air fluid level suggests dysmotility or gastroesophageal reflux. 4.  Coronary artery atherosclerosis. Aortic atherosclerosis. CT ABDOMEN AND PELVIS IMPRESSION 1. Status post right hemicolectomy. Mild soft tissue fullness in the region of the anastomosis for which locally recurrent disease or inflammation versus ischemia are differential considerations. 2. Mild progression of retroperitoneal adenopathy. 3. Similar metastasis within the right para renal space and right posterior peritoneal surface. 4. Cholelithiasis. 5.  Aortic atherosclerosis. Electronically Signed   By: Abigail Miyamoto M.D.   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  Now on FOLFIRI + Erbitux  Oncology history updated.  Labs today: CBC diff, CMET, Magnesium.  I personally reviewed and went over laboratory results with the patient.  The results are noted within this dictation. Laboratory work satisfies treatment criteria.  Anemia is noted and stable.  A copy of Darris's labs will be faxed to his nephrologist today.  Given his rising CEA, I will investigate next treatment options (ie Regorafinib, Lonsurf).  I have messaged our pharmacist regarding possible options given his ongoing hemodialysis.  CT CAP w contrast is ordered for ~ 3-4 weeks.  Return in 2 weeks for treatment and follow-up.   ORDERS PLACED FOR THIS  ENCOUNTER: Orders Placed This Encounter  Procedures  . CT Abdomen Pelvis W Contrast  . CT Chest W Contrast  . CEA    MEDICATIONS PRESCRIBED THIS ENCOUNTER: Meds ordered this encounter  Medications  . diphenoxylate-atropine (LOMOTIL) 2.5-0.025 MG tablet    Sig: Take 1 tablet by mouth 4 (four) times daily as needed for diarrhea or loose stools.    Dispense:  60 tablet    Refill:  0    Order Specific Question:   Supervising Provider    Answer:   Patrici Ranks [id number]  . loperamide (IMODIUM) 2 MG capsule    Sig: Take 2 capsules after the first loose stool and then 1 capsule every 2 hours until you go a total of 12 hours without having a loose stool. If it is bedtime and you are having loose stools take 2 capsules every 4 hours until morning. Call [facility].    Dispense:  90 capsule    Refill:  0    Order Specific Question:   Supervising Provider    Answer:  Patrici Ranks [[id number]]    THERAPY PLAN:  Continue palliative treatment as outlined above.  All questions were answered. The patient knows to call the clinic with any problems, questions or concerns. We can certainly see the patient much sooner if necessary.  Patient and plan discussed with Dr. Ancil Linsey and she is in agreement with the aforementioned.   This note is electronically signed by: Doy Mince 08/26/2016 5:39 PM

## 2016-08-26 NOTE — Patient Instructions (Signed)
[facility] Discharge Instructions for Patients Receiving Chemotherapy   Beginning January 23rd 2017 lab work for the [facility] will be done in the  Main lab at [facility] on 1st floor. If you have a lab appointment with the [facility] please come in thru the  Main Entrance and check in at the main information desk   Today you received the following chemotherapy agents erbitux, leucovorin, irinotecan and 64fu pump.  To help prevent nausea and vomiting after your treatment, we encourage you to take your nausea medication as instructed.  If you develop nausea and vomiting, or diarrhea that is not controlled by your medication, call the clinic.  The clinic phone number is [phone]. Office hours are Monday-Friday 8:30am-5:00pm.  BELOW ARE SYMPTOMS THAT SHOULD BE REPORTED IMMEDIATELY:  *FEVER GREATER THAN 101.0 F  *CHILLS WITH OR WITHOUT FEVER  NAUSEA AND VOMITING THAT IS NOT CONTROLLED WITH YOUR NAUSEA MEDICATION  *UNUSUAL SHORTNESS OF BREATH  *UNUSUAL BRUISING OR BLEEDING  TENDERNESS IN MOUTH AND THROAT WITH OR WITHOUT PRESENCE OF ULCERS  *URINARY PROBLEMS  *BOWEL PROBLEMS  UNUSUAL RASH Items with * indicate a potential emergency and should be followed up as soon as possible. If you have an emergency after office hours please contact your primary care physician or go to the nearest emergency department.  Please call the clinic during office hours if you have any questions or concerns.   You may also contact the Patient Navigator at [phone] should you have any questions or need assistance in obtaining follow up care.      Resources For Cancer Patients and their Caregivers ? American Cancer Society: Can assist with transportation, wigs, general needs, runs Look Good Feel Better.        (680)356-8424 ? Cancer Care: Provides financial assistance, online support groups, medication/co-pay assistance.  1-800-813-HOPE 3091067168) ? [place] Assists [place] cancer patients and their families through emotional , educational and financial support.  647-377-7302 ? [name] DSS Where to apply for food stamps, Medicaid and utility assistance. (531)884-9446 ? [name]: Transportation to medical appointments. 2292797437 ? Social Security Administration: May apply for disability if have a Stage IV cancer. (952)266-1365 952-603-5680 ? [employer], Disability and Transit Services: Assists with nutrition, care and transit needs. (479) 472-3008

## 2016-08-26 NOTE — Patient Instructions (Signed)
[place] at [facility] Discharge Instructions  RECOMMENDATIONS MADE BY THE CONSULTANT AND ANY TEST RESULTS WILL BE SENT TO YOUR REFERRING PHYSICIAN.  Refill Imodium - done  Scans in 3-4 weeks  Return in 2 weeks for treatment and follow up with doctor  Thank you for choosing [place] at [facility] to provide your oncology and hematology care.  To afford each patient quality time with our provider, please arrive at least 15 minutes before your scheduled appointment time.   Beginning January 23rd 2017 lab work for the [name] will be done in the  Main lab at [facility] on 1st floor. If you have a lab appointment with the [facility] please come in thru the  Main Entrance and check in at the main information desk  You need to re-schedule your appointment should you arrive 10 or more minutes late.  We strive to give you quality time with our providers, and arriving late affects you and other patients whose appointments are after yours.  Also, if you no show three or more times for appointments you may be dismissed from the clinic at the providers discretion.     Again, thank you for choosing [facility].  Our hope is that these requests will decrease the amount of time that you wait before being seen by our physicians.       _____________________________________________________________  Should you have questions after your visit to [facility], please contact our office at [phone] between the hours of 8:30 a.m. and 4:30 p.m.  Voicemails left after 4:30 p.m. will not be returned until the following business day.  For prescription refill requests, have your pharmacy contact our office.         Resources For Cancer Patients and their Caregivers ? American Cancer Society: Can assist with transportation, wigs, general needs, runs Look Good Feel Better.        ([phone] ? Cancer Care: Provides  financial assistance, online support groups, medication/co-pay assistance.  1-800-813-HOPE 865-034-4796) ? [place] Assists [place] cancer patients and their families through emotional , educational and financial support.  660-635-9590 ? [name] DSS Where to apply for food stamps, Medicaid and utility assistance. 385-310-9340 ? [name]: Transportation to medical appointments. 601-193-0690 ? Social Security Administration: May apply for disability if have a Stage IV cancer. 416-625-9357 [phone])341-8707 ? [employer], Disability and Transit Services: Assists with nutrition, care and transit needs. [place] Support Programs: @10RELATIVEDAYS @ > Cancer Support Group  2nd Tuesday of the month 1pm-2pm, [name]  > Creative Journey  3rd Tuesday of the month 1130am-1pm, [name]  > Look Good Feel Better  1st Wednesday of the month 10am-12 noon, [name] (Call [place] to register [phone])

## 2016-08-26 NOTE — Progress Notes (Signed)
Tolerated chemo well. Continuous infusion pump intact. Patient stable on discharge home with friend via wheelchair.

## 2016-08-26 NOTE — Assessment & Plan Note (Addendum)
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  Now on FOLFIRI + Erbitux  Oncology history updated.  Labs today: CBC diff, CMET, Magnesium.  I personally reviewed and went over laboratory results with the patient.  The results are noted within this dictation. Laboratory work satisfies treatment criteria.  Anemia is noted and stable.  A copy of Nathaniel Johnston's labs will be faxed to his nephrologist today.  Given his rising CEA, I will investigate next treatment options (ie Regorafinib, Lonsurf).  I have messaged our pharmacist regarding possible options given his ongoing hemodialysis.  CT CAP w contrast is ordered for ~ 3-4 weeks.  Return in 2 weeks for treatment and follow-up.

## 2016-08-27 ENCOUNTER — Encounter (HOSPITAL_COMMUNITY): Payer: Self-pay

## 2016-08-27 ENCOUNTER — Encounter (HOSPITAL_COMMUNITY): Payer: Medicare Other | Attending: Oncology

## 2016-08-27 ENCOUNTER — Other Ambulatory Visit (HOSPITAL_COMMUNITY): Payer: Self-pay | Admitting: Pharmacist

## 2016-08-27 VITALS — BP 146/73 | HR 72 | Temp 98.0°F | Resp 16

## 2016-08-27 DIAGNOSIS — C180 Malignant neoplasm of cecum: Secondary | ICD-10-CM | POA: Insufficient documentation

## 2016-08-27 LAB — CEA: CEA: 228.4 ng/mL — ABNORMAL HIGH (ref 0.0–4.7)

## 2016-08-27 MED ORDER — HEPARIN SOD (PORK) LOCK FLUSH 100 UNIT/ML IV SOLN
500.0000 [IU] | Freq: Once | INTRAVENOUS | Status: AC | PRN
  Administered 2016-08-27: 500 [IU]

## 2016-08-27 MED ORDER — HEPARIN SOD (PORK) LOCK FLUSH 100 UNIT/ML IV SOLN
INTRAVENOUS | Status: AC
  Filled 2016-08-27: qty 5

## 2016-08-27 MED ORDER — SODIUM CHLORIDE 0.9% FLUSH
10.0000 mL | INTRAVENOUS | Status: DC | PRN
  Administered 2016-08-27: 10 mL
  Filled 2016-08-27: qty 10

## 2016-08-27 NOTE — Patient Instructions (Signed)
[place] at [facility] Discharge Instructions  RECOMMENDATIONS MADE BY THE CONSULTANT AND ANY TEST RESULTS WILL BE SENT TO YOUR REFERRING PHYSICIAN.  Pump removal and port flush.  Thank you for choosing [place] at [facility] to provide your oncology and hematology care.  To afford each patient quality time with our provider, please arrive at least 15 minutes before your scheduled appointment time.   Beginning January 23rd 2017 lab work for the [name] will be done in the  Main lab at [facility] on 1st floor. If you have a lab appointment with the [facility] please come in thru the  Main Entrance and check in at the main information desk  You need to re-schedule your appointment should you arrive 10 or more minutes late.  We strive to give you quality time with our providers, and arriving late affects you and other patients whose appointments are after yours.  Also, if you no show three or more times for appointments you may be dismissed from the clinic at the providers discretion.     Again, thank you for choosing [facility].  Our hope is that these requests will decrease the amount of time that you wait before being seen by our physicians.       _____________________________________________________________  Should you have questions after your visit to [facility], please contact our office at [phone] between the hours of 8:30 a.m. and 4:30 p.m.  Voicemails left after 4:30 p.m. will not be returned until the following business day.  For prescription refill requests, have your pharmacy contact our office.         Resources For Cancer Patients and their Caregivers ? American Cancer Society: Can assist with transportation, wigs, general needs, runs Look Good Feel Better.        703-740-6387 ? Cancer Care: Provides financial assistance, online support groups, medication/co-pay assistance.   1-800-813-HOPE (346)878-3891) ? [place] Assists [place] cancer patients and their families through emotional , educational and financial support.  930-049-2803 ? [name] DSS Where to apply for food stamps, Medicaid and utility assistance. 928-817-4519 ? [name]: Transportation to medical appointments. (980) 808-5778 ? Social Security Administration: May apply for disability if have a Stage IV cancer. 534-803-3979 [phone]) 831-1720 ? [employer], Disability and Transit Services: Assists with nutrition, care and transit needs. [place] Support Programs: @10RELATIVEDAYS @ > Cancer Support Group  2nd Tuesday of the month 1pm-2pm, [name]  > Creative Journey  3rd Tuesday of the month 1130am-1pm, [name]  > Look Good Feel Better  1st Wednesday of the month 10am-12 noon, [name] (Call [place] to register [phone])

## 2016-08-27 NOTE — Progress Notes (Signed)
Patient arrives for pump removal and port flush.  Pump infused without any issue and infusion complete.  VSS.  Port flushed per protocol and needle removed intact and no active bleeding.  Patient tolerated well.  Patient stable and wheeled out via wheelchair by friend.

## 2016-08-30 ENCOUNTER — Encounter (HOSPITAL_COMMUNITY): Payer: Self-pay | Admitting: *Deleted

## 2016-08-30 ENCOUNTER — Emergency Department (HOSPITAL_COMMUNITY): Payer: Medicare Other

## 2016-08-30 ENCOUNTER — Emergency Department (HOSPITAL_COMMUNITY)
Admission: EM | Admit: 2016-08-30 | Discharge: 2016-08-30 | Disposition: A | Discharge status: Home / Self Care | Payer: Medicare Other | Attending: Emergency Medicine | Admitting: Emergency Medicine

## 2016-08-30 DIAGNOSIS — Z7984 Long term (current) use of oral hypoglycemic drugs: Secondary | ICD-10-CM | POA: Diagnosis not present

## 2016-08-30 DIAGNOSIS — R1900 Intra-abdominal and pelvic swelling, mass and lump, unspecified site: Secondary | ICD-10-CM | POA: Diagnosis not present

## 2016-08-30 DIAGNOSIS — Z87891 Personal history of nicotine dependence: Secondary | ICD-10-CM | POA: Insufficient documentation

## 2016-08-30 DIAGNOSIS — Z79899 Other long term (current) drug therapy: Secondary | ICD-10-CM | POA: Insufficient documentation

## 2016-08-30 DIAGNOSIS — R319 Hematuria, unspecified: Secondary | ICD-10-CM | POA: Insufficient documentation

## 2016-08-30 DIAGNOSIS — R079 Chest pain, unspecified: Secondary | ICD-10-CM | POA: Insufficient documentation

## 2016-08-30 DIAGNOSIS — Z85038 Personal history of other malignant neoplasm of large intestine: Secondary | ICD-10-CM | POA: Insufficient documentation

## 2016-08-30 DIAGNOSIS — Z992 Dependence on renal dialysis: Secondary | ICD-10-CM | POA: Diagnosis not present

## 2016-08-30 DIAGNOSIS — N186 End stage renal disease: Secondary | ICD-10-CM | POA: Insufficient documentation

## 2016-08-30 DIAGNOSIS — I120 Hypertensive chronic kidney disease with stage 5 chronic kidney disease or end stage renal disease: Secondary | ICD-10-CM | POA: Diagnosis not present

## 2016-08-30 DIAGNOSIS — E1122 Type 2 diabetes mellitus with diabetic chronic kidney disease: Secondary | ICD-10-CM | POA: Insufficient documentation

## 2016-08-30 DIAGNOSIS — R1084 Generalized abdominal pain: Secondary | ICD-10-CM | POA: Diagnosis present

## 2016-08-30 LAB — COMPREHENSIVE METABOLIC PANEL
ALBUMIN: 3.2 g/dL — AB (ref 3.5–5.0)
ALK PHOS: 52 U/L (ref 38–126)
ALT: 16 U/L — AB (ref 17–63)
AST: 19 U/L (ref 15–41)
Anion gap: 9 (ref 5–15)
BUN: 43 mg/dL — AB (ref 6–20)
CALCIUM: 8.6 mg/dL — AB (ref 8.9–10.3)
CO2: 29 mmol/L (ref 22–32)
CREATININE: 6.49 mg/dL — AB (ref 0.61–1.24)
Chloride: 99 mmol/L — ABNORMAL LOW (ref 101–111)
GFR calc non Af Amer: 8 mL/min — ABNORMAL LOW (ref >60)
GFR, EST AFRICAN AMERICAN: 10 mL/min — AB (ref >60)
GLUCOSE: 157 mg/dL — AB (ref 65–99)
Potassium: 4.8 mmol/L (ref 3.5–5.1)
SODIUM: 137 mmol/L (ref 135–145)
Total Bilirubin: 0.3 mg/dL (ref 0.3–1.2)
Total Protein: 6.6 g/dL (ref 6.5–8.1)

## 2016-08-30 LAB — URINALYSIS, ROUTINE W REFLEX MICROSCOPIC
BILIRUBIN URINE: NEGATIVE
GLUCOSE, UA: 100 mg/dL — AB
KETONES UR: NEGATIVE mg/dL
Leukocytes, UA: NEGATIVE
Nitrite: NEGATIVE
PH: 8.5 — AB (ref 5.0–8.0)
Protein, ur: 100 mg/dL — AB
Specific Gravity, Urine: 1.01 (ref 1.005–1.030)

## 2016-08-30 LAB — CBC WITH DIFFERENTIAL/PLATELET
Basophils Absolute: 0 10*3/uL (ref 0.0–0.1)
Basophils Relative: 0 %
EOS ABS: 0.3 10*3/uL (ref 0.0–0.7)
Eosinophils Relative: 4 %
HCT: 28.4 % — ABNORMAL LOW (ref 39.0–52.0)
HEMOGLOBIN: 8.8 g/dL — AB (ref 13.0–17.0)
LYMPHS ABS: 1 10*3/uL (ref 0.7–4.0)
Lymphocytes Relative: 15 %
MCH: 29.4 pg (ref 26.0–34.0)
MCHC: 31 g/dL (ref 30.0–36.0)
MCV: 95 fL (ref 78.0–100.0)
Monocytes Absolute: 0.2 10*3/uL (ref 0.1–1.0)
Monocytes Relative: 3 %
NEUTROS PCT: 78 %
Neutro Abs: 5.2 10*3/uL (ref 1.7–7.7)
Platelets: 358 10*3/uL (ref 150–400)
RBC: 2.99 MIL/uL — AB (ref 4.22–5.81)
RDW: 18 % — ABNORMAL HIGH (ref 11.5–15.5)
WBC: 6.7 10*3/uL (ref 4.0–10.5)

## 2016-08-30 LAB — URINE MICROSCOPIC-ADD ON
Squamous Epithelial / LPF: NONE SEEN
WBC, UA: NONE SEEN WBC/hpf (ref 0–5)

## 2016-08-30 LAB — LIPASE, BLOOD: Lipase: 17 U/L (ref 11–51)

## 2016-08-30 MED ORDER — HYDROCODONE-ACETAMINOPHEN 5-325 MG PO TABS: 1.0000 | ORAL_TABLET | Freq: Four times a day (QID) | ORAL | 0 refills | Status: DC | PRN

## 2016-08-30 MED ORDER — IOPAMIDOL (ISOVUE-300) INJECTION 61%
100.0000 mL | Freq: Once | INTRAVENOUS | Status: AC | PRN
  Administered 2016-08-30: 100 mL via INTRAVENOUS

## 2016-08-30 MED ORDER — HEPARIN SOD (PORK) LOCK FLUSH 100 UNIT/ML IV SOLN
INTRAVENOUS | Status: AC
  Filled 2016-08-30: qty 5

## 2016-08-30 MED ORDER — CEPHALEXIN 500 MG PO CAPS: 500.0000 mg | ORAL_CAPSULE | Freq: Four times a day (QID) | ORAL | 0 refills | Status: DC

## 2016-08-30 MED ORDER — IOPAMIDOL (ISOVUE-300) INJECTION 61%
INTRAVENOUS | Status: AC
  Filled 2016-08-30: qty 30

## 2016-08-30 NOTE — Discharge Instructions (Signed)
Follow up with your md in one week 

## 2016-08-30 NOTE — ED Notes (Signed)
Pt unable to urinate. nad

## 2016-08-30 NOTE — ED Notes (Signed)
Materials aware needing power port. Nad.

## 2016-08-30 NOTE — ED Notes (Signed)
Pt was not able to urinate. Stats just went pta. Aware awaiting ct scan.

## 2016-08-30 NOTE — ED Notes (Signed)
Pt finished contrast CT aware

## 2016-08-30 NOTE — ED Triage Notes (Addendum)
Pt states he had chemo last Thursday and Friday and dialysis on Saturday. Pt c/o generalized body aches (abdomen, back, shoulder, chest, bilateral arms) since Thursday.

## 2016-08-30 NOTE — ED Notes (Signed)
Dr Zammit at bedside. 

## 2016-08-30 NOTE — ED Provider Notes (Signed)
[facility] DEPT Provider Note   CSN: [id number] Arrival date & time: 08/30/16  [id number]     History   Chief Complaint Chief Complaint  Patient presents with  . chemo card-Generalized Body aches    HPI Nathaniel Johnston is a 60 y.o. male.  Pt complains of abd pain for over a weak and back pain    Abdominal Pain   This is a recurrent problem. The current episode started more than 2 days ago. The problem occurs constantly. The problem has not changed since onset.The pain is associated with an unknown factor. The pain is located in the generalized abdominal region. Pertinent negatives include diarrhea, frequency, hematuria and headaches.    Past Medical History:  Diagnosis Date  . 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 Cancer ([place]) 07/2014   colon cancer surgery.  Finished Chemo 02/2015  . Chronic kidney disease   . Diabetes mellitus without complication ([place])   . Dialysis patient ([place]) 2010  . DNR (do not resuscitate) 03/11/2016  . ESRD (end stage renal disease) ([place]) 03/11/2016  . GERD (gastroesophageal reflux disease)   . High cholesterol   . Hypertension   . Stroke [facility])    2003    Patient Active Problem List   Diagnosis Date Noted  . Colon cancer metastasized to intra-abdominal lymph node ([place]) 05/31/2016  . Acute blood loss anemia 05/05/2016  . GI bleed 05/04/2016  . Dialysis AV fistula malfunction ([place])   . Diarrhea 04/01/2016  . ESRD (end stage renal disease) ([place]) 03/11/2016  . 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 High cholesterol     Past Surgical History:  Procedure Laterality Date  . AMPUTATION TOE Left 2001  . CATARACT EXTRACTION W/PHACO Right 06/16/2015   Procedure: CATARACT EXTRACTION PHACO AND INTRAOCULAR LENS PLACEMENT RIGHT EYE CDE=54.69;  Surgeon: Williams Che, MD;  Location: [facility] ORS;  Service: Ophthalmology;  Laterality: Right;  . COLECTOMY  07/2014  . DIALYSIS FISTULA CREATION Right   . ESOPHAGOGASTRODUODENOSCOPY N/A 05/06/2016   Procedure: ESOPHAGOGASTRODUODENOSCOPY (EGD);  Surgeon: Rogene Houston, MD;  Location: [facility] ENDO SUITE;  Service: Endoscopy;  Laterality: N/A;  . toe amputation Right 2000  . UPPER EXTREMITY ANGIOGRAM Right 05/03/2016   Procedure: RIGHT UPPER EXTREMITY ANGIOGRAM OF ARTERIAL VENOUS FISTULA , BALLOON ANGIOPLASTY;  Surgeon: Vickie Epley, MD;  Location: [facility] ORS;  Service: Vascular;  Laterality: Right;  . YAG LASER APPLICATION Right [id number]   Procedure: YAG LASER APPLICATION;  Surgeon: Williams Che, MD;  Location: [facility] ORS;  Service: Ophthalmology;  Laterality: Right;       Home Medications    Prior to Admission medications   Medication Sig Start Date End Date Taking? Authorizing Provider  amLODipine (NORVASC) 10 MG tablet Take 2 tablets by mouth daily.  07/29/14  Yes Historical Provider, MD  calcium acetate (PHOSLO) 667 MG capsule Take 1 capsule by mouth 3 (three) times daily with meals.  07/15/14  Yes Historical Provider, MD  diphenoxylate-atropine (LOMOTIL) 2.5-0.025 MG tablet Take 1 tablet by mouth 4 (four) times daily as needed for diarrhea or loose stools. 08/26/16  Yes Manon Hilding Kefalas, PA-C  glipiZIDE (GLUCOTROL) 10 MG tablet Take 1 tablet by mouth 2 (two) times daily before a meal.  07/01/14  Yes Historical Provider, MD  labetalol (NORMODYNE) 300 MG tablet Take 300 mg by mouth 2 (two) times daily.  06/02/10  Yes Historical Provider, MD  lisinopril (PRINIVIL,ZESTRIL) 40 MG tablet Take 40 mg by mouth daily.  06/02/10  Yes  Historical Provider, MD  loperamide (IMODIUM) 2 MG capsule Take 2 capsules after the first loose stool and then 1 capsule every 2 hours until you go a total of 12 hours without having a loose stool. If it is bedtime and you are having loose stools take 2 capsules every 4 hours until morning. Call [facility]. 08/26/16  Yes Manon Hilding Kefalas, PA-C  ondansetron (ZOFRAN) 8 MG tablet Take 1 tablet (8 mg total) by mouth 2 (two) times daily as needed for refractory nausea /  vomiting. Start on day 3 after chemotherapy. 04/27/16  Yes Patrici Ranks, MD  potassium chloride SA (K-DUR,KLOR-CON) 20 MEQ tablet Take 1 tablet (20 mEq total) by mouth daily. 04/06/16  Yes Baird Cancer, PA-C  sevelamer carbonate (RENVELA) 800 MG tablet Take 800 mg by mouth 3 (three) times daily with meals.  07/30/14  Yes Historical Provider, MD  simvastatin (ZOCOR) 80 MG tablet Take 1 tablet by mouth daily at 6 PM.  08/17/14  Yes Historical Provider, MD  Skin Protectants, Misc. (EUCERIN) cream Apply 1 application topically as needed for dry skin.  08/29/14  Yes Historical Provider, MD  triamcinolone cream (KENALOG) 0.1 % Apply 1 application topically daily as needed (for skin irritation).  06/05/14  Yes Historical Provider, MD  cephALEXin (KEFLEX) 500 MG capsule Take 1 capsule (500 mg total) by mouth 4 (four) times daily. 08/30/16   Milton Ferguson, MD  Cetuximab ([place] IV) Inject into the vein every 14 (fourteen) days. Every 2 weeks     Historical Provider, MD  dexamethasone (DECADRON) 4 MG tablet Take 2 tablets (8 mg total) by mouth daily. Start the day after chemo for 2 days. 04/27/16   Patrici Ranks, MD  doxycycline (VIBRA-TABS) 100 MG tablet Take 1 tablet (100 mg total) by mouth 2 (two) times daily. Patient not taking: Reported on 08/30/2016 06/24/16   Baird Cancer, PA-C  fluorouracil CALGB 29562 in sodium chloride 0.9 % 150 mL Inject into the vein over 24 hr.    Historical Provider, MD  HYDROcodone-acetaminophen (NORCO/VICODIN) 5-325 MG tablet Take 1 tablet by mouth every 6 (six) hours as needed for moderate pain. 08/30/16   Milton Ferguson, MD  irinotecan in dextrose 5 % 500 mL Inject into the vein every 14 (fourteen) days. Every 2 weeks     Historical Provider, MD  LEUCOVORIN CALCIUM IJ Inject as directed every 14 (fourteen) days.     Historical Provider, MD  ofloxacin (OCUFLOX) 0.3 % ophthalmic solution Place 1 drop into both eyes 3 (three) times daily. 07/19/16   Historical Provider, MD     Family History History reviewed. No pertinent family history.  Social History Social History  Substance Use Topics  . Smoking status: Former Smoker    Packs/day: 0.50    Years: 15.00    Types: Cigarettes    Quit date: 03/12/1991  . Smokeless tobacco: Never Used  . Alcohol use No     Allergies   Patient has no known allergies.   Review of Systems Review of Systems  Constitutional: Negative for appetite change and fatigue.  HENT: Negative for congestion, ear discharge and sinus pressure.   Eyes: Negative for discharge.  Respiratory: Negative for cough.   Cardiovascular: Negative for chest pain.  Gastrointestinal: Positive for abdominal pain. Negative for diarrhea.  Genitourinary: Negative for frequency and hematuria.  Musculoskeletal: Negative for back pain.  Skin: Negative for rash.  Neurological: Negative for seizures and headaches.  Psychiatric/Behavioral: Negative for hallucinations.  Physical Exam Updated Vital Signs BP 131/71   Pulse 73   Temp 97.9 F (36.6 C) (Oral)   Resp 18   Ht 6\' 1"  (1.854 m)   Wt 237 lb (107.5 kg)   SpO2 100%   BMI 31.27 kg/m   Physical Exam  Constitutional: He is oriented to person, place, and time. He appears well-developed.  HENT:  Head: Normocephalic.  Eyes: Conjunctivae and EOM are normal. No scleral icterus.  Neck: Neck supple. No thyromegaly present.  Cardiovascular: Normal rate and regular rhythm.  Exam reveals no gallop and no friction rub.   No murmur heard. Pulmonary/Chest: No stridor. He has no wheezes. He has no rales. He exhibits no tenderness.  Abdominal: He exhibits no distension. There is tenderness. There is no rebound.  Musculoskeletal: Normal range of motion. He exhibits no edema.  Lymphadenopathy:    He has no cervical adenopathy.  Neurological: He is oriented to person, place, and time. He exhibits normal muscle tone. Coordination normal.  Skin: No rash noted. No erythema.  Psychiatric: He has a  normal mood and affect. His behavior is normal.     ED Treatments / Results  Labs (all labs ordered are listed, but only abnormal results are displayed) Labs Reviewed  CBC WITH DIFFERENTIAL/PLATELET - Abnormal; Notable for the following:       Result Value   RBC 2.99 (*)    Hemoglobin 8.8 (*)    HCT 28.4 (*)    RDW 18.0 (*)    All other components within normal limits  COMPREHENSIVE METABOLIC PANEL - Abnormal; Notable for the following:    Chloride 99 (*)    Glucose, Bld 157 (*)    BUN 43 (*)    Creatinine, Ser 6.49 (*)    Calcium 8.6 (*)    Albumin 3.2 (*)    ALT 16 (*)    GFR calc non Af Amer 8 (*)    GFR calc Af Amer 10 (*)    All other components within normal limits  URINALYSIS, ROUTINE W REFLEX MICROSCOPIC (NOT AT [facility]) - Abnormal; Notable for the following:    pH 8.5 (*)    Glucose, UA 100 (*)    Hgb urine dipstick LARGE (*)    Protein, ur 100 (*)    All other components within normal limits  URINE MICROSCOPIC-ADD ON - Abnormal; Notable for the following:    Bacteria, UA RARE (*)    All other components within normal limits  URINE CULTURE  LIPASE, BLOOD    EKG  EKG Interpretation None       Radiology Ct Chest W Contrast  Result Date: 08/30/2016 CLINICAL DATA:  60 year old male with a history of 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 Most recent chemotherapy 3 days prior. End-stage renal disease on hemodialysis. EXAM: CT CHEST, ABDOMEN, AND PELVIS WITH CONTRAST TECHNIQUE: Multidetector CT imaging of the chest, abdomen and pelvis was performed following the standard protocol during bolus administration of intravenous contrast. CONTRAST:  165mL ISOVUE-300 IOPAMIDOL (ISOVUE-300) INJECTION 61% COMPARISON:  07/28/2016 CT chest, abdomen and pelvis. FINDINGS: CT CHEST FINDINGS Cardiovascular: Normal heart size. Stable trace  pericardial effusion/thickening. Left internal jugular MediPort terminates in the middle third of the superior vena cava. Left main, left anterior descending, left circumflex and right coronary atherosclerosis. Atherosclerotic nonaneurysmal thoracic aorta. Stable top-normal caliber pulmonary arteries. No central pulmonary emboli. A stable stent graft  in the right subclavian vein. Mediastinum/Nodes: No discrete thyroid nodules. Unremarkable esophagus. No pathologically enlarged axillary, mediastinal or hilar lymph nodes. Stable top-normal 0.9 cm posterior paraesophageal mediastinal node (series 2/image 30). Lungs/Pleura: No pneumothorax. Stable trace bilateral pleural effusions. Nodular 7 mm basilar right lower lobe opacity (series 3/ image 132) is stable since 07/28/2016, not seen on 03/24/2016. No acute consolidative airspace disease, lung masses or new significant pulmonary nodules. Musculoskeletal: No aggressive appearing focal osseous lesions. Moderate thoracic spondylosis. Mild symmetric gynecomastia. CT ABDOMEN PELVIS FINDINGS Hepatobiliary: Normal liver size. There are 2 subcentimeter hypodense liver lesions, too small to characterize, stable since 07/28/2016, not definitely seen on 03/24/2016. No new liver lesions. Cholelithiasis. No definite gallbladder wall thickening or pericholecystic fluid. Mild diffuse intrahepatic biliary ductal dilatation, stable. Mildly dilated common bile duct (7 mm diameter), stable. No radiopaque choledocholithiasis. Pancreas: Normal, with no mass or duct dilation. Spleen: Normal size. No mass. Adrenals/Urinary Tract: No discrete adrenal nodules. Symmetric renal atrophy. A 4.1 x 3.6 cm mass centered in the region of the right renal pelvis (series 8/image 20) is increased from 3.6 x 3.4 cm on 07/28/2016. Stable mild right hydronephrosis. No left hydronephrosis. Subcentimeter hypodense renal cortical lesions in both kidneys are too small to characterize and appear stable. Irregular  soft tissue nodularity in the posterior/inferior right perinephric retroperitoneal space measuring up to 4.1 x 2.9 cm in maximum axial dimensions (series 8/ image 37), unchanged. Normal bladder. Stomach/Bowel: Grossly normal stomach. Status post right hemicolectomy with ileocolic anastomosis in the right abdomen. Stable masslike wall thickening at the ileocolic anastomosis (series 8/ image 46). No dilated small bowel loops. Oral contrast progresses to the distal small bowel. Moderate descending and sigmoid colonic diverticulosis. No additional sites of large bowel wall thickening or pericolonic fat stranding. Vascular/Lymphatic: Atherosclerotic nonaneurysmal abdominal aorta. Patent portal, splenic, hepatic and renal veins. Stable right retrocaval adenopathy measuring up to 2.5 cm (series 2/ image 71). Stable confluent left para-aortic adenopathy measuring up to the 2.6 cm (series 2/ image 79). Stable aortocaval adenopathy measuring up to 1.8 cm (series 2/ image 84). Reproductive: Normal size prostate. Other: No pneumoperitoneum, ascites or focal fluid collection. Stable small fat containing right inguinal hernia. Musculoskeletal: No aggressive appearing focal osseous lesions. Stable severe degenerative disc disease at L3-4 with associated eburnation. IMPRESSION: 1. No interval acute abnormality in the chest, abdomen or pelvis. 2. Stable mild right hydronephrosis due to obstructing mass centered at the right renal pelvis, which has mildly increased in size, most consistent with a retroperitoneal metastasis. 3. Additional sites of metastatic disease in the abdomen are stable, including confluent extensive retroperitoneal nodal metastases and infiltrative right perinephric retroperitoneal metastases. 4. Stable masslike wall thickening at the ileocolic anastomosis, most consistent with local tumor recurrence. 5. Two subcentimeter hypodense liver lesions, for which 1 month stability has been demonstrated, liver  metastases not excluded. 6. Previously demonstrated FDG avid posterior paraesophageal mediastinal lymph node is stable and top-normal size. Nodular 7 mm opacity in the basilar right lower lobe, for which 1 month stability has been demonstrated, lung metastasis not excluded. Otherwise no findings of metastatic disease in the chest. 7. Cholelithiasis. Stable mild biliary ductal dilatation. No radiopaque choledocholithiasis. Recommend correlation with serum bilirubin levels. 8. Additional findings include aortic atherosclerosis, coronary atherosclerosis, trace bilateral pleural effusions, moderate distal colonic diverticulosis and small fat containing right inguinal hernia. Electronically Signed   By: Ilona Sorrel M.D.   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 Most recent chemotherapy 3 days prior. End-stage renal disease on hemodialysis. EXAM: CT CHEST, ABDOMEN, AND PELVIS WITH CONTRAST TECHNIQUE: Multidetector CT imaging of the chest, abdomen and pelvis was performed following the standard protocol during bolus administration of intravenous contrast. CONTRAST:  146mL ISOVUE-300 IOPAMIDOL (ISOVUE-300) INJECTION 61% COMPARISON:  07/28/2016 CT chest, abdomen and pelvis. FINDINGS: CT CHEST FINDINGS Cardiovascular: Normal heart size. Stable trace pericardial effusion/thickening. Left internal jugular MediPort terminates in the middle third of the superior vena cava. Left main, left anterior descending, left circumflex and right coronary atherosclerosis. Atherosclerotic nonaneurysmal thoracic aorta. Stable top-normal caliber pulmonary arteries. No central pulmonary emboli. A stable stent graft in the right subclavian vein. Mediastinum/Nodes: No  discrete thyroid nodules. Unremarkable esophagus. No pathologically enlarged axillary, mediastinal or hilar lymph nodes. Stable top-normal 0.9 cm posterior paraesophageal mediastinal node (series 2/image 30). Lungs/Pleura: No pneumothorax. Stable trace bilateral pleural effusions. Nodular 7 mm basilar right lower lobe opacity (series 3/ image 132) is stable since 07/28/2016, not seen on 03/24/2016. No acute consolidative airspace disease, lung masses or new significant pulmonary nodules. Musculoskeletal: No aggressive appearing focal osseous lesions. Moderate thoracic spondylosis. Mild symmetric gynecomastia. CT ABDOMEN PELVIS FINDINGS Hepatobiliary: Normal liver size. There are 2 subcentimeter hypodense liver lesions, too small to characterize, stable since 07/28/2016, not definitely seen on 03/24/2016. No new liver lesions. Cholelithiasis. No definite gallbladder wall thickening or pericholecystic fluid. Mild diffuse intrahepatic biliary ductal dilatation, stable. Mildly dilated common bile duct (7 mm diameter), stable. No radiopaque choledocholithiasis. Pancreas: Normal, with no mass or duct dilation. Spleen: Normal size. No mass. Adrenals/Urinary Tract: No discrete adrenal nodules. Symmetric renal atrophy. A 4.1 x 3.6 cm mass centered in the region of the right renal pelvis (series 8/image 20) is increased from 3.6 x 3.4 cm on 07/28/2016. Stable mild right hydronephrosis. No left hydronephrosis. Subcentimeter hypodense renal cortical lesions in both kidneys are too small to characterize and appear stable. Irregular soft tissue nodularity in the posterior/inferior right perinephric retroperitoneal space measuring up to 4.1 x 2.9 cm in maximum axial dimensions (series 8/ image 37), unchanged. Normal bladder. Stomach/Bowel: Grossly normal stomach. Status post right hemicolectomy with ileocolic anastomosis in the right abdomen. Stable masslike wall thickening at the ileocolic anastomosis (series 8/ image 46). No  dilated small bowel loops. Oral contrast progresses to the distal small bowel. Moderate descending and sigmoid colonic diverticulosis. No additional sites of large bowel wall thickening or pericolonic fat stranding. Vascular/Lymphatic: Atherosclerotic nonaneurysmal abdominal aorta. Patent portal, splenic, hepatic and renal veins. Stable right retrocaval adenopathy measuring up to 2.5 cm (series 2/ image 71). Stable confluent left para-aortic adenopathy measuring up to the 2.6 cm (series 2/ image 79). Stable aortocaval adenopathy measuring up to 1.8 cm (series 2/ image 84). Reproductive: Normal size prostate. Other: No pneumoperitoneum, ascites or focal fluid collection. Stable small fat containing right inguinal hernia. Musculoskeletal: No aggressive appearing focal osseous lesions. Stable severe degenerative disc disease at L3-4 with associated eburnation. IMPRESSION: 1. No interval acute abnormality in the chest, abdomen or pelvis. 2. Stable mild right hydronephrosis due to obstructing mass centered at the right renal pelvis, which has mildly increased in size, most consistent with a retroperitoneal metastasis. 3. Additional sites of metastatic disease in the abdomen are stable, including confluent extensive retroperitoneal nodal metastases and infiltrative right perinephric retroperitoneal metastases. 4. Stable masslike  wall thickening at the ileocolic anastomosis, most consistent with local tumor recurrence. 5. Two subcentimeter hypodense liver lesions, for which 1 month stability has been demonstrated, liver metastases not excluded. 6. Previously demonstrated FDG avid posterior paraesophageal mediastinal lymph node is stable and top-normal size. Nodular 7 mm opacity in the basilar right lower lobe, for which 1 month stability has been demonstrated, lung metastasis not excluded. Otherwise no findings of metastatic disease in the chest. 7. Cholelithiasis. Stable mild biliary ductal dilatation. No radiopaque  choledocholithiasis. Recommend correlation with serum bilirubin levels. 8. Additional findings include aortic atherosclerosis, coronary atherosclerosis, trace bilateral pleural effusions, moderate distal colonic diverticulosis and small fat containing right inguinal hernia. Electronically Signed   By: Ilona Sorrel M.D.   On: 08/30/2016 10:03    Procedures Procedures (including critical care time)  Medications Ordered in ED Medications  iopamidol (ISOVUE-300) 61 % injection (not administered)  iopamidol (ISOVUE-300) 61 % injection 100 mL (100 mLs Intravenous Contrast Given 08/30/16 0850)     Initial Impression / Assessment and Plan / ED Course  I have reviewed the triage vital signs and the nursing notes.  Pertinent labs & imaging results that were available during my care of the patient were reviewed by me and considered in my medical decision making (see chart for details).  Clinical Course     Patient has hematuria.  This is due to his mass in his abdomen.  This mass is slightly enlarged.  His cancer hasn't significantly changed on CT.  Patient will be given Keflex to cover possible infection and Vicodin for pain and will follow-up with his cancer doctor this week  Final Clinical Impressions(s) / ED Diagnoses   Final diagnoses:  Hematuria of undiagnosed cause    New Prescriptions New Prescriptions   CEPHALEXIN (KEFLEX) 500 MG CAPSULE    Take 1 capsule (500 mg total) by mouth 4 (four) times daily.   HYDROCODONE-ACETAMINOPHEN (NORCO/VICODIN) 5-325 MG TABLET    Take 1 tablet by mouth every 6 (six) hours as needed for moderate pain.     Milton Ferguson, MD 08/30/16 1229

## 2016-08-31 ENCOUNTER — Other Ambulatory Visit (HOSPITAL_COMMUNITY): Payer: Self-pay | Admitting: Pharmacist

## 2016-09-01 LAB — URINE CULTURE: CULTURE: NO GROWTH

## 2016-09-02 ENCOUNTER — Other Ambulatory Visit (HOSPITAL_COMMUNITY): Payer: Self-pay | Admitting: Pharmacist

## 2016-09-03 ENCOUNTER — Other Ambulatory Visit (HOSPITAL_COMMUNITY): Payer: Self-pay | Admitting: Pharmacist

## 2016-09-07 ENCOUNTER — Other Ambulatory Visit (HOSPITAL_COMMUNITY): Payer: Self-pay | Admitting: Pharmacist

## 2016-09-09 ENCOUNTER — Encounter (HOSPITAL_BASED_OUTPATIENT_CLINIC_OR_DEPARTMENT_OTHER): Payer: Medicare Other

## 2016-09-09 ENCOUNTER — Encounter (HOSPITAL_COMMUNITY): Payer: Self-pay | Admitting: Hematology & Oncology

## 2016-09-09 ENCOUNTER — Encounter (HOSPITAL_BASED_OUTPATIENT_CLINIC_OR_DEPARTMENT_OTHER): Payer: Medicare Other | Admitting: Hematology & Oncology

## 2016-09-09 VITALS — BP 162/72 | HR 84 | Temp 98.2°F | Resp 18 | Wt 237.0 lb

## 2016-09-09 DIAGNOSIS — C189 Malignant neoplasm of colon, unspecified: Secondary | ICD-10-CM

## 2016-09-09 DIAGNOSIS — C180 Malignant neoplasm of cecum: Secondary | ICD-10-CM | POA: Diagnosis not present

## 2016-09-09 DIAGNOSIS — Z992 Dependence on renal dialysis: Secondary | ICD-10-CM

## 2016-09-09 DIAGNOSIS — C772 Secondary and unspecified malignant neoplasm of intra-abdominal lymph nodes: Secondary | ICD-10-CM

## 2016-09-09 DIAGNOSIS — Z7189 Other specified counseling: Secondary | ICD-10-CM

## 2016-09-09 DIAGNOSIS — R197 Diarrhea, unspecified: Secondary | ICD-10-CM | POA: Diagnosis not present

## 2016-09-09 DIAGNOSIS — N186 End stage renal disease: Secondary | ICD-10-CM | POA: Diagnosis not present

## 2016-09-09 DIAGNOSIS — Z5111 Encounter for antineoplastic chemotherapy: Secondary | ICD-10-CM

## 2016-09-09 DIAGNOSIS — Z5112 Encounter for antineoplastic immunotherapy: Secondary | ICD-10-CM

## 2016-09-09 LAB — CBC WITH DIFFERENTIAL/PLATELET
BASOS PCT: 0 %
Basophils Absolute: 0 10*3/uL (ref 0.0–0.1)
EOS ABS: 0.3 10*3/uL (ref 0.0–0.7)
EOS PCT: 6 %
HCT: 29.8 % — ABNORMAL LOW (ref 39.0–52.0)
HEMOGLOBIN: 9.4 g/dL — AB (ref 13.0–17.0)
Lymphocytes Relative: 22 %
Lymphs Abs: 1.2 10*3/uL (ref 0.7–4.0)
MCH: 29.9 pg (ref 26.0–34.0)
MCHC: 31.5 g/dL (ref 30.0–36.0)
MCV: 94.9 fL (ref 78.0–100.0)
Monocytes Absolute: 0.7 10*3/uL (ref 0.1–1.0)
Monocytes Relative: 12 %
NEUTROS PCT: 60 %
Neutro Abs: 3.2 10*3/uL (ref 1.7–7.7)
PLATELETS: 341 10*3/uL (ref 150–400)
RBC: 3.14 MIL/uL — AB (ref 4.22–5.81)
RDW: 18.8 % — ABNORMAL HIGH (ref 11.5–15.5)
WBC: 5.4 10*3/uL (ref 4.0–10.5)

## 2016-09-09 LAB — COMPREHENSIVE METABOLIC PANEL
ALBUMIN: 3.3 g/dL — AB (ref 3.5–5.0)
ALK PHOS: 58 U/L (ref 38–126)
ALT: 15 U/L — ABNORMAL LOW (ref 17–63)
ANION GAP: 10 (ref 5–15)
AST: 19 U/L (ref 15–41)
BUN: 25 mg/dL — ABNORMAL HIGH (ref 6–20)
CALCIUM: 8.4 mg/dL — AB (ref 8.9–10.3)
CHLORIDE: 97 mmol/L — AB (ref 101–111)
CO2: 29 mmol/L (ref 22–32)
Creatinine, Ser: 4.12 mg/dL — ABNORMAL HIGH (ref 0.61–1.24)
GFR calc Af Amer: 16 mL/min — ABNORMAL LOW (ref >60)
GFR calc non Af Amer: 14 mL/min — ABNORMAL LOW (ref >60)
GLUCOSE: 88 mg/dL (ref 65–99)
Potassium: 4.1 mmol/L (ref 3.5–5.1)
SODIUM: 136 mmol/L (ref 135–145)
Total Bilirubin: 0.3 mg/dL (ref 0.3–1.2)
Total Protein: 6.9 g/dL (ref 6.5–8.1)

## 2016-09-09 LAB — MAGNESIUM: Magnesium: 2.2 mg/dL (ref 1.7–2.4)

## 2016-09-09 MED ORDER — HEPARIN SOD (PORK) LOCK FLUSH 100 UNIT/ML IV SOLN: 500.0000 [IU] | Freq: Once | INTRAVENOUS | Status: DC | PRN

## 2016-09-09 MED ORDER — DIPHENHYDRAMINE HCL 50 MG/ML IJ SOLN
INTRAMUSCULAR | Status: AC
  Filled 2016-09-09: qty 1

## 2016-09-09 MED ORDER — DIPHENHYDRAMINE HCL 50 MG/ML IJ SOLN
50.0000 mg | Freq: Once | INTRAMUSCULAR | Status: AC
  Administered 2016-09-09: 50 mg via INTRAVENOUS

## 2016-09-09 MED ORDER — IRINOTECAN HCL CHEMO INJECTION 100 MG/5ML
90.0000 mg/m2 | Freq: Once | INTRAVENOUS | Status: AC
  Administered 2016-09-09: 220 mg via INTRAVENOUS
  Filled 2016-09-09: qty 4

## 2016-09-09 MED ORDER — LEUCOVORIN CALCIUM INJECTION 350 MG
400.0000 mg/m2 | Freq: Once | INTRAVENOUS | Status: AC
  Administered 2016-09-09: 972 mg via INTRAVENOUS
  Filled 2016-09-09: qty 48.6

## 2016-09-09 MED ORDER — FLUOROURACIL CHEMO INJECTION 2.5 GM/50ML
300.0000 mg/m2 | Freq: Once | INTRAVENOUS | Status: AC
  Administered 2016-09-09: 750 mg via INTRAVENOUS
  Filled 2016-09-09: qty 15

## 2016-09-09 MED ORDER — ATROPINE SULFATE 1 MG/ML IJ SOLN
INTRAMUSCULAR | Status: AC
  Filled 2016-09-09: qty 1

## 2016-09-09 MED ORDER — PALONOSETRON HCL INJECTION 0.25 MG/5ML
0.2500 mg | Freq: Once | INTRAVENOUS | Status: AC
  Administered 2016-09-09: 0.25 mg via INTRAVENOUS

## 2016-09-09 MED ORDER — CETUXIMAB CHEMO IV INJECTION 200 MG/100ML
500.0000 mg/m2 | Freq: Once | INTRAVENOUS | Status: AC
  Administered 2016-09-09: 1200 mg via INTRAVENOUS
  Filled 2016-09-09: qty 100

## 2016-09-09 MED ORDER — LOPERAMIDE HCL 2 MG PO CAPS
4.0000 mg | ORAL_CAPSULE | Freq: Once | ORAL | Status: AC
  Administered 2016-09-09: 4 mg via ORAL
  Filled 2016-09-09: qty 2

## 2016-09-09 MED ORDER — SODIUM CHLORIDE 0.9 % IV SOLN
1500.0000 mg/m2 | INTRAVENOUS | Status: DC
  Administered 2016-09-09: 3650 mg via INTRAVENOUS
  Filled 2016-09-09: qty 73

## 2016-09-09 MED ORDER — SODIUM CHLORIDE 0.9% FLUSH
10.0000 mL | INTRAVENOUS | Status: DC | PRN
  Administered 2016-09-09: 10 mL
  Filled 2016-09-09: qty 10

## 2016-09-09 MED ORDER — PALONOSETRON HCL INJECTION 0.25 MG/5ML
INTRAVENOUS | Status: AC
  Filled 2016-09-09: qty 5

## 2016-09-09 MED ORDER — HEPARIN SOD (PORK) LOCK FLUSH 100 UNIT/ML IV SOLN
INTRAVENOUS | Status: AC
  Filled 2016-09-09: qty 5

## 2016-09-09 MED ORDER — SODIUM CHLORIDE 0.9 % IV SOLN: 10.0000 mg | Freq: Once | INTRAVENOUS | Status: DC

## 2016-09-09 MED ORDER — SODIUM CHLORIDE 0.9 % IV SOLN
Freq: Once | INTRAVENOUS | Status: AC
  Administered 2016-09-09: 13:00:00 via INTRAVENOUS

## 2016-09-09 MED ORDER — ATROPINE SULFATE 1 MG/ML IJ SOLN
0.5000 mg | Freq: Once | INTRAMUSCULAR | Status: AC | PRN
  Administered 2016-09-09: 0.5 mg via INTRAVENOUS

## 2016-09-09 MED ORDER — DEXAMETHASONE SODIUM PHOSPHATE 10 MG/ML IJ SOLN
INTRAMUSCULAR | Status: AC
  Filled 2016-09-09: qty 1

## 2016-09-09 MED ORDER — DEXAMETHASONE SODIUM PHOSPHATE 10 MG/ML IJ SOLN
10.0000 mg | Freq: Once | INTRAMUSCULAR | Status: AC
  Administered 2016-09-09: 10 mg via INTRAVENOUS

## 2016-09-09 NOTE — Patient Instructions (Signed)
[facility] Discharge Instructions for Patients Receiving Chemotherapy   Beginning January 23rd 2017 lab work for the [facility] will be done in the  Main lab at [facility] on 1st floor. If you have a lab appointment with the [employer] please come in thru the  Main Entrance and check in at the main information desk   Today you received the following chemotherapy agents Erbitux, Irinotecan,Leucovorin and 5FU. Follow-up as scheduled. Call clinic for any questions or concerns  To help prevent nausea and vomiting after your treatment, we encourage you to take your nausea medication   If you develop nausea and vomiting, or diarrhea that is not controlled by your medication, call the clinic.  The clinic phone number is [phone]. Office hours are Monday-Friday 8:30am-5:00pm.  BELOW ARE SYMPTOMS THAT SHOULD BE REPORTED IMMEDIATELY:  *FEVER GREATER THAN 101.0 F  *CHILLS WITH OR WITHOUT FEVER  NAUSEA AND VOMITING THAT IS NOT CONTROLLED WITH YOUR NAUSEA MEDICATION  *UNUSUAL SHORTNESS OF BREATH  *UNUSUAL BRUISING OR BLEEDING  TENDERNESS IN MOUTH AND THROAT WITH OR WITHOUT PRESENCE OF ULCERS  *URINARY PROBLEMS  *BOWEL PROBLEMS  UNUSUAL RASH Items with * indicate a potential emergency and should be followed up as soon as possible. If you have an emergency after office hours please contact your primary care physician or go to the nearest emergency department.  Please call the clinic during office hours if you have any questions or concerns.   You may also contact the Patient Navigator at [phone] should you have any questions or need assistance in obtaining follow up care.      Resources For Cancer Patients and their Caregivers ? American Cancer Society: Can assist with transportation, wigs, general needs, runs Look Good Feel Better.        714-676-4863 ? Cancer Care: Provides financial assistance, online support groups, medication/co-pay  assistance.  1-800-813-HOPE (773)462-6778) ? [place] Assists [place] cancer patients and their families through emotional , educational and financial support.  6056706674 ? [name] DSS Where to apply for food stamps, Medicaid and utility assistance. 270 668 0639 ? [name]: Transportation to medical appointments. 602-319-3860 ? Social Security Administration: May apply for disability if have a Stage IV cancer. [phone] 986-194-5463 ? [employer], Disability and Transit Services: Assists with nutrition, care and transit needs. [phone]

## 2016-09-09 NOTE — Progress Notes (Signed)
Nathaniel Johnston tolerated chemo tx well without complaints or incident. Labs reviewed with MD prior to administering chemotherapy. Pt discharged with 5FU pump infusing without issues. VSS upon discharge. Pt discharged via wheelchair in satisfactory condition accompanied by his friend

## 2016-09-09 NOTE — Progress Notes (Signed)
[facility] Hematology/Oncology Progress Note  Name: Nathaniel Johnston      MRN: [id number]     Date: 09/25/2016 Time:10:29 AM   REFERRING PHYSICIAN:  Everardo All, MD (Medical Oncology [facility])   DIAGNOSIS:  Stage IV adenocarcinoma of colon    Adenocarcinoma of cecum ([place])   06/19/2014 Pathology Results    Biopsy of right colon mass positive for adenocarcinoma. He had multiple other tubular adenomas and an occasional tubulovillous adenomas seen. Cancer is K-ras mutation negative (wild type).      07/19/2014 Procedure    Colonoscopy      07/19/2014 Pathology Results    Right colon mass- invasive adenocarcinoma      07/25/2014 Surgery    Right hemicolectomy performed by Dr. Ladona Horns      07/25/2014 Pathology Results    mucinous adenocarcinoma, 6 cm in maximum dimension, arising from the cecum. positive for 19/25 nodes, +LVI, focal neural invasion, poorly differentiated with numerous extramural nodules, greater than 10      08/10/2014 Pathology Results    KRAS/NRAS mutation in negative.  No KRAS or NRAS mutations were detected in exon 2, 3, and 4. MSI STABLE      08/29/2014 - 02/20/2015 Chemotherapy    FOLFOX adjusted for renal failure and on dialysis, 12 cycles      04/02/2015 Imaging    CT abdomen pelvis-interval resolution of previously described anterior abdominal wall abscess. Ventral abdominal wall wound is predominantly healed. Small fat containing ventral abdominal wall hernia. NED.      08/18/2015 Progression         08/18/2015 PET scan    Prominent recurrence with extensive retroperitoneal hypermetabolic adenopathy, extensive adenopathy and nodularity in the right perirenal space and tracking in the right peritoneum. Hypermetabolic mass along the bowel anastomotic site.       09/04/2015 - 04/15/2016 Chemotherapy    FOLFOX chemotherapy reinitiated for recurrent disease.      11/28/2015 Imaging    CT abdomen and  pelvis-persistent nodularity along the right pararenal space, reduction in retroperitoneal lymphadenopathy, persistent enlarged retroperitoneal lymph node adjacent to the right kidney, no evidence of disease progression or liver metastases.      03/11/2016 Miscellaneous    Transfer of medical oncology care to [facility] (from [facility])      03/11/2016 Code Status    DNR      03/24/2016 Imaging    CT CAP- Mild increase in abdominal retroperitoneal lymphadenopathy since previous study. New mild right hydronephrosis also noted. No significant change in retroperitoneal soft tissue nodularity in right pararenal space, consistent with metastatic disease      04/29/2016 Treatment Plan Change    Change in therapy      04/29/2016 -  Chemotherapy    FOLFIRI + Erbitux      05/04/2016 - 05/07/2016 Hospital Admission    Admission diagnosis: Acute GI blood loss Additional comments: 2 units of PRBCs administered      05/13/2016 Code Status    FULL CODE      07/28/2016 Imaging    No acute process or evidence of metastatic disease in the chest. Similar size of a small posterior mediastinal node, not pathologic by size criteria.Status post right hemicolectomy. Mild soft tissue fullness in the region of the anastomosis for which locally recurrent disease or inflammation versus ischemia are differential considerations. 2. Mild progression of retroperitoneal adenopathy. 3. Similar metastasis within the right  para renal space and right posterior peritoneal surface.      08/30/2016 Imaging    No interval acute abnormality in the chest, abdomen or pelvis. 2. Stable mild right hydronephrosis due to obstructing mass centered at the right renal pelvis, which has mildly increased in size, most consistent with a retroperitoneal metastasis. 3. Additional sites of metastatic disease in the abdomen are stable, including confluent extensive retroperitoneal nodal metastases  and infiltrative right perinephric retroperitoneal metastases. 4. Stable masslike wall thickening at the ileocolic anastomosis, most consistent with local tumor recurrence. 5. Two subcentimeter hypodense liver lesions, for which 1 month stability has been demonstrated, liver metastases not excluded. 6. Previously demonstrated FDG avid posterior paraesophageal mediastinal lymph node is stable and top-normal size. Nodular 7 mm opacity in the basilar right lower lobe, for which 1 month stability has been demonstrated, lung metastasis not excluded. Otherwise no findings of metastatic disease in the chest.         HISTORY OF PRESENT ILLNESS:   Nathaniel Johnston is a 60 y.o. 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  Chart is reviewed in detail.  In summary, the patient was diagnosed with a Stage IIIC  In 2015.  He was treated definitively with surgery followed by adjuvant chemotherapy consisting of FOLFOX x 6 months.  Unfortunately, he had high-risk disease and recurrence was noted on PET imaging on 08/13/2015 when his CEA climbed.  He was therefore rechallenged with FOLFOX and did well until imaging on 6/28 showed progression of disease. He is currently on FOLFIRI/Erbitux.   Patient is accompanied by his friend, receiving cycle 10 of FOLFIRI. He states he is feeling well and denies pain. Reports a normal appetite. Last imaging has shown slight progression of disease, CEA is beginning to rise. He however continues to feel well. He notes excellent appetite. Energy is baseline.   No major complaints today.   Nathaniel Johnston currently has diarrhea, this is chronic. He does take imodium which alleviates it.  No fevers. No mouth sores.  Review of Systems  Constitutional: Negative for chills, fever and weight  loss.  HENT: Negative for sore throat.   Eyes: Negative for blurred vision and double vision.  Respiratory: Negative for cough, hemoptysis, sputum production, shortness of breath and wheezing.   Cardiovascular: Negative for chest pain and palpitations.  Gastrointestinal: Positive for diarrhea. Negative for abdominal pain, blood in stool, constipation, melena, nausea and vomiting.       Diarrhea managed with imodium and lomotil   Genitourinary: Negative for dysuria, frequency, hematuria and urgency.  Musculoskeletal: Negative for falls and myalgias.  Skin: Negative for itching and rash.  Neurological: Negative for dizziness and headaches.  Endo/Heme/Allergies: Does not bruise/bleed easily.  14 point review of systems was performed and is negative except as detailed under history of present illness and above    PAST MEDICAL HISTORY:   Past Medical History:  Diagnosis Date  . Adenocarcinoma of cecum ([place]) 03/08/2016  . Cancer ([place]) 07/2014   colon cancer surgery.  Finished Chemo 02/2015  . Chronic kidney disease   . Diabetes mellitus without complication ([place])   . Dialysis patient ([place]) 2010  . DNR (do not resuscitate) 03/11/2016  . ESRD (end stage renal disease) ([place]) 03/11/2016  . GERD (gastroesophageal reflux disease)   . High cholesterol   .  Hypertension   . Stroke [facility])    2003    ALLERGIES: No Known Allergies    MEDICATIONS: I have reviewed the patient's current medications.    Current Outpatient Prescriptions on File Prior to Visit  Medication Sig Dispense Refill  . amLODipine (NORVASC) 10 MG tablet Take 2 tablets by mouth daily.     . calcium acetate (PHOSLO) 667 MG capsule Take 1 capsule by mouth 3 (three) times daily with meals.     . cephALEXin (KEFLEX) 500 MG capsule Take 1 capsule (500 mg total) by mouth 4 (four) times daily. 28 capsule 0  . diphenoxylate-atropine (LOMOTIL) 2.5-0.025 MG tablet Take 1 tablet by mouth 4 (four) times daily as needed for diarrhea or  loose stools. 60 tablet 0  . doxycycline (VIBRA-TABS) 100 MG tablet Take 1 tablet (100 mg total) by mouth 2 (two) times daily. 30 tablet 1  . glipiZIDE (GLUCOTROL) 10 MG tablet Take 1 tablet by mouth 2 (two) times daily before a meal.     . HYDROcodone-acetaminophen (NORCO/VICODIN) 5-325 MG tablet Take 1 tablet by mouth every 6 (six) hours as needed for moderate pain. 20 tablet 0  . labetalol (NORMODYNE) 300 MG tablet Take 300 mg by mouth 2 (two) times daily.     Marland Kitchen lisinopril (PRINIVIL,ZESTRIL) 40 MG tablet Take 40 mg by mouth daily.     Marland Kitchen loperamide (IMODIUM) 2 MG capsule Take 2 capsules after the first loose stool and then 1 capsule every 2 hours until you go a total of 12 hours without having a loose stool. If it is bedtime and you are having loose stools take 2 capsules every 4 hours until morning. Call [place]. 90 capsule 0  . ofloxacin (OCUFLOX) 0.3 % ophthalmic solution Place 1 drop into both eyes 3 (three) times daily.    . ondansetron (ZOFRAN) 8 MG tablet Take 1 tablet (8 mg total) by mouth 2 (two) times daily as needed for refractory nausea / vomiting. Start on day 3 after chemotherapy. 30 tablet 1  . potassium chloride SA (K-DUR,KLOR-CON) 20 MEQ tablet Take 1 tablet (20 mEq total) by mouth daily. 30 tablet 0  . sevelamer carbonate (RENVELA) 800 MG tablet Take 800 mg by mouth 3 (three) times daily with meals.     . simvastatin (ZOCOR) 80 MG tablet Take 1 tablet by mouth daily at 6 PM.     . Skin Protectants, Misc. (EUCERIN) cream Apply 1 application topically as needed for dry skin.     Marland Kitchen triamcinolone cream (KENALOG) 0.1 % Apply 1 application topically daily as needed (for skin irritation).      No current facility-administered medications on file prior to visit.      PAST SURGICAL HISTORY Past Surgical History:  Procedure Laterality Date  . AMPUTATION TOE Left 2001  . CATARACT EXTRACTION W/PHACO Right 06/16/2015   Procedure: CATARACT EXTRACTION PHACO AND INTRAOCULAR LENS  PLACEMENT RIGHT EYE CDE=54.69;  Surgeon: Williams Che, MD;  Location: [facility] ORS;  Service: Ophthalmology;  Laterality: Right;  . COLECTOMY  07/2014  . DIALYSIS FISTULA CREATION Right   . ESOPHAGOGASTRODUODENOSCOPY N/A 05/06/2016   Procedure: ESOPHAGOGASTRODUODENOSCOPY (EGD);  Surgeon: Rogene Houston, MD;  Location: [facility] ENDO SUITE;  Service: Endoscopy;  Laterality: N/A;  . toe amputation Right 2000  . UPPER EXTREMITY ANGIOGRAM Right 05/03/2016   Procedure: RIGHT UPPER EXTREMITY ANGIOGRAM OF ARTERIAL VENOUS FISTULA , BALLOON ANGIOPLASTY;  Surgeon: Vickie Epley, MD;  Location: [facility] ORS;  Service: Vascular;  Laterality: Right;  .  YAG LASER APPLICATION Right 63/89/3734   Procedure: YAG LASER APPLICATION;  Surgeon: Williams Che, MD;  Location: [facility] ORS;  Service: Ophthalmology;  Laterality: Right;    FAMILY HISTORY: History reviewed. No pertinent family history.  He has a brother who lives nearby. He also has a sister His mother is living.  Her health is "good"  She is 75 yo Father is deceased in his 73's from unknown reason. He has a daughter and 2 grandbabies.  They live in [place] as she is married to a Nature conservation officer man.  SOCIAL HISTORY:  reports that he quit smoking about 25 years ago. His smoking use included Cigarettes. He has a 7.50 pack-year smoking history. He has never used smokeless tobacco. He reports that he does not drink alcohol or use drugs. He is a Engineer, manufacturing.  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  He retired with disability from 2 work injuries leading to bilateral amputation of #1 toe.  He ascertained disability in 2003.  Social History   Social History  . Marital status: Divorced    Spouse name: N/A  . Number of children: N/A  . Years of education: N/A   Occupational History  . Metal [employer]     [employer]   Social History Main Topics  . Smoking status: Former Smoker    Packs/day: 0.50    Years: 15.00    Types: Cigarettes     Quit date: 03/12/1991  . Smokeless tobacco: Never Used  . Alcohol use No  . Drug use: No  . Sexual activity: Not Asked   Other Topics Concern  . None   Social History Narrative  . None    PERFORMANCE STATUS: The patient's performance status is 2 - Symptomatic, <50% confined to bed  PHYSICAL EXAM: Vitals with BMI 09/09/2016  Height   Weight 237 lbs  BMI   Systolic 287  Diastolic 85  Pulse 86  Respirations 18    General appearance: alert, appears older than stated age, no distress, moderately obese and in treatment bed Head: Normocephalic, without obvious abnormality, atraumatic Throat: normal findings: oropharynx pink & moist without lesions or evidence of thrush Neck: supple, symmetrical, trachea midline Lungs: clear to auscultation bilaterally and normal percussion bilaterally Heart: regular rate and rhythm Extremities: edema B/L 1+ pitting edema pre-tibially without erythema or heat.  No tenderness to palpation.  Socks carry his cell phone and wallet. Skin: Skin color, texture, turgor normal. No rashes or lesions Neurologic: Grossly normal  LABORATORY DATA:  I have reviewed the data as listed below.  No results found for this or any previous visit (from the past 48 hour(s)).    Results for TERYN, GUST (MRN [id number])   Ref. Range 07/08/2016 11:23 07/22/2016 11:51 08/05/2016 11:07 08/26/2016 10:58  CEA Latest Ref Range: 0.0 - 4.7 ng/mL 170.8 (H) 207.8 (H) 218.2 (H) 228.4 (H)   Results for LARNIE, HEART (MRN [id number])   Ref. Range 09/09/2016 11:10  Sodium Latest Ref Range: 135 - 145 mmol/L 136  Potassium Latest Ref Range: 3.5 - 5.1 mmol/L 4.1  Chloride Latest Ref Range: 101 - 111 mmol/L 97 (L)  CO2 Latest Ref Range: 22 - 32 mmol/L 29  BUN Latest Ref Range: 6 - 20 mg/dL 25 (H)  Creatinine Latest Ref Range: 0.61 - 1.24 mg/dL 4.12 (H)  Calcium Latest Ref Range: 8.9 - 10.3 mg/dL 8.4 (L)  EGFR (Non-African Amer.) Latest Ref Range: >60 mL/min 14 (L)  EGFR (African  American) Latest Ref Range: >60 mL/min 16 (L)  Glucose Latest Ref Range: 65 - 99 mg/dL 88  Anion gap Latest Ref Range: 5 - 15  10  Magnesium Latest Ref Range: 1.7 - 2.4 mg/dL 2.2  Alkaline Phosphatase Latest Ref Range: 38 - 126 U/L 58  Albumin Latest Ref Range: 3.5 - 5.0 g/dL 3.3 (L)  AST Latest Ref Range: 15 - 41 U/L 19  ALT Latest Ref Range: 17 - 63 U/L 15 (L)  Total Protein Latest Ref Range: 6.5 - 8.1 g/dL 6.9  Total Bilirubin Latest Ref Range: 0.3 - 1.2 mg/dL 0.3  WBC Latest Ref Range: 4.0 - 10.5 K/uL 5.4  RBC Latest Ref Range: 4.22 - 5.81 MIL/uL 3.14 (L)  Hemoglobin Latest Ref Range: 13.0 - 17.0 g/dL 9.4 (L)  HCT Latest Ref Range: 39.0 - 52.0 % 29.8 (L)  MCV Latest Ref Range: 78.0 - 100.0 fL 94.9  MCH Latest Ref Range: 26.0 - 34.0 pg 29.9  MCHC Latest Ref Range: 30.0 - 36.0 g/dL 31.5  RDW Latest Ref Range: 11.5 - 15.5 % 18.8 (H)  Platelets Latest Ref Range: 150 - 400 K/uL 341  Neutrophils Latest Units: % 60  Lymphocytes Latest Units: % 22  Monocytes Relative Latest Units: % 12  Eosinophil Latest Units: % 6  Basophil Latest Units: % 0  NEUT# Latest Ref Range: 1.7 - 7.7 K/uL 3.2  Lymphocyte # Latest Ref Range: 0.7 - 4.0 K/uL 1.2  Monocyte # Latest Ref Range: 0.1 - 1.0 K/uL 0.7  Eosinophils Absolute Latest Ref Range: 0.0 - 0.7 K/uL 0.3  Basophils Absolute Latest Ref Range: 0.0 - 0.1 K/uL 0.0    RADIOGRAPHY: No results found.    I have reviewed the data as listed below.   Study Result   CLINICAL DATA:  60 year old male with a history of 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 Most recent chemotherapy 3 days prior. End-stage renal disease on hemodialysis.  EXAM: CT CHEST, ABDOMEN, AND PELVIS WITH CONTRAST  TECHNIQUE: Multidetector CT imaging of the chest, abdomen and pelvis was performed following the standard protocol  during bolus administration of intravenous contrast.  CONTRAST:  127m ISOVUE-300 IOPAMIDOL (ISOVUE-300) INJECTION 61%  COMPARISON:  07/28/2016 CT chest, abdomen and pelvis.  FINDINGS: CT CHEST FINDINGS  Cardiovascular: Normal heart size. Stable trace pericardial effusion/thickening. Left internal jugular MediPort terminates in the middle third of the superior vena cava. Left main, left anterior descending, left circumflex and right coronary atherosclerosis. Atherosclerotic nonaneurysmal thoracic aorta. Stable top-normal caliber pulmonary arteries. No central pulmonary emboli. A stable stent graft in the right subclavian vein.  Mediastinum/Nodes: No discrete thyroid nodules. Unremarkable esophagus. No pathologically enlarged axillary, mediastinal or hilar lymph nodes. Stable top-normal 0.9 cm posterior paraesophageal mediastinal node (series 2/image 30).  Lungs/Pleura: No pneumothorax. Stable trace bilateral pleural effusions. Nodular 7 mm basilar right lower lobe opacity (series 3/ image 132) is stable since 07/28/2016, not seen on 03/24/2016. No acute consolidative airspace disease, lung masses or new significant pulmonary nodules.  Musculoskeletal: No aggressive appearing focal osseous lesions. Moderate thoracic spondylosis. Mild symmetric gynecomastia.  CT ABDOMEN PELVIS FINDINGS  Hepatobiliary: Normal liver size. There are 2 subcentimeter hypodense liver lesions, too small to characterize, stable since 07/28/2016, not definitely seen on 03/24/2016. No new liver lesions. Cholelithiasis. No definite gallbladder wall thickening or pericholecystic fluid. Mild diffuse intrahepatic biliary ductal dilatation, stable. Mildly dilated common bile  duct (7 mm diameter), stable. No radiopaque choledocholithiasis.  Pancreas: Normal, with no mass or duct dilation.  Spleen: Normal size. No mass.  Adrenals/Urinary Tract: No discrete adrenal nodules. Symmetric  renal atrophy. A 4.1 x 3.6 cm mass centered in the region of the right renal pelvis (series 8/image 20) is increased from 3.6 x 3.4 cm on 07/28/2016. Stable mild right hydronephrosis. No left hydronephrosis. Subcentimeter hypodense renal cortical lesions in both kidneys are too small to characterize and appear stable. Irregular soft tissue nodularity in the posterior/inferior right perinephric retroperitoneal space measuring up to 4.1 x 2.9 cm in maximum axial dimensions (series 8/ image 37), unchanged. Normal bladder.  Stomach/Bowel: Grossly normal stomach. Status post right hemicolectomy with ileocolic anastomosis in the right abdomen. Stable masslike wall thickening at the ileocolic anastomosis (series 8/ image 46). No dilated small bowel loops. Oral contrast progresses to the distal small bowel. Moderate descending and sigmoid colonic diverticulosis. No additional sites of large bowel wall thickening or pericolonic fat stranding.  Vascular/Lymphatic: Atherosclerotic nonaneurysmal abdominal aorta. Patent portal, splenic, hepatic and renal veins. Stable right retrocaval adenopathy measuring up to 2.5 cm (series 2/ image 71). Stable confluent left para-aortic adenopathy measuring up to the 2.6 cm (series 2/ image 79). Stable aortocaval adenopathy measuring up to 1.8 cm (series 2/ image 84).  Reproductive: Normal size prostate.  Other: No pneumoperitoneum, ascites or focal fluid collection. Stable small fat containing right inguinal hernia.  Musculoskeletal: No aggressive appearing focal osseous lesions. Stable severe degenerative disc disease at L3-4 with associated eburnation.  IMPRESSION: 1. No interval acute abnormality in the chest, abdomen or pelvis. 2. Stable mild right hydronephrosis due to obstructing mass centered at the right renal pelvis, which has mildly increased in size, most consistent with a retroperitoneal metastasis. 3. Additional sites of metastatic  disease in the abdomen are stable, including confluent extensive retroperitoneal nodal metastases and infiltrative right perinephric retroperitoneal metastases. 4. Stable masslike wall thickening at the ileocolic anastomosis, most consistent with local tumor recurrence. 5. Two subcentimeter hypodense liver lesions, for which 1 month stability has been demonstrated, liver metastases not excluded. 6. Previously demonstrated FDG avid posterior paraesophageal mediastinal lymph node is stable and top-normal size. Nodular 7 mm opacity in the basilar right lower lobe, for which 1 month stability has been demonstrated, lung metastasis not excluded. Otherwise no findings of metastatic disease in the chest. 7. Cholelithiasis. Stable mild biliary ductal dilatation. No radiopaque choledocholithiasis. Recommend correlation with serum bilirubin levels. 8. Additional findings include aortic atherosclerosis, coronary atherosclerosis, trace bilateral pleural effusions, moderate distal colonic diverticulosis and small fat containing right inguinal hernia.   Electronically Signed   By: Ilona Sorrel M.D.   On: 08/30/2016 10:03   Study Result   CLINICAL DATA:  61 year old male with a history of 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 Most recent chemotherapy 3 days prior. End-stage renal disease on hemodialysis.  EXAM: CT CHEST, ABDOMEN, AND PELVIS WITH CONTRAST  TECHNIQUE: Multidetector CT imaging of the chest, abdomen and pelvis was performed following the standard protocol during bolus administration of intravenous contrast.  CONTRAST:  139m ISOVUE-300 IOPAMIDOL (ISOVUE-300) INJECTION 61%  COMPARISON:  07/28/2016 CT chest, abdomen and pelvis.  FINDINGS: CT CHEST FINDINGS  Cardiovascular: Normal heart size. Stable trace  pericardial effusion/thickening. Left internal jugular MediPort terminates in the middle third of the superior vena cava. Left main, left anterior descending, left circumflex and  right coronary atherosclerosis. Atherosclerotic nonaneurysmal thoracic aorta. Stable top-normal caliber pulmonary arteries. No central pulmonary emboli. A stable stent graft in the right subclavian vein.  Mediastinum/Nodes: No discrete thyroid nodules. Unremarkable esophagus. No pathologically enlarged axillary, mediastinal or hilar lymph nodes. Stable top-normal 0.9 cm posterior paraesophageal mediastinal node (series 2/image 30).  Lungs/Pleura: No pneumothorax. Stable trace bilateral pleural effusions. Nodular 7 mm basilar right lower lobe opacity (series 3/ image 132) is stable since 07/28/2016, not seen on 03/24/2016. No acute consolidative airspace disease, lung masses or new significant pulmonary nodules.  Musculoskeletal: No aggressive appearing focal osseous lesions. Moderate thoracic spondylosis. Mild symmetric gynecomastia.  CT ABDOMEN PELVIS FINDINGS  Hepatobiliary: Normal liver size. There are 2 subcentimeter hypodense liver lesions, too small to characterize, stable since 07/28/2016, not definitely seen on 03/24/2016. No new liver lesions. Cholelithiasis. No definite gallbladder wall thickening or pericholecystic fluid. Mild diffuse intrahepatic biliary ductal dilatation, stable. Mildly dilated common bile duct (7 mm diameter), stable. No radiopaque choledocholithiasis.  Pancreas: Normal, with no mass or duct dilation.  Spleen: Normal size. No mass.  Adrenals/Urinary Tract: No discrete adrenal nodules. Symmetric renal atrophy. A 4.1 x 3.6 cm mass centered in the region of the right renal pelvis (series 8/image 20) is increased from 3.6 x 3.4 cm on 07/28/2016. Stable mild right hydronephrosis. No left hydronephrosis. Subcentimeter hypodense renal cortical lesions in both kidneys  are too small to characterize and appear stable. Irregular soft tissue nodularity in the posterior/inferior right perinephric retroperitoneal space measuring up to 4.1 x 2.9 cm in maximum axial dimensions (series 8/ image 37), unchanged. Normal bladder.  Stomach/Bowel: Grossly normal stomach. Status post right hemicolectomy with ileocolic anastomosis in the right abdomen. Stable masslike wall thickening at the ileocolic anastomosis (series 8/ image 46). No dilated small bowel loops. Oral contrast progresses to the distal small bowel. Moderate descending and sigmoid colonic diverticulosis. No additional sites of large bowel wall thickening or pericolonic fat stranding.  Vascular/Lymphatic: Atherosclerotic nonaneurysmal abdominal aorta. Patent portal, splenic, hepatic and renal veins. Stable right retrocaval adenopathy measuring up to 2.5 cm (series 2/ image 71). Stable confluent left para-aortic adenopathy measuring up to the 2.6 cm (series 2/ image 79). Stable aortocaval adenopathy measuring up to 1.8 cm (series 2/ image 84).  Reproductive: Normal size prostate.  Other: No pneumoperitoneum, ascites or focal fluid collection. Stable small fat containing right inguinal hernia.  Musculoskeletal: No aggressive appearing focal osseous lesions. Stable severe degenerative disc disease at L3-4 with associated eburnation.  IMPRESSION: 1. No interval acute abnormality in the chest, abdomen or pelvis. 2. Stable mild right hydronephrosis due to obstructing mass centered at the right renal pelvis, which has mildly increased in size, most consistent with a retroperitoneal metastasis. 3. Additional sites of metastatic disease in the abdomen are stable, including confluent extensive retroperitoneal nodal metastases and infiltrative right perinephric retroperitoneal metastases. 4. Stable masslike wall thickening at the ileocolic anastomosis, most consistent with local tumor  recurrence. 5. Two subcentimeter hypodense liver lesions, for which 1 month stability has been demonstrated, liver metastases not excluded. 6. Previously demonstrated FDG avid posterior paraesophageal mediastinal lymph node is stable and top-normal size. Nodular 7 mm opacity in the basilar right lower lobe, for which 1 month stability has been demonstrated, lung metastasis not excluded. Otherwise no findings of metastatic disease in the chest. 7. Cholelithiasis. Stable mild biliary ductal dilatation. No radiopaque choledocholithiasis. Recommend correlation with serum bilirubin levels. 8. Additional findings include aortic atherosclerosis, coronary atherosclerosis, trace bilateral pleural effusions, moderate distal colonic diverticulosis  and small fat containing right inguinal hernia.   Electronically Signed   By: Ilona Sorrel M.D.   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 End-stage renal disease. On dialysis. Status post chemotherapy.  EXAM: CT CHEST, ABDOMEN, AND PELVIS WITH CONTRAST  TECHNIQUE: Multidetector CT imaging of the chest, abdomen and pelvis was performed following the standard protocol during bolus administration of intravenous contrast.  CONTRAST:  117m ISOVUE-300 IOPAMIDOL (ISOVUE-300) INJECTION 61%  COMPARISON:  03/24/2016 and chest radiograph of 07/25/2016.  FINDINGS: CT CHEST FINDINGS  Cardiovascular: Left-sided Port-A-Cath which terminates at the mid SVC. Aortic and branch vessel atherosclerosis. Normal heart size, without pericardial effusion. Multivessel coronary artery atherosclerosis. No central pulmonary embolism, on this non-dedicated study. Right subclavian venous stent is new.  Mediastinum/Nodes: No supraclavicular adenopathy. Edema about the right chest wall and axilla is likely related to prior venous insufficiency or the  presence of the stent. Mild bilateral gynecomastia, worse on the left. No mediastinal or hilar adenopathy. Posterior mediastinal node is similar and not pathologic by size criteria. Example at 9 mm on image 32/series 2. Dilated lower esophagus with contrast level within. Example image 35/series 2.  Lungs/Pleura: Trace bilateral pleural fluid. Azygos fissure. Subsegmental atelectasis or scarring in both lung bases.  Musculoskeletal: No acute osseous abnormality.  CT ABDOMEN PELVIS FINDINGS  Hepatobiliary: Mild motion degradation in the upper abdomen. A too small to characterize right hepatic lobe lesion image 76/series 2 is felt to be similar to on the prior. Small gallstones. No acute cholecystitis. Intrahepatic ducts are borderline prominent, similar. The common duct is normal.  Pancreas: Pancreatic atrophy, without dominant mass or duct dilatation.  Spleen: Normal in size, without focal abnormality.  Adrenals/Urinary Tract: Normal adrenal glands. Moderate bilateral renal atrophy. Interpolar too small to characterize right renal lesion. Decreased renal function, especially on the right. Right perinephric interstitial thickening again identified. The most nodular component measures on the order of 1.6 x 2.9 cm on image 85/series 2. Compare 1.9 x 2.9 cm on the prior exam (when remeasured). Normal urinary bladder.  Stomach/Bowel: Normal stomach, without wall thickening. Scattered colonic diverticula. Partial right hemicolectomy. Soft tissue thickening is identified in the region of the ileocolic anastomosis. Example image 90/series 2. This is superimposed upon and may be accentuated by underdistention. No obstruction.  Vascular/Lymphatic: Aortic and branch vessel atherosclerosis. Retroperitoneal adenopathy. An index left periaortic nodal mass measures 2.6 x 3.6 cm on image 80/series 2. Compare 2.6 x 3.3 cm on the prior. More inferior left periaortic node measures 2.2  cm on image 86/series 2. Compare 1.9 cm previously.  Preaortic 1.0 cm node on image 78/series 2 is enlarged from 9 mm previously.  Reproductive: Normal prostate.  Other: No significant free fluid. Thickening of the right lateral Conal fascia persists, including on image 87/series 2.  Left para midline tiny ventral fat containing pelvic hernia. Anterior abdominal wall laxity contains fat.  Musculoskeletal: Advanced degenerative disc disease at L3-4.  IMPRESSION: CT CHEST IMPRESSION  1. No acute process or evidence of metastatic disease in the chest. Similar size of a small posterior mediastinal node, not pathologic by size criteria. 2. Trace bilateral pleural fluid. 3. Esophageal air fluid level suggests dysmotility or gastroesophageal reflux. 4.  Coronary artery atherosclerosis. Aortic atherosclerosis.  CT ABDOMEN AND PELVIS IMPRESSION  1. Status post right hemicolectomy. Mild soft tissue fullness in the region of the anastomosis for which locally  recurrent disease or inflammation versus ischemia are differential considerations. 2. Mild progression of retroperitoneal adenopathy. 3. Similar metastasis within the right para renal space and right posterior peritoneal surface. 4. Cholelithiasis. 5.  Aortic atherosclerosis.   Electronically Signed   By: Abigail Miyamoto M.D.   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 End-stage renal disease. On dialysis. Status post chemotherapy.  EXAM: CT CHEST, ABDOMEN, AND PELVIS WITH CONTRAST  TECHNIQUE: Multidetector CT imaging of the chest, abdomen and pelvis was performed following the standard protocol during bolus administration of intravenous contrast.  CONTRAST:  152m ISOVUE-300 IOPAMIDOL (ISOVUE-300) INJECTION 61%  COMPARISON:  03/24/2016 and chest radiograph of 07/25/2016.  FINDINGS: CT CHEST  FINDINGS  Cardiovascular: Left-sided Port-A-Cath which terminates at the mid SVC. Aortic and branch vessel atherosclerosis. Normal heart size, without pericardial effusion. Multivessel coronary artery atherosclerosis. No central pulmonary embolism, on this non-dedicated study. Right subclavian venous stent is new.  Mediastinum/Nodes: No supraclavicular adenopathy. Edema about the right chest wall and axilla is likely related to prior venous insufficiency or the presence of the stent. Mild bilateral gynecomastia, worse on the left. No mediastinal or hilar adenopathy. Posterior mediastinal node is similar and not pathologic by size criteria. Example at 9 mm on image 32/series 2. Dilated lower esophagus with contrast level within. Example image 35/series 2.  Lungs/Pleura: Trace bilateral pleural fluid. Azygos fissure. Subsegmental atelectasis or scarring in both lung bases.  Musculoskeletal: No acute osseous abnormality.  CT ABDOMEN PELVIS FINDINGS  Hepatobiliary: Mild motion degradation in the upper abdomen. A too small to characterize right hepatic lobe lesion image 76/series 2 is felt to be similar to on the prior. Small gallstones. No acute cholecystitis. Intrahepatic ducts are borderline prominent, similar. The common duct is normal.  Pancreas: Pancreatic atrophy, without dominant mass or duct dilatation.  Spleen: Normal in size, without focal abnormality.  Adrenals/Urinary Tract: Normal adrenal glands. Moderate bilateral renal atrophy. Interpolar too small to characterize right renal lesion. Decreased renal function, especially on the right. Right perinephric interstitial thickening again identified. The most nodular component measures on the order of 1.6 x 2.9 cm on image 85/series 2. Compare 1.9 x 2.9 cm on the prior exam (when remeasured). Normal urinary bladder.  Stomach/Bowel: Normal stomach, without wall thickening. Scattered colonic diverticula. Partial  right hemicolectomy. Soft tissue thickening is identified in the region of the ileocolic anastomosis. Example image 90/series 2. This is superimposed upon and may be accentuated by underdistention. No obstruction.  Vascular/Lymphatic: Aortic and branch vessel atherosclerosis. Retroperitoneal adenopathy. An index left periaortic nodal mass measures 2.6 x 3.6 cm on image 80/series 2. Compare 2.6 x 3.3 cm on the prior. More inferior left periaortic node measures 2.2 cm on image 86/series 2. Compare 1.9 cm previously.  Preaortic 1.0 cm node on image 78/series 2 is enlarged from 9 mm previously.  Reproductive: Normal prostate.  Other: No significant free fluid. Thickening of the right lateral Conal fascia persists, including on image 87/series 2.  Left para midline tiny ventral fat containing pelvic hernia. Anterior abdominal wall laxity contains fat.  Musculoskeletal: Advanced degenerative disc disease at L3-4.  IMPRESSION: CT CHEST IMPRESSION  1. No acute process or evidence of metastatic disease in the chest. Similar size of a small posterior mediastinal node, not pathologic by size criteria. 2. Trace bilateral pleural fluid. 3. Esophageal air fluid level suggests dysmotility or gastroesophageal reflux. 4.  Coronary artery  atherosclerosis. Aortic atherosclerosis.  CT ABDOMEN AND PELVIS IMPRESSION  1. Status post right hemicolectomy. Mild soft tissue fullness in the region of the anastomosis for which locally recurrent disease or inflammation versus ischemia are differential considerations. 2. Mild progression of retroperitoneal adenopathy. 3. Similar metastasis within the right para renal space and right posterior peritoneal surface. 4. Cholelithiasis. 5.  Aortic atherosclerosis.   Electronically Signed   By: Abigail Miyamoto M.D.   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 Patient was restarted on FOLFOX with dosing according to dialysis requirements. Progression of disease noted by rising CEA and on imaging from 03/24/2016. He is currently on FOLFIRI/ERBITUX.   Will proceed forward with  FOLFIRI/Erbitux.He is currently receiving it Q2 weeks secondary to transportation issues. Drug is being dosed accordingly Imaging shows slight progression of disease with stability over a one month period by imaging. CEA is increasing.  Stivarga is not recommended in dialysis patients. Lonsurf dosing has not been studied but we will/can pursue this drug.   Reviewed results of 12/04 CT scan with patient. Scans indicate progression of disease. I explained to Shaheim that we are working on obtaining a new medication called LONSURF.   Patient was given two imodium to manage diarrhea.   RTC in two weeks.   All questions were answered. The patient knows to call the clinic with any problems, questions or concerns. We can certainly see the patient much sooner if necessary.  This document serves as a record of services personally performed by Ancil Linsey, MD. It was created on her behalf by Elmyra Ricks, a trained medical scribe. The creation of this record is based on the scribe's personal observations and the provider's statements to them. This document has been checked and approved by the attending provider.  I have reviewed the above documentation for accuracy and completeness and I agree with the above.  This note is electronically signed by: Patrici Ranks, MD  09/25/2016 10:29 AM

## 2016-09-09 NOTE — Patient Instructions (Addendum)
[place] at [facility] Discharge Instructions  RECOMMENDATIONS MADE BY THE CONSULTANT AND ANY TEST RESULTS WILL BE SENT TO YOUR REFERRING PHYSICIAN.  You saw Dr.Penland today. Start Lonsurf after chemo teaching. Follow up in 2 weeks with Tom with labs and chemo. See Amy at checkout for appointments.  Thank you for choosing [place] at [facility] to provide your oncology and hematology care.  To afford each patient quality time with our provider, please arrive at least 15 minutes before your scheduled appointment time.   Beginning January 23rd 2017 lab work for the [name] will be done in the  Main lab at [facility] on 1st floor. If you have a lab appointment with the [place] please come in thru the  Main Entrance and check in at the main information desk  You need to re-schedule your appointment should you arrive 10 or more minutes late.  We strive to give you quality time with our providers, and arriving late affects you and other patients whose appointments are after yours.  Also, if you no show three or more times for appointments you may be dismissed from the clinic at the providers discretion.     Again, thank you for choosing [facility].  Our hope is that these requests will decrease the amount of time that you wait before being seen by our physicians.       _____________________________________________________________  Should you have questions after your visit to [facility], please contact our office at [phone] between the hours of 8:30 a.m. and 4:30 p.m.  Voicemails left after 4:30 p.m. will not be returned until the following business day.  For prescription refill requests, have your pharmacy contact our office.         Resources For Cancer Patients and their Caregivers ? American Cancer Society: Can assist with transportation, wigs, general needs, runs Look Good Feel Better.         479-328-0197 ? Cancer Care: Provides financial assistance, online support groups, medication/co-pay assistance.  1-800-813-HOPE 918-653-9302) ? [facility] Assists [place] cancer patients and their families through emotional , educational and financial support.  806-888-4287 ? [name] DSS Where to apply for food stamps, Medicaid and utility assistance. 410-482-3685 ? [name]: Transportation to medical appointments. 7573045343 ? Social Security Administration: May apply for disability if have a Stage IV cancer. (848) 469-1768 551 379 2932 ? [employer], Disability and Transit Services: Assists with nutrition, care and transit needs. [place] Support Programs: @10RELATIVEDAYS @ > Cancer Support Group  2nd Tuesday of the month 1pm-2pm, [name]  > Creative Journey  3rd Tuesday of the month 1130am-1pm, [name]  > Look Good Feel Better  1st Wednesday of the month 10am-12 noon, [name] (Call [place] to register [phone])

## 2016-09-10 ENCOUNTER — Encounter (HOSPITAL_BASED_OUTPATIENT_CLINIC_OR_DEPARTMENT_OTHER): Payer: Medicare Other

## 2016-09-10 VITALS — BP 156/60 | HR 78 | Temp 98.3°F | Resp 18

## 2016-09-10 DIAGNOSIS — Z452 Encounter for adjustment and management of vascular access device: Secondary | ICD-10-CM | POA: Diagnosis not present

## 2016-09-10 DIAGNOSIS — C180 Malignant neoplasm of cecum: Secondary | ICD-10-CM | POA: Diagnosis not present

## 2016-09-10 LAB — CEA: CEA: 320.1 ng/mL — AB (ref 0.0–4.7)

## 2016-09-10 MED ORDER — SODIUM CHLORIDE 0.9% FLUSH
10.0000 mL | INTRAVENOUS | Status: DC | PRN
  Administered 2016-09-10: 10 mL
  Filled 2016-09-10: qty 10

## 2016-09-10 MED ORDER — HEPARIN SOD (PORK) LOCK FLUSH 100 UNIT/ML IV SOLN
500.0000 [IU] | Freq: Once | INTRAVENOUS | Status: AC | PRN
  Administered 2016-09-10: 500 [IU]

## 2016-09-10 MED ORDER — HEPARIN SOD (PORK) LOCK FLUSH 100 UNIT/ML IV SOLN
INTRAVENOUS | Status: AC
  Filled 2016-09-10: qty 5

## 2016-09-10 NOTE — Progress Notes (Signed)
Nathaniel Johnston tolerated 5FU pump well without complaints or incident. 5FU pump discontinued and port flushed with 10 ml NS and 5 ml Heparin easily per protocol.VSS Pt discharged via wheelchair in satisfactory condition accompanied by his friend

## 2016-09-10 NOTE — Patient Instructions (Signed)
[facility] at [facility] Discharge Instructions  RECOMMENDATIONS MADE BY THE CONSULTANT AND ANY TEST RESULTS WILL BE SENT TO YOUR REFERRING PHYSICIAN.  5FU pump discontinued and port flushed per protocol today. Follow-up as scheduled. Call clinic for any questions or concerns  Thank you for choosing [facility] at [facility] to provide your oncology and hematology care.  To afford each patient quality time with our provider, please arrive at least 15 minutes before your scheduled appointment time.   Beginning January 23rd 2017 lab work for the Cancer Center will be done in the  Main lab at [facility] on 1st floor. If you have a lab appointment with the Cancer Center please come in thru the  Main Entrance and check in at the main information desk  You need to re-schedule your appointment should you arrive 10 or more minutes late.  We strive to give you quality time with our providers, and arriving late affects you and other patients whose appointments are after yours.  Also, if you no show three or more times for appointments you may be dismissed from the clinic at the providers discretion.     Again, thank you for choosing [facility].  Our hope is that these requests will decrease the amount of time that you wait before being seen by our physicians.       _____________________________________________________________  Should you have questions after your visit to [facility], please contact our office at [phone] between the hours of 8:30 a.m. and 4:30 p.m.  Voicemails left after 4:30 p.m. will not be returned until the following business day.  For prescription refill requests, have your pharmacy contact our office.         Resources For Cancer Patients and their Caregivers ? American Cancer Society: Can assist with transportation, wigs, general needs, runs Look Good Feel Better.        1-888-227-6333 ? Cancer  Care: Provides financial assistance, online support groups, medication/co-pay assistance.  1-800-813-HOPE (4673) ? [facility] Assists [name] cancer patients and their families through emotional , educational and financial support.  336-427-4357 ? [name] DSS Where to apply for food stamps, Medicaid and utility assistance. 336-342-1394 ? [name]: Transportation to medical appointments. 336-347-2287 ? Social Security Administration: May apply for disability if have a Stage IV cancer. 336-342-7796 1-800-772-1213 ? [name] Aging, Disability and Transit Services: Assists with nutrition, care and transit needs. 336-349-2343  Cancer Center Support Programs: @10RELATIVEDAYS@ > Cancer Support Group  2nd Tuesday of the month 1pm-2pm, [name]  > Creative Journey  3rd Tuesday of the month 1130am-1pm, [name]  > Look Good Feel Better  1st Wednesday of the month 10am-12 noon, [name] (Call American Cancer Society to register 1-800-395-5775)   

## 2016-09-13 NOTE — Patient Instructions (Addendum)
[place]   CHEMOTHERAPY INSTRUCTIONS  You have Stage 4 Colon Cancer.  You have progression of disease from your last imagining.  We are switching your therapy to Lonsuf.  This therapy is with palliative intent, which means you are treatable but not curable.  You will stay on lonsurf until you have had progression of disease or until you can no longer tolerate the drug.   You will see the doctor regularly throughout treatment.  We monitor your lab work prior to every treatment.  The doctor monitors your response to treatment by the way you are feeling, your blood work, and scans periodically.    POTENTIAL SIDE EFFECTS OF TREATMENT:  How Trifluridine/tipiricil Is Given: Trifluridine/tipiricil is tablet that is taken by mouth.  Take twice a day within one hour of eating your morning and evening meals.  Take with a glass of water.  The tablets come in two strengths and you may need to take a combination of the two strengths to get to your specific dose. Follow yours instructions carefully.  Trifluridine/tipiricil is usually taken twice a day on days 1 to 5, and days 8 to 12 of a 28 day cycle. The amount of Trifluridine/tipiricil that you will receive depends on many factors, including your height and weight, your general health or other health problems, and the type of cancer or condition you have. Your doctor will determine your exact dosage and schedule  LONSURF (trifluridine and tipiracil), a prescription medicine, is for people: . Whose colon or rectal cancer has spread to other parts of the body (this is called "metastatic")  How it works LONSURF is the first FDA-approved combination tablet for refractory metastatic colon or rectal cancer. It is an oral chemotherapy that is 2 medicines in 1. . One part helps the other part stay active and work properly  . The other part stops cells from making copies of themselves. This may help stop tumors from  growing  Important information and side effects Your healthcare provider should check your blood cell counts before you receive LONSURF, at day 15 during treatment, and as needed. . Low blood counts are common with LONSURF and can sometimes be severe and life-threatening. LONSURF can cause a decrease in your white blood cells, red blood cells, and platelets. Low white blood cells can make you more likely to get serious infections that could lead to death. Your healthcare provider may lower your dose or stop LONSURF if you have low white blood cell or low platelet counts Almost all patients treated with LONSURF experience side effects at some time. Some common side effects you may experience include: Marland Kitchen Tiredness  . Nausea  . Vomiting  . Decreased appetite  . Diarrhea  . Abdominal pain  . Fever Tell your healthcare provider if you have nausea, vomiting, or diarrhea that is severe or that does not go away. These are not all of the possible side effects of LONSURF.  The most common side effects with LONSURF include tiredness, nausea, decreased appetite, diarrhea, vomiting, abdominal pain, and fever  When to [facility] Provider Contact your health care provider immediately, day or night, if you should experience any of the following symptoms; Fever of 100.5 F (38 C) or higher, chills (possible signs of infection) The following symptoms require medical attention, but are not an emergency. Contact your health care provider within 24 hours of noticing any of the following: Nausea (interferes with ability to eat and unrelieved with  prescribed medication)  Vomiting (vomiting more than 4-5 times in a 24 hour period)  Diarrhea (4-6 episodes in a 24-hour period)  Unusual bleeding or bruising  Black or tarry stools, or blood in your stools  Blood in the urine  Pain or burning with urination  Extreme fatigue (unable to carry on self-care activities)  Signs of infection such as redness  or swelling, pain on swallowing, coughing up mucous, or painful urination.  Unable to eat or drink for 24 hours or have signs of dehydration: tiredness, thirst, dry mouth, dark and decrease amount of urine, or dizziness. Always inform your health care provider if you experience any unusual symptoms. Precautions Before starting trifluridine/tipiricil treatment, make sure you tell your doctor about any other medications you are taking (including prescriptions, over-the-counter, vitamins, herbal remedies, etc.). Do not take aspirin, products containing aspirin unless your doctor specifically permits this.  Do not receive any kind of immunization or vaccination without your doctor's approval while taking trifluridine/tipiricil.  Inform your health care professional if you are pregnant or may be pregnant prior to starting this treatment. Pregnancy category X (Trifluridine/tipiricil may cause fetal harm when given to a pregnant woman. This drug must not be given to a pregnant woman or a woman who intends to become pregnant. If a woman becomes pregnant while taking trifluridine/tipiricil, the medication must be stopped immediately and the woman given appropriate counseling).  For both men and women: Use contraceptives, and do not conceive a child (get pregnant) while taking trifluridine/tipiricil. Barrier methods of contraception, such as condoms, are recommended. Males, with male partners who may become pregnant, should use condoms during their treatment with trifluridine/tipiricil and for at least 3 months after the last dose.  Do not breast feed while taking trifluridine/tipiricil. Self-Care Tips Drink at least two to three quarts of fluid every 24 hours, unless you are instructed otherwise.  You may be at risk of infection so try to avoid crowds or people with colds, and report fever or any other signs of infection immediately to your health care provider.  Wash your hands often.  Use an electric razor  and a soft toothbrush to minimize bleeding.  Avoid contact sports or activities that could cause injury.  To reduce nausea, take anti-nausea medications as prescribed by your doctor, and eat small, frequent meals.  Follow regimen of anti-diarrhea medication as prescribed by your health care professional.  Avoid sun exposure. Wear SPF 71 (or higher) sunblock and protective clothing.  In general, drinking alcoholic beverages should be kept to a minimum or avoided completely. You should discuss this with your doctor.  Get plenty of rest.  Maintain good nutrition.  Remain active as you are able. Gentle exercise is encouraged such as a daily walk.  If you experience symptoms or side effects, be sure to discuss them with your health care team. They can prescribe medications and/or offer other suggestions that are effective in managing such problems.    EDUCATIONAL MATERIALS GIVEN AND REVIEWED: Chemotherapy and you book, nutrition given,  Information on lonsurf    SELF CARE ACTIVITIES WHILE ON CHEMOTHERAPY: Hydration Increase your fluid intake 48 hours prior to treatment and drink at least 8 to 12 cups (64 ounces) of water/decaff beverages per day after treatment. You can still have your cup of coffee or soda but these beverages do not count as part of your 8 to 12 cups that you need to drink daily. No alcohol intake.  Medications Continue taking your normal prescription medication as  prescribed.  If you start any new herbal or new supplements please let us know first to make sure it is safe.  Mouth Care Have teeth cleaned professionally before starting treatment. Keep dentures and partial plates clean. Use soft toothbrush and do not use mouthwashes that contain alcohol. Biotene is a good mouthwash that is available at most pharmacies or may be ordered by calling [phone]. Use warm salt water gargles (1 teaspoon salt per 1 quart warm water) before and after meals and at bedtime. Or you may  rinse with 2 tablespoons of three-percent hydrogen peroxide mixed in eight ounces of water. If you are still having problems with your mouth or sores in your mouth please call the clinic. If you need dental work, please let Dr. Whitney Muse know before you go for your appointment so that we can coordinate the best possible time for you in regards to your chemo regimen. You need to also let your dentist know that you are actively taking chemo. We may need to do labs prior to your dental appointment.   Skin Care Always use sunscreen that has not expired and with SPF (Sun Protection Factor) of 50 or higher. Wear hats to protect your head from the sun. Remember to use sunscreen on your hands, ears, face, & feet.  Use good moisturizing lotions such as udder cream, eucerin, or even Vaseline. Some chemotherapies can cause dry skin, color changes in your skin and nails.    . Avoid long, hot showers or baths. . Use gentle, fragrance-free soaps and laundry detergent. . Use moisturizers, preferably creams or ointments rather than lotions because the thicker consistency is better at preventing skin dehydration. Apply the cream or ointment within 15 minutes of showering. Reapply moisturizer at night, and moisturize your hands every time after you wash them.  Hair Loss (if your doctor says your hair will fall out)  . If your doctor says that your hair is likely to fall out, decide before you begin chemo whether you want to wear a wig. You may want to shop before treatment to match your hair color. . Hats, turbans, and scarves can also camouflage hair loss, although some people prefer to leave their heads uncovered. If you go bare-headed outdoors, be sure to use sunscreen on your scalp. . Cut your hair short. It eases the inconvenience of shedding lots of hair, but it also can reduce the emotional impact of watching your hair fall out. . Don't perm or color your hair during chemotherapy. Those chemical treatments are  already damaging to hair and can enhance hair loss. Once your chemo treatments are done and your hair has grown back, it's OK to resume dyeing or perming hair. With chemotherapy, hair loss is almost always temporary. But when it grows back, it may be a different color or texture. In older adults who still had hair color before chemotherapy, the new growth may be completely gray.  Often, new hair is very fine and soft.  Infection Prevention Please wash your hands for at least 30 seconds using warm soapy water. Handwashing is the #1 way to prevent the spread of germs. Stay away from sick people or people who are getting over a cold. If you develop respiratory systems such as green/yellow mucus production or productive cough or persistent cough let us know and we will see if you need an antibiotic. It is a good idea to keep a pair of gloves on when going into grocery stores/[employer] to decrease your risk  of coming into contact with germs on the carts, etc. Carry alcohol hand gel with you at all times and use it frequently if out in public. If your temperature reaches 100.5 or higher please call the clinic and let us know.  If it is after hours or on the weekend please go to the ER if your temperature is over 100.5.  Please have your own personal thermometer at home to use.    Sex and bodily fluids If you are going to have sex, a condom must be used to protect the person that isn't taking chemotherapy. Chemo can decrease your libido (sex drive). For a few days after chemotherapy, chemotherapy can be excreted through your bodily fluids.  When using the toilet please close the lid and flush the toilet twice.  Do this for a few day after you have had chemotherapy.     Effects of chemotherapy on your sex life Some changes are simple and won't last long. They won't affect your sex life permanently. Sometimes you may feel: . too tired . not strong enough to be very active . sick or sore  . not in the  mood . anxious or low Your anxiety might not seem related to sex. For example, you may be worried about the cancer and how your treatment is going. Or you may be worried about money, or about how you family are coping with your illness. These things can cause stress, which can affect your interest in sex. It's important to talk to your partner about how you feel. Remember - the changes to your sex life don't usually last long. There's usually no medical reason to stop having sex during chemo. The drugs won't have any long term physical effects on your performance or enjoyment of sex. Cancer can't be passed on to your partner during sex  Contraception It's important to use reliable contraception during treatment. Avoid getting pregnant while you or your partner are having chemotherapy. This is because the drugs may harm the baby. Sometimes chemotherapy drugs can leave a man or woman infertile.  This means you would not be able to have children in the future. You might want to talk to someone about permanent infertility. It can be very difficult to learn that you may no longer be able to have children. Some people find counselling helpful. There might be ways to preserve your fertility, although this is easier for men than for women. You may want to speak to a fertility expert. You can talk about sperm banking or harvesting your eggs. You can also ask about other fertility options, such as donor eggs. If you have or have had breast cancer, your doctor might advise you not to take the contraceptive pill. This is because the hormones in it might affect the cancer.  It is not known for sure whether or not chemotherapy drugs can be passed on through semen or secretions from the vagina. Because of this some doctors advise people to use a barrier method if you have sex during treatment. This applies to vaginal, anal or oral sex. Generally, doctors advise a barrier method only for the time you are actually having  the treatment and for about a week after your treatment. Advice like this can be worrying, but this does not mean that you have to avoid being intimate with your partner. You can still have close contact with your partner and continue to enjoy sex.  Animals If you have cats or birds we just ask  that you not change the litter or change the cage.  Please have someone else do this for you while you are on chemotherapy.   Food Safety During and After Cancer Treatment Food safety is important for people both during and after cancer treatment. Cancer and cancer treatments, such as chemotherapy, radiation therapy, and stem cell/bone marrow transplantation, often weaken the immune system. This makes it harder for your body to protect itself from foodborne illness, also called food poisoning. Foodborne illness is caused by eating food that contains harmful bacteria, parasites, or viruses.  Foods to avoid Some foods have a higher risk of becoming tainted with bacteria. These include: Marland Kitchen Unwashed fresh fruit and vegetables, especially leafy vegetables that can hide dirt and other contaminants . Raw sprouts, such as alfalfa sprouts . Raw or undercooked beef, especially ground beef, or other raw or undercooked meat and poultry . Fatty, fried, or spicy foods immediately before or after treatment.  These can sit heavy on your stomach and make you feel nauseous. . Raw or undercooked shellfish, such as oysters. . Sushi and sashimi, which often contain raw fish.  . Unpasteurized beverages, such as unpasteurized fruit juices, raw milk, raw yogurt, or cider . Undercooked eggs, such as soft boiled, over easy, and poached; raw, unpasteurized eggs; or foods made with raw egg, such as homemade raw cookie dough and homemade mayonnaise Simple steps for food safety Shop smart. . Do not buy food stored or displayed in an unclean area. . Do not buy bruised or damaged fruits or vegetables. . Do not buy cans that have  cracks, dents, or bulges. . Pick up foods that can spoil at the end of your shopping trip and store them in a cooler on the way home. Prepare and clean up foods carefully. . Rinse all fresh fruits and vegetables under running water, and dry them with a clean towel or paper towel. . Clean the top of cans before opening them. . After preparing food, wash your hands for 20 seconds with hot water and soap. Pay special attention to areas between fingers and under nails. . Clean your utensils and dishes with hot water and soap. Marland Kitchen Disinfect your kitchen and cutting boards using 1 teaspoon of liquid, unscented bleach mixed into 1 quart of water.   Dispose of old food. . Eat canned and packaged food before its expiration date (the "use by" or "best before" date). . Consume refrigerated leftovers within 3 to 4 days. After that time, throw out the food. Even if the food does not smell or look spoiled, it still may be unsafe. Some bacteria, such as Listeria, can grow even on foods stored in the refrigerator if they are kept for too long. Take precautions when eating out. . At restaurants, avoid buffets and salad bars where food sits out for a long time and comes in contact with many people. Food can become contaminated when someone with a virus, often a norovirus, or another "bug" handles it. . Put any leftover food in a "to-go" container yourself, rather than having the server do it. And, refrigerate leftovers as soon as you get home. . Choose restaurants that are clean and that are willing to prepare your food as you order it cooked.   MEDICATIONS:  Zofran/Ondansetron 8mg  tablet. Take 1 tablet every 8 hours as needed for nausea/vomiting. (#1 nausea med to take, this can constipate)  Compazine/Prochlorperazine 10mg  tablet. Take 1 tablet every 6 hours as  needed for nausea/vomiting. (#2 nausea med to take, this can make you sleepy)   EMLA cream. Apply a quarter size amount to port site 1 hour prior to chemo. Do not rub in. Cover with plastic wrap.   Over-the-Counter Meds:  Miralax 1 capful in 8 oz of fluid daily. May increase to two times a day if needed. This is a stool softener. If this doesn't work proceed you can add:  Senokot S-start with 1 tablet two times a day and increase to 4 tablets two times a day if needed. (total of 8 tablets in a 24 hour period). This is a stimulant laxative.   Call us if this does not help your bowels move.   Imodium 2mg  capsule. Take 2 capsules after the 1st loose stool and then 1 capsule every 2 hours until you go a total of 12 hours without having a loose stool. Call the [facility] if loose stools continue. If diarrhea occurs @ bedtime, take 2 capsules @ bedtime. Then take 2 capsules every 4 hours until morning. Call [facility].    Diarrhea Sheet  If you are having loose stools/diarrhea, please purchase Imodium and begin taking as outlined:  At the first sign of poorly formed or loose stools you should begin taking Imodium(loperamide) 2 mg capsules.  Take two caplets (4mg ) followed by one caplet (2mg ) every 2 hours until you have had no diarrhea for 12 hours.  During the night take two caplets (4mg ) at bedtime and continue every 4 hours during the night until the morning.  Stop taking Imodium only after there is no sign of diarrhea for 12 hours.    Always call the [facility] if you are having loose stools/diarrhea that you can't get under control.  Loose stools/disrrhea leads to dehydration (loss of water) in your body.  We have other options of trying to get the loose stools/diarrhea to stopped but you must let us know!    Constipation Sheet *Miralax in 8 oz of fluid daily.  May increase to two times a day if needed.  This is a stool softener.  If this not enough to keep your bowel  regular:  You can add:  *Senokot S, start with one tablet twice a day and can increase to 4 tablets twice a day if needed.  This is a stimulant laxative.   Sometimes when you take pain medication you need BOTH a medicine to keep your stool soft and a medicine to help your bowel push it out!  Please call if the above does not work for you.   Do not go more than 2 days without a bowel movement.  It is very important that you do not become constipated.  It will make you feel sick to your stomach (nausea) and can cause abdominal pain and vomiting.     Nausea Sheet  Zofran/Ondansetron 8mg  tablet. Take 1 tablet every 8 hours as needed for nausea/vomiting. (#1 nausea med to take, this can constipate)  Compazine/Prochlorperazine 10mg  tablet. Take 1 tablet every 6 hours as needed for nausea/vomiting. (#2 nausea med to take, this can make you sleepy)  You can take these medications together or separately.  We would first like for you to try the Ondansetron by itself and then take the Prochloperizine if needed. But you  are allowed to take both medications at the same time if your nausea is that severe.  If you are having persistent nausea (nausea that does not stop) please take these medications on a staggered schedule so that the nausea medication stays in your body.  Please call the [facility] and let us know the amount of nausea that you are experiencing.  If you begin to vomit, you need to call the [facility] and if it is the weekend and you have vomited more than one time and cant get it to stop-go to the Emergency Room.  Persistent nausea/vomiting can lead to dehydration (loss of fluid in your body) and will make you feel terrible.   Ice chips, sips of clear liquids, foods that are @ room temperature, crackers, and toast tend to be better tolerated.     SYMPTOMS TO REPORT AS SOON AS POSSIBLE AFTER TREATMENT:  FEVER GREATER THAN 100.5 F  CHILLS WITH OR WITHOUT FEVER  NAUSEA AND  VOMITING THAT IS NOT CONTROLLED WITH YOUR NAUSEA MEDICATION  UNUSUAL SHORTNESS OF BREATH  UNUSUAL BRUISING OR BLEEDING  TENDERNESS IN MOUTH AND THROAT WITH OR WITHOUT PRESENCE OF ULCERS  URINARY PROBLEMS  BOWEL PROBLEMS  UNUSUAL RASH    Wear comfortable clothing and clothing appropriate for easy access to any Portacath or PICC line. Let us know if there is anything that we can do to make your therapy better!    What to do if you need assistance after hours or on the weekends: CALL [phone].  HOLD on the line, do not hang up.  You will hear multiple messages but at the end you will be connected with a nurse triage line.  They will contact Dr Whitney Muse if necessary.  Most of the time they will be able to assist you.   Do not call the hospital operator.  Dr Whitney Muse will not answer phone calls received by them.      I have been informed and understand all of the instructions given to me and have received a copy. I have been instructed to call the clinic [phone] or my family physician as soon as possible for continued medical care, if indicated. I do not have any more questions at this time but understand that I may call the [facility] or the Patient Navigator at [phone] during office hours should I have questions or need assistance in obtaining follow-up care.

## 2016-09-15 ENCOUNTER — Encounter (HOSPITAL_COMMUNITY): Payer: Medicare Other

## 2016-09-15 ENCOUNTER — Encounter (HOSPITAL_COMMUNITY): Payer: Self-pay | Admitting: Emergency Medicine

## 2016-09-15 MED ORDER — PROCHLORPERAZINE MALEATE 10 MG PO TABS: 10.0000 mg | ORAL_TABLET | Freq: Four times a day (QID) | ORAL | 2 refills | Status: DC | PRN

## 2016-09-15 NOTE — Progress Notes (Signed)
Chemotherapy pulled together. 

## 2016-09-17 ENCOUNTER — Encounter (HOSPITAL_COMMUNITY): Payer: Self-pay

## 2016-09-17 ENCOUNTER — Ambulatory Visit (HOSPITAL_COMMUNITY)
Admission: RE | Admit: 2016-09-17 | Discharge: 2016-09-17 | Disposition: A | Discharge status: Home / Self Care | Payer: Medicare Other | Source: Ambulatory Visit | Attending: Oncology | Admitting: Oncology

## 2016-09-17 DIAGNOSIS — C180 Malignant neoplasm of cecum: Secondary | ICD-10-CM

## 2016-09-17 MED ORDER — IOPAMIDOL (ISOVUE-300) INJECTION 61%: 100.0000 mL | Freq: Once | INTRAVENOUS | Status: DC | PRN

## 2016-09-17 MED ORDER — HEPARIN SOD (PORK) LOCK FLUSH 100 UNIT/ML IV SOLN
INTRAVENOUS | Status: AC
  Filled 2016-09-17: qty 5

## 2016-09-23 ENCOUNTER — Ambulatory Visit (HOSPITAL_COMMUNITY): Payer: Medicare Other | Admitting: Oncology

## 2016-09-23 ENCOUNTER — Ambulatory Visit (HOSPITAL_COMMUNITY): Payer: Medicare Other

## 2016-09-24 ENCOUNTER — Encounter (HOSPITAL_COMMUNITY): Payer: Medicare Other

## 2016-09-25 ENCOUNTER — Encounter (HOSPITAL_COMMUNITY): Payer: Self-pay | Admitting: Hematology & Oncology

## 2016-09-25 DIAGNOSIS — Z7189 Other specified counseling: Secondary | ICD-10-CM | POA: Insufficient documentation

## 2016-09-28 ENCOUNTER — Encounter (HOSPITAL_COMMUNITY): Payer: Self-pay | Admitting: Emergency Medicine

## 2016-09-28 ENCOUNTER — Emergency Department (HOSPITAL_COMMUNITY)
Admission: EM | Admit: 2016-09-28 | Discharge: 2016-09-28 | Disposition: A | Discharge status: Home / Self Care | Payer: Medicare Other | Attending: Emergency Medicine | Admitting: Emergency Medicine

## 2016-09-28 ENCOUNTER — Emergency Department (HOSPITAL_COMMUNITY): Payer: Medicare Other

## 2016-09-28 DIAGNOSIS — R109 Unspecified abdominal pain: Secondary | ICD-10-CM

## 2016-09-28 DIAGNOSIS — N186 End stage renal disease: Secondary | ICD-10-CM | POA: Diagnosis not present

## 2016-09-28 DIAGNOSIS — Z87891 Personal history of nicotine dependence: Secondary | ICD-10-CM | POA: Insufficient documentation

## 2016-09-28 DIAGNOSIS — C180 Malignant neoplasm of cecum: Secondary | ICD-10-CM

## 2016-09-28 DIAGNOSIS — Z7984 Long term (current) use of oral hypoglycemic drugs: Secondary | ICD-10-CM | POA: Diagnosis not present

## 2016-09-28 DIAGNOSIS — I120 Hypertensive chronic kidney disease with stage 5 chronic kidney disease or end stage renal disease: Secondary | ICD-10-CM | POA: Insufficient documentation

## 2016-09-28 DIAGNOSIS — K5900 Constipation, unspecified: Secondary | ICD-10-CM

## 2016-09-28 DIAGNOSIS — Z79899 Other long term (current) drug therapy: Secondary | ICD-10-CM | POA: Diagnosis not present

## 2016-09-28 DIAGNOSIS — Z992 Dependence on renal dialysis: Secondary | ICD-10-CM | POA: Diagnosis not present

## 2016-09-28 DIAGNOSIS — E1122 Type 2 diabetes mellitus with diabetic chronic kidney disease: Secondary | ICD-10-CM | POA: Diagnosis not present

## 2016-09-28 LAB — COMPREHENSIVE METABOLIC PANEL
ALT: 12 U/L — ABNORMAL LOW (ref 17–63)
ANION GAP: 9 (ref 5–15)
AST: 18 U/L (ref 15–41)
Albumin: 3.4 g/dL — ABNORMAL LOW (ref 3.5–5.0)
Alkaline Phosphatase: 70 U/L (ref 38–126)
BUN: 34 mg/dL — AB (ref 6–20)
CHLORIDE: 97 mmol/L — AB (ref 101–111)
CO2: 31 mmol/L (ref 22–32)
Calcium: 8.5 mg/dL — ABNORMAL LOW (ref 8.9–10.3)
Creatinine, Ser: 6.06 mg/dL — ABNORMAL HIGH (ref 0.61–1.24)
GFR calc Af Amer: 10 mL/min — ABNORMAL LOW (ref >60)
GFR calc non Af Amer: 9 mL/min — ABNORMAL LOW (ref >60)
Glucose, Bld: 100 mg/dL — ABNORMAL HIGH (ref 65–99)
POTASSIUM: 5.2 mmol/L — AB (ref 3.5–5.1)
Sodium: 137 mmol/L (ref 135–145)
Total Bilirubin: 0.4 mg/dL (ref 0.3–1.2)
Total Protein: 7.3 g/dL (ref 6.5–8.1)

## 2016-09-28 LAB — CBC WITH DIFFERENTIAL/PLATELET
BASOS ABS: 0 10*3/uL (ref 0.0–0.1)
BASOS PCT: 1 %
EOS PCT: 4 %
Eosinophils Absolute: 0.2 10*3/uL (ref 0.0–0.7)
HCT: 32.8 % — ABNORMAL LOW (ref 39.0–52.0)
Hemoglobin: 10.5 g/dL — ABNORMAL LOW (ref 13.0–17.0)
LYMPHS PCT: 22 %
Lymphs Abs: 1 10*3/uL (ref 0.7–4.0)
MCH: 30.7 pg (ref 26.0–34.0)
MCHC: 32 g/dL (ref 30.0–36.0)
MCV: 95.9 fL (ref 78.0–100.0)
MONO ABS: 0.5 10*3/uL (ref 0.1–1.0)
Monocytes Relative: 11 %
Neutro Abs: 2.8 10*3/uL (ref 1.7–7.7)
Neutrophils Relative %: 62 %
PLATELETS: 385 10*3/uL (ref 150–400)
RBC: 3.42 MIL/uL — ABNORMAL LOW (ref 4.22–5.81)
RDW: 19.1 % — AB (ref 11.5–15.5)
WBC: 4.4 10*3/uL (ref 4.0–10.5)

## 2016-09-28 LAB — LIPASE, BLOOD: Lipase: 20 U/L (ref 11–51)

## 2016-09-28 MED ORDER — ACETAMINOPHEN 325 MG PO TABS
650.0000 mg | ORAL_TABLET | Freq: Once | ORAL | Status: AC
  Administered 2016-09-28: 650 mg via ORAL
  Filled 2016-09-28: qty 2

## 2016-09-28 NOTE — ED Provider Notes (Signed)
[facility] DEPT Provider Note   CSN: [place]:[id number] Arrival date & time: 09/28/16  [id number]  By signing my name below, I, Jeanell Sparrow, attest that this documentation has been prepared under the direction and in the presence of Nat Christen, MD . Electronically Signed: Jeanell Sparrow, Scribe. 09/28/2016. 9:41 AM.  History   Chief Complaint Chief Complaint  Patient presents with  . Abdominal Pain   The history is provided by the patient. No language interpreter was used.   HPI Comments: Nathaniel Johnston is a 61 y.o. male with a PMHx of End-stage renal disease who presents to the Emergency Department complaining of constant moderate lower abdominal pain that started about 5 days ago. He states he had dialysis today as part of a  Tuesday, Thursday, Saturday schedule. 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 He went to [facility] hospital yesterday and 3 days ago. He had an abdominal CT scan done with unknown results. He describes the pain as being worse on the right and radiating to his right flank. He reports associated constipation. He is able to eat small amounts. He denies any vomiting, diarrhea, fever, chills, diaphoresis, or other complaints.        Renal ultrasound and CT abdomen pelvis from 09/25/16 reviewed. There may be some pathology in the right renal pelvis which could represent cancer.   Past Medical History:  Diagnosis Date  . 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 Cancer ([place]) 07/2014   colon cancer surgery.  Finished Chemo 02/2015  . Chronic kidney disease   . Diabetes mellitus without complication ([place])   . Dialysis patient ([place]) 2010  . DNR (do not resuscitate) 03/11/2016  . ESRD (end stage renal disease) ([place]) 03/11/2016  . GERD (gastroesophageal reflux disease)   . High cholesterol   . Hypertension   . Stroke [facility])    2003    Patient Active Problem List   Diagnosis Date Noted  . Goals of care, counseling/discussion 09/25/2016  . 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 Acute blood loss anemia 05/05/2016  . GI bleed 05/04/2016  . Dialysis AV fistula malfunction ([place])   . Diarrhea 04/01/2016  . ESRD (end stage renal disease) ([place]) 03/11/2016  . 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 High cholesterol     Past Surgical History:  Procedure Laterality Date  . AMPUTATION TOE Left 2001  . CATARACT EXTRACTION W/PHACO Right 06/16/2015   Procedure: CATARACT EXTRACTION PHACO AND INTRAOCULAR LENS PLACEMENT RIGHT EYE CDE=54.69;  Surgeon: Williams Che, MD;  Location: [facility] ORS;  Service: Ophthalmology;  Laterality: Right;  . COLECTOMY  07/2014  . DIALYSIS FISTULA CREATION Right   . ESOPHAGOGASTRODUODENOSCOPY N/A 05/06/2016   Procedure: ESOPHAGOGASTRODUODENOSCOPY (EGD);  Surgeon: Rogene Houston, MD;  Location: [facility] ENDO SUITE;  Service: Endoscopy;  Laterality: N/A;  . toe amputation Right 2000  . UPPER EXTREMITY ANGIOGRAM Right 05/03/2016   Procedure: RIGHT UPPER EXTREMITY ANGIOGRAM OF ARTERIAL VENOUS FISTULA , BALLOON ANGIOPLASTY;  Surgeon: Vickie Epley, MD;  Location: [facility] ORS;  Service: Vascular;  Laterality: Right;  . YAG LASER APPLICATION Right [id number]   Procedure: YAG LASER APPLICATION;  Surgeon: Williams Che, MD;  Location: [facility] ORS;  Service: Ophthalmology;  Laterality: Right;       Home Medications    Prior to Admission medications   Medication Sig Start Date End Date Taking? Authorizing Provider  amLODipine (NORVASC) 10 MG tablet Take 2 tablets by mouth daily.  07/29/14  Yes Historical Provider, MD  calcium acetate (PHOSLO) 667 MG capsule Take 1 capsule by mouth 3 (three) times daily with meals.  07/15/14  Yes Historical Provider, MD  glipiZIDE (GLUCOTROL) 10 MG tablet Take 1 tablet by mouth 2 (two) times daily before a meal.  07/01/14  Yes Historical Provider, MD  lisinopril (PRINIVIL,ZESTRIL) 40 MG tablet Take 40 mg by mouth daily.  06/02/10  Yes Historical Provider, MD  loperamide  (IMODIUM) 2 MG capsule Take 2 capsules after the first loose stool and then 1 capsule every 2 hours until you go a total of 12 hours without having a loose stool. If it is bedtime and you are having loose stools take 2 capsules every 4 hours until morning. Call [place]. 08/26/16  Yes Manon Hilding Kefalas, PA-C  ofloxacin (OCUFLOX) 0.3 % ophthalmic solution Place 1 drop into both eyes 3 (three) times daily. 07/19/16  Yes Historical Provider, MD  potassium chloride SA (K-DUR,KLOR-CON) 20 MEQ tablet Take 1 tablet (20 mEq total) by mouth daily. 04/06/16  Yes Baird Cancer, PA-C  sevelamer carbonate (RENVELA) 800 MG tablet Take 800 mg by mouth 3 (three) times daily with meals.  07/30/14  Yes Historical Provider, MD  simvastatin (ZOCOR) 80 MG tablet Take 1 tablet by mouth daily at 6 PM.  08/17/14  Yes Historical Provider, MD  cephALEXin (KEFLEX) 500 MG capsule Take 1 capsule (500 mg total) by mouth 4 (four) times daily. Patient not taking: Reported on 09/28/2016 08/30/16   Milton Ferguson, MD  diphenoxylate-atropine (LOMOTIL) 2.5-0.025 MG tablet Take 1 tablet by mouth 4 (four) times daily as needed for diarrhea or loose stools. 08/26/16   Baird Cancer, PA-C  HYDROcodone-acetaminophen (NORCO/VICODIN) 5-325 MG tablet Take 1 tablet by mouth every 6 (six) hours as needed for moderate pain. Patient not taking: Reported on 09/28/2016 08/30/16   Milton Ferguson, MD  Skin Protectants, Misc. (EUCERIN) cream Apply 1 application topically as needed for dry skin.  08/29/14   Historical Provider, MD  triamcinolone cream (KENALOG) 0.1 % Apply 1 application topically daily as needed (for skin irritation).  06/05/14   Historical Provider, MD    Family History History reviewed. No pertinent family history.  Social History Social History  Substance Use Topics  . Smoking status: Former Smoker    Packs/day: 0.50    Years: 15.00    Types: Cigarettes    Quit date: 03/12/1991  . Smokeless tobacco: Never Used  . Alcohol use No      Allergies   Patient has no known allergies.   Review of Systems Review of Systems A complete 10 system review of systems was obtained and all systems are negative except as noted in the HPI and PMH.   Physical Exam Updated Vital Signs BP 154/85 (BP Location: Left Arm)   Pulse 97   Temp 97.8 F (36.6 C) (Oral)   Resp 20   Ht 6\' 1"  (1.854 m)   Wt 230 lb (104.3 kg)   SpO2 100%   BMI 30.34 kg/m   Physical Exam  Constitutional: He is oriented to person, place, and time. He appears well-developed and well-nourished.  No acute distress.   HENT:  Head: Normocephalic and atraumatic.  Eyes: Conjunctivae are normal.  Neck: Neck supple.  Cardiovascular: Normal rate and regular rhythm.   Pulmonary/Chest: Effort normal and breath sounds normal.  Abdominal: Soft. Bowel sounds are normal. There is tenderness.  Minimal TTP to lower abdomen and right flank.   Musculoskeletal: Normal  range of motion.  Neurological: He is alert and oriented to person, place, and time.  Skin: Skin is warm and dry.  Psychiatric: He has a normal mood and affect. His behavior is normal.  Nursing note and vitals reviewed.    ED Treatments / Results  DIAGNOSTIC STUDIES: Oxygen Saturation is 100% on RA, normal by my interpretation.    COORDINATION OF CARE: 9:45 AM- Pt advised of plan for treatment, which includes obtaining CT abdominal scan results and lab workup, and pt agrees.  Labs (all labs ordered are listed, but only abnormal results are displayed) Labs Reviewed  CBC WITH DIFFERENTIAL/PLATELET - Abnormal; Notable for the following:       Result Value   RBC 3.42 (*)    Hemoglobin 10.5 (*)    HCT 32.8 (*)    RDW 19.1 (*)    All other components within normal limits  COMPREHENSIVE METABOLIC PANEL - Abnormal; Notable for the following:    Potassium 5.2 (*)    Chloride 97 (*)    Glucose, Bld 100 (*)    BUN 34 (*)    Creatinine, Ser 6.06 (*)    Calcium 8.5 (*)    Albumin 3.4 (*)    ALT  12 (*)    GFR calc non Af Amer 9 (*)    GFR calc Af Amer 10 (*)    All other components within normal limits  LIPASE, BLOOD    EKG  EKG Interpretation None       Radiology Dg Abdomen Acute W/chest  Result Date: 09/28/2016 CLINICAL DATA:  Patient states he had an hour left of dialysis when he left with EMS due to abd pain/constipation. Constipation. Hx of cecum cancer 03/08/2016, chronic kidney disease, diabetes, ESRD, GERD, hypertension, EGD, previous smoker. Due .*comment was truncated* EXAM: DG ABDOMEN ACUTE W/ 1V CHEST COMPARISON:  Chest CT 08/1929 17, Set abdominal films 09/27/2016 FINDINGS: LEFT-sided power port. RIGHT vascular subclavian stent. Normal cardiac silhouette. No pulmonary edema or pleural fluid. No dilated large or small bowel. Moderate volume stool in the ascending colon. Normal volume stool in the transverse descending and rectosigmoid colon. Surgical clips in the RIGHT lower quadrant. IMPRESSION: 1. No acute cardiopulmonary findings. 2. Moderate volume stool in the ascending colon. No evidence obstruction. Electronically Signed   By: Suzy Bouchard M.D.   On: 09/28/2016 10:16    Procedures Procedures (including critical care time)  Medications Ordered in ED Medications  acetaminophen (TYLENOL) tablet 650 mg (650 mg Oral Given 09/28/16 1206)     Initial Impression / Assessment and Plan / ED Course  I have reviewed the triage vital signs and the nursing notes.  Pertinent labs & imaging results that were available during my care of the patient were reviewed by me and considered in my medical decision making (see chart for details).  Clinical Course     No acute abdomen. CT and ultrasound done at [facility] on 09/25/16 reviewed. Patient may have some new cancer pathology in the right kidney. This was discussed with the patient and his family. He has a follow-up appointment with Dr. Whitney Muse on 09/30/16. I gave copies of the reports to the family member who  will show it to the doctor  Final Clinical Impressions(s) / ED Diagnoses   Final diagnoses:  Abdominal pain, unspecified abdominal location    New Prescriptions New Prescriptions   No medications on file   I personally performed the services described in this documentation, which was scribed in my  presence. The recorded information has been reviewed and is accurate.      Nat Christen, MD 09/29/16 1017

## 2016-09-28 NOTE — ED Notes (Signed)
Taken to xray.

## 2016-09-28 NOTE — Discharge Instructions (Signed)
X-ray shows constipation in the right side of your colon. Recommend magnesium citrate, Miralax, Dulcolax, Fleet's enema, fruits and fiber. CT scan of abdomen and pelvis from 09/25/16 reviewed with the patient and his family.

## 2016-09-28 NOTE — ED Notes (Signed)
Pt returned from xray. Lab aware 

## 2016-09-28 NOTE — ED Triage Notes (Signed)
Pt [place] pt. Pt had hour left of dialysis when left with EMS due to abd pain/constipation. Took hydrocodone early this am. A/o.

## 2016-09-28 NOTE — ED Notes (Addendum)
Patient requesting something for his headache. Dr. Lacinda Axon made aware.

## 2016-09-30 ENCOUNTER — Other Ambulatory Visit (HOSPITAL_COMMUNITY): Payer: Self-pay | Admitting: Oncology

## 2016-09-30 DIAGNOSIS — C180 Malignant neoplasm of cecum: Secondary | ICD-10-CM

## 2016-09-30 MED ORDER — HYDROCODONE-ACETAMINOPHEN 5-325 MG PO TABS: 1.0000 | ORAL_TABLET | Freq: Four times a day (QID) | ORAL | 0 refills | Status: AC | PRN

## 2016-10-01 ENCOUNTER — Encounter (HOSPITAL_COMMUNITY): Payer: Medicare Other

## 2016-10-01 ENCOUNTER — Encounter (HOSPITAL_COMMUNITY): Payer: Medicare Other | Attending: Oncology | Admitting: Oncology

## 2016-10-01 DIAGNOSIS — R911 Solitary pulmonary nodule: Secondary | ICD-10-CM | POA: Diagnosis not present

## 2016-10-01 DIAGNOSIS — E1122 Type 2 diabetes mellitus with diabetic chronic kidney disease: Secondary | ICD-10-CM | POA: Insufficient documentation

## 2016-10-01 DIAGNOSIS — Z87891 Personal history of nicotine dependence: Secondary | ICD-10-CM | POA: Insufficient documentation

## 2016-10-01 DIAGNOSIS — E875 Hyperkalemia: Secondary | ICD-10-CM | POA: Insufficient documentation

## 2016-10-01 DIAGNOSIS — E7800 Pure hypercholesterolemia, unspecified: Secondary | ICD-10-CM | POA: Diagnosis not present

## 2016-10-01 DIAGNOSIS — C180 Malignant neoplasm of cecum: Secondary | ICD-10-CM | POA: Insufficient documentation

## 2016-10-01 DIAGNOSIS — K219 Gastro-esophageal reflux disease without esophagitis: Secondary | ICD-10-CM | POA: Diagnosis not present

## 2016-10-01 DIAGNOSIS — C772 Secondary and unspecified malignant neoplasm of intra-abdominal lymph nodes: Secondary | ICD-10-CM | POA: Insufficient documentation

## 2016-10-01 DIAGNOSIS — N186 End stage renal disease: Secondary | ICD-10-CM | POA: Insufficient documentation

## 2016-10-01 DIAGNOSIS — Z992 Dependence on renal dialysis: Secondary | ICD-10-CM | POA: Insufficient documentation

## 2016-10-01 DIAGNOSIS — I120 Hypertensive chronic kidney disease with stage 5 chronic kidney disease or end stage renal disease: Secondary | ICD-10-CM | POA: Diagnosis not present

## 2016-10-01 DIAGNOSIS — C786 Secondary malignant neoplasm of retroperitoneum and peritoneum: Secondary | ICD-10-CM | POA: Diagnosis not present

## 2016-10-01 DIAGNOSIS — Z8673 Personal history of transient ischemic attack (TIA), and cerebral infarction without residual deficits: Secondary | ICD-10-CM | POA: Diagnosis not present

## 2016-10-01 DIAGNOSIS — Z66 Do not resuscitate: Secondary | ICD-10-CM | POA: Insufficient documentation

## 2016-10-01 LAB — CBC WITH DIFFERENTIAL/PLATELET
Basophils Absolute: 0 10*3/uL (ref 0.0–0.1)
Basophils Relative: 1 %
EOS PCT: 4 %
Eosinophils Absolute: 0.2 10*3/uL (ref 0.0–0.7)
HCT: 33.2 % — ABNORMAL LOW (ref 39.0–52.0)
Hemoglobin: 10.4 g/dL — ABNORMAL LOW (ref 13.0–17.0)
LYMPHS ABS: 1.1 10*3/uL (ref 0.7–4.0)
LYMPHS PCT: 20 %
MCH: 30.9 pg (ref 26.0–34.0)
MCHC: 31.3 g/dL (ref 30.0–36.0)
MCV: 98.5 fL (ref 78.0–100.0)
MONO ABS: 0.7 10*3/uL (ref 0.1–1.0)
MONOS PCT: 13 %
Neutro Abs: 3.6 10*3/uL (ref 1.7–7.7)
Neutrophils Relative %: 62 %
PLATELETS: 372 10*3/uL (ref 150–400)
RBC: 3.37 MIL/uL — AB (ref 4.22–5.81)
RDW: 19 % — ABNORMAL HIGH (ref 11.5–15.5)
WBC: 5.7 10*3/uL (ref 4.0–10.5)

## 2016-10-01 LAB — COMPREHENSIVE METABOLIC PANEL
ALT: 17 U/L (ref 17–63)
ANION GAP: 10 (ref 5–15)
AST: 22 U/L (ref 15–41)
Albumin: 3.6 g/dL (ref 3.5–5.0)
Alkaline Phosphatase: 70 U/L (ref 38–126)
BUN: 34 mg/dL — ABNORMAL HIGH (ref 6–20)
CALCIUM: 8.9 mg/dL (ref 8.9–10.3)
CHLORIDE: 99 mmol/L — AB (ref 101–111)
CO2: 32 mmol/L (ref 22–32)
Creatinine, Ser: 7.17 mg/dL — ABNORMAL HIGH (ref 0.61–1.24)
GFR, EST AFRICAN AMERICAN: 8 mL/min — AB (ref >60)
GFR, EST NON AFRICAN AMERICAN: 7 mL/min — AB (ref >60)
Glucose, Bld: 115 mg/dL — ABNORMAL HIGH (ref 65–99)
Potassium: 5.4 mmol/L — ABNORMAL HIGH (ref 3.5–5.1)
Sodium: 141 mmol/L (ref 135–145)
Total Bilirubin: 0.2 mg/dL — ABNORMAL LOW (ref 0.3–1.2)
Total Protein: 7.6 g/dL (ref 6.5–8.1)

## 2016-10-01 LAB — MAGNESIUM: MAGNESIUM: 3.6 mg/dL — AB (ref 1.7–2.4)

## 2016-10-01 NOTE — Assessment & Plan Note (Addendum)
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  Now on FOLFIRI + Erbitux  Oncology history updated.  Labs today: CBC diff, CMET, Magnesium.  I personally reviewed and went over laboratory results with the patient.  The results are noted within this dictation. Hyperkalemia and hypermagnesemia is noted.  Calcium is WNL.  He is asymptomatic.  Will defer management to his renal specialist.  Imaging in December demonstrated stable disease despite his rising CEA.  From a medical oncology standpoint, options moving forward are limited and response rates of these options are low.  Therefore, without gross progression of disease, we will hold off on change in treatment.  He had a renal US on 09/25/2016 at [facility] that demonstrated some soft tissue fullness of the renal pelvis suspicious for urothelial carcinoma.  CTs have been negative for this issue in the past and we will rely on CT results over [place].  When gross progression of disease is identified, then we will change therapy to [place].  Following this, Hospice would be next best option (at this time).  This was discussed with the patient and he seems to grasp this.  CT CAP w contrast is ordered for ~ 4 weeks.  Return next week for treatment and 3 weeks for treatment and follow-up.

## 2016-10-01 NOTE — Patient Instructions (Addendum)
[place] at [facility] Discharge Instructions  RECOMMENDATIONS MADE BY THE CONSULTANT AND ANY TEST RESULTS WILL BE SENT TO YOUR REFERRING PHYSICIAN.  Treatment next Thursday 1/11  CT chest/abd/pelvis with contrast in Feb 2018  Treatment 1/25 & follow up with MD  Thank you for choosing [place] at [facility] to provide your oncology and hematology care.  To afford each patient quality time with our provider, please arrive at least 15 minutes before your scheduled appointment time.    If you have a lab appointment with the [facility] please come in thru the  Main Entrance and check in at the main information desk  You need to re-schedule your appointment should you arrive 10 or more minutes late.  We strive to give you quality time with our providers, and arriving late affects you and other patients whose appointments are after yours.  Also, if you no show three or more times for appointments you may be dismissed from the clinic at the providers discretion.     Again, thank you for choosing [facility].  Our hope is that these requests will decrease the amount of time that you wait before being seen by our physicians.       _____________________________________________________________  Should you have questions after your visit to [facility], please contact our office at [phone] between the hours of 8:30 a.m. and 4:30 p.m.  Voicemails left after 4:30 p.m. will not be returned until the following business day.  For prescription refill requests, have your pharmacy contact our office.       Resources For Cancer Patients and their Caregivers ? American Cancer Society: Can assist with transportation, wigs, general needs, runs Look Good Feel Better.        639-298-4568 ? Cancer Care: Provides financial assistance, online support groups, medication/co-pay assistance.  1-800-813-HOPE 954-104-9379) ? [place] Assists [place] cancer patients and their families through emotional , educational and financial support.  253-709-7984 ? [name] DSS Where to apply for food stamps, Medicaid and utility assistance. (628)812-6339 ? [name]: Transportation to medical appointments. 929 004 5309 ? Social Security Administration: May apply for disability if have a Stage IV cancer. 340-054-2895 507-764-8581 ? [employer], Disability and Transit Services: Assists with nutrition, care and transit needs. [place] Support Programs: @10RELATIVEDAYS @ > Cancer Support Group  2nd Tuesday of the month 1pm-2pm, [name]  > Creative Journey  3rd Tuesday of the month 1130am-1pm, [name]  > Look Good Feel Better  1st Wednesday of the month 10am-12 noon, [name] (Call [place] to register [phone])

## 2016-10-01 NOTE — Progress Notes (Signed)
No PCP Per Patient No address on file  Adenocarcinoma of cecum ([place]) - Plan: labetalol (NORMODYNE) 200 MG tablet, doxycycline (DORYX) 100 MG EC tablet, CT Abdomen Pelvis W Contrast, CT Chest W Contrast, CEA  CURRENT THERAPY: FOLFIRI + Erbitux beginning on 04/29/2016  INTERVAL HISTORY: Nathaniel Johnston 61 y.o. 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 He had multiple other tubular adenomas and an occasional tubulovillous adenomas seen. Cancer is K-ras mutation negative (wild type).      07/19/2014 Procedure    Colonoscopy      07/19/2014 Pathology Results    Right colon mass- invasive adenocarcinoma      07/25/2014 Surgery    Right hemicolectomy performed by Dr. Ladona Horns      07/25/2014 Pathology Results    mucinous adenocarcinoma, 6 cm in maximum dimension, arising from the cecum. positive for 19/25 nodes, +LVI, focal neural invasion, poorly differentiated with numerous extramural nodules, greater than 10      08/10/2014 Pathology Results    KRAS/NRAS mutation in negative.  No KRAS or NRAS mutations were detected in exon 2, 3, and 4. MSI STABLE      08/29/2014 - 02/20/2015 Chemotherapy    FOLFOX adjusted for renal failure and on dialysis, 12 cycles      04/02/2015 Imaging    CT abdomen pelvis-interval resolution of previously described anterior abdominal wall abscess. Ventral abdominal wall wound is predominantly healed. Small fat containing ventral abdominal wall hernia. NED.      08/18/2015 Progression         08/18/2015 PET scan    Prominent recurrence with extensive retroperitoneal hypermetabolic adenopathy, extensive adenopathy and nodularity in the right perirenal space and tracking in the right peritoneum. Hypermetabolic mass along the bowel anastomotic site.       09/04/2015 - 04/15/2016 Chemotherapy    FOLFOX chemotherapy reinitiated for recurrent disease.      11/28/2015 Imaging    CT abdomen and pelvis-persistent nodularity along the right pararenal space, reduction in retroperitoneal lymphadenopathy, persistent enlarged retroperitoneal lymph node adjacent to the right kidney, no evidence of disease progression or liver metastases.      03/11/2016 Miscellaneous    Transfer of medical oncology care to [facility] (from [facility])      03/11/2016 Code Status    DNR      03/24/2016 Imaging    CT CAP- Mild increase in abdominal retroperitoneal lymphadenopathy since previous study. New mild right hydronephrosis also noted. No significant change in retroperitoneal soft tissue nodularity in right pararenal space, consistent with metastatic disease      04/29/2016 Treatment Plan Change    Change in therapy      04/29/2016 -  Chemotherapy    FOLFIRI + Erbitux      05/04/2016 - 05/07/2016 Hospital Admission    Admission diagnosis: Acute GI blood loss Additional comments: 2 units of PRBCs administered      05/13/2016 Code Status    FULL CODE      07/28/2016 Imaging    No acute process or evidence of metastatic disease in the chest. Similar size of a small posterior mediastinal node, not pathologic by size criteria.Status post right hemicolectomy. Mild soft tissue fullness in the region of the anastomosis for which locally  recurrent disease or inflammation versus ischemia are differential considerations. 2. Mild progression of retroperitoneal adenopathy. 3. Similar metastasis within the right para renal space and right posterior peritoneal surface.      08/30/2016 Imaging    No interval acute abnormality in the chest, abdomen or pelvis. 2. Stable mild right hydronephrosis due to obstructing mass centered at the right renal pelvis, which has mildly increased in size, most consistent with a retroperitoneal  metastasis. 3. Additional sites of metastatic disease in the abdomen are stable, including confluent extensive retroperitoneal nodal metastases and infiltrative right perinephric retroperitoneal metastases. 4. Stable masslike wall thickening at the ileocolic anastomosis, most consistent with local tumor recurrence. 5. Two subcentimeter hypodense liver lesions, for which 1 month stability has been demonstrated, liver metastases not excluded. 6. Previously demonstrated FDG avid posterior paraesophageal mediastinal lymph node is stable and top-normal size. Nodular 7 mm opacity in the basilar right lower lobe, for which 1 month stability has been demonstrated, lung metastasis not excluded. Otherwise no findings of metastatic disease in the chest.       He reports that he feels well.  He denies any complaints today.  He notes bilateral groin pain that has resolved with 1 day of treatment with Hydrocodone.  He denies any urinary complaints.  He denies any nausea or vomiting.  We had a good meaningful conversation about his future oncology care.  He has not demonstrated frank progression of disease on imaging at this time.  His CEA is climbing which is indicative of failure, but radiographic evidence is not available at this time.  He understands this.  We reviewed future treatment options if cancer does progress.  We would recommend [place] given his ongoing hemodialysis.  He is advised that options following [place] are best supportive care and Hospice.  He is appreciative of this information.  Review of Systems  Constitutional: Negative.  Negative for chills, fever and weight loss.  HENT: Negative.   Eyes: Negative.   Respiratory: Negative.   Cardiovascular: Positive for leg swelling (Stable, chronic).  Gastrointestinal: Negative for abdominal pain, blood in stool, constipation, diarrhea, melena, nausea and vomiting.  Genitourinary: Negative.  Negative for dysuria and hematuria.   Musculoskeletal: Negative.   Skin: Negative.  Negative for rash.  Neurological: Negative.   Endo/Heme/Allergies: Negative.   Psychiatric/Behavioral: Negative.     Past Medical History:  Diagnosis Date  . Adenocarcinoma of cecum ([place]) 03/08/2016  . Cancer ([place]) 07/2014   colon cancer surgery.  Finished Chemo 02/2015  . Chronic kidney disease   . Diabetes mellitus without complication ([place])   . Dialysis patient ([place]) 2010  . DNR (do not resuscitate) 03/11/2016  . ESRD (end stage renal disease) ([place]) 03/11/2016  . GERD (gastroesophageal reflux disease)   . High cholesterol   . Hypertension   . Stroke [facility])    2003    Past Surgical History:  Procedure Laterality Date  . AMPUTATION TOE Left 2001  . CATARACT EXTRACTION W/PHACO Right 06/16/2015   Procedure: CATARACT EXTRACTION PHACO AND INTRAOCULAR LENS PLACEMENT RIGHT EYE CDE=54.69;  Surgeon: Williams Che, MD;  Location: [facility] ORS;  Service: Ophthalmology;  Laterality: Right;  . COLECTOMY  07/2014  . DIALYSIS FISTULA CREATION Right   . ESOPHAGOGASTRODUODENOSCOPY N/A 05/06/2016   Procedure: ESOPHAGOGASTRODUODENOSCOPY (EGD);  Surgeon: Rogene Houston, MD;  Location: [facility] ENDO SUITE;  Service: Endoscopy;  Laterality: N/A;  . toe amputation Right 2000  . UPPER EXTREMITY ANGIOGRAM Right 05/03/2016   Procedure: RIGHT UPPER EXTREMITY ANGIOGRAM OF  ARTERIAL VENOUS FISTULA , BALLOON ANGIOPLASTY;  Surgeon: Vickie Epley, MD;  Location: [facility] ORS;  Service: Vascular;  Laterality: Right;  . YAG LASER APPLICATION Right 60/60/0459   Procedure: YAG LASER APPLICATION;  Surgeon: Williams Che, MD;  Location: [facility] ORS;  Service: Ophthalmology;  Laterality: Right;    No family history on file.  Social History   Social History  . Marital status: Divorced    Spouse name: N/A  . Number of children: N/A  . Years of education: N/A   Occupational History  . [employer]     [employer]   Social History Main Topics  . Smoking status: Former  Smoker    Packs/day: 0.50    Years: 15.00    Types: Cigarettes    Quit date: 03/12/1991  . Smokeless tobacco: Never Used  . Alcohol use No  . Drug use: No  . Sexual activity: Not on file   Other Topics Concern  . Not on file   Social History Narrative  . No narrative on file     PHYSICAL EXAMINATION  ECOG PERFORMANCE STATUS: 2 - Symptomatic, <50% confined to bed  Vitals:   10/01/16 1127  BP: 134/66  Pulse: (!) 104  Resp: 20  Temp: 97.9 F (36.6 C)     GENERAL:alert, no distress, well nourished, well developed, comfortable, cooperative, obese, smiling and in wheelchair, accompanied by his sister.  SKIN: No lesions. HEAD: Normocephalic, No masses, lesions, tenderness or abnormalities EYES: normal EARS: External ears normal OROPHARYNX:mucous membranes are moist  NECK: supple, trachea midline LYMPH:  no palpable lymphadenopathy BREAST:not examined LUNGS: clear to auscultation without wheezes, rales, rhonchi.  Decreased breath sounds bilaterally. HEART: regular rate & rhythm ABDOMEN:abdomen soft and normal bowel sounds BACK: Back symmetric, no curvature. EXTREMITIES:less then 2 second capillary refill, no joint deformities, effusion, or inflammation, no skin discoloration, no cyanosis  NEURO: alert & oriented x 3 with fluent speech, no focal motor/sensory deficits, brought to the clinic in a wheelchair.   LABORATORY DATA: CBC    Component Value Date/Time   WBC 5.7 10/01/2016 1108   RBC 3.37 (L) 10/01/2016 1108   HGB 10.4 (L) 10/01/2016 1108   HCT 33.2 (L) 10/01/2016 1108   PLT 372 10/01/2016 1108   MCV 98.5 10/01/2016 1108   MCH 30.9 10/01/2016 1108   MCHC 31.3 10/01/2016 1108   RDW 19.0 (H) 10/01/2016 1108   LYMPHSABS 1.1 10/01/2016 1108   MONOABS 0.7 10/01/2016 1108   EOSABS 0.2 10/01/2016 1108   BASOSABS 0.0 10/01/2016 1108      Chemistry      Component Value Date/Time   NA 141 10/01/2016 1108   K 5.4 (H) 10/01/2016 1108   CL 99 (L) 10/01/2016 1108    CO2 32 10/01/2016 1108   BUN 34 (H) 10/01/2016 1108   CREATININE 7.17 (H) 10/01/2016 1108      Component Value Date/Time   CALCIUM 8.9 10/01/2016 1108   ALKPHOS 70 10/01/2016 1108   AST 22 10/01/2016 1108   ALT 17 10/01/2016 1108   BILITOT 0.2 (L) 10/01/2016 1108      Lab Results  Component Value Date   CEA 320.1 (H) 09/09/2016     PENDING LABS:   RADIOGRAPHIC STUDIES:  Dg Abdomen Acute W/chest  Result Date: 09/28/2016 CLINICAL DATA:  Patient states he had an hour left of dialysis when he left with EMS due to abd pain/constipation. Constipation. Hx of cecum cancer 03/08/2016, chronic kidney disease, diabetes, ESRD, GERD, hypertension,  EGD, previous smoker. Due .*comment was truncated* EXAM: DG ABDOMEN ACUTE W/ 1V CHEST COMPARISON:  Chest CT 08/1929 17, Set abdominal films 09/27/2016 FINDINGS: LEFT-sided power port. RIGHT vascular subclavian stent. Normal cardiac silhouette. No pulmonary edema or pleural fluid. No dilated large or small bowel. Moderate volume stool in the ascending colon. Normal volume stool in the transverse descending and rectosigmoid colon. Surgical clips in the RIGHT lower quadrant. IMPRESSION: 1. No acute cardiopulmonary findings. 2. Moderate volume stool in the ascending colon. No evidence obstruction. Electronically Signed   By: Suzy Bouchard M.D.   On: 09/28/2016 10:16     PATHOLOGY:    ASSESSMENT AND PLAN:  Adenocarcinoma of cecum ([place]) Stage IV adenocarcinoma of colon on PET imaging on 08/13/2015 after being diagnosed with Stage IIIC ([id number]) on 07/25/2014 treated with definitive surgery followed by 6 months of adjuvant therapy consisting of FOLFOX.  Now on FOLFIRI + Erbitux  Oncology history updated.  Labs today: CBC diff, CMET, Magnesium.  I personally reviewed and went over laboratory results with the patient.  The results are noted within this dictation. Hyperkalemia and hypermagnesemia is noted.  Calcium is WNL.  He is asymptomatic.  Will  defer management to his renal specialist.  Imaging in December demonstrated stable disease despite his rising CEA.  From a medical oncology standpoint, options moving forward are limited and response rates of these options are low.  Therefore, without gross progression of disease, we will hold off on change in treatment.  He had a renal US on 09/25/2016 at [facility] that demonstrated some soft tissue fullness of the renal pelvis suspicious for urothelial carcinoma.  CTs have been negative for this issue in the past and we will rely on CT results over [place].  When gross progression of disease is identified, then we will change therapy to [place].  Following this, Hospice would be next best option (at this time).  This was discussed with the patient and he seems to grasp this.  CT CAP w contrast is ordered for ~ 4 weeks.  Return next week for treatment and 3 weeks for treatment and follow-up.   ORDERS PLACED FOR THIS ENCOUNTER: Orders Placed This Encounter  Procedures  . CT Abdomen Pelvis W Contrast  . CT Chest W Contrast  . CEA    MEDICATIONS PRESCRIBED THIS ENCOUNTER: Meds ordered this encounter  Medications  . labetalol (NORMODYNE) 200 MG tablet    Sig: Take 200 mg by mouth daily.  Marland Kitchen DISCONTD: oxyCODONE (OXY IR/ROXICODONE) 5 MG immediate release tablet    Sig: Take 5 mg by mouth every 4 (four) hours as needed.  . doxycycline (DORYX) 100 MG EC tablet    Sig: Take 100 mg by mouth 2 (two) times daily.    THERAPY PLAN:  Continue palliative treatment as outlined above.  All questions were answered. The patient knows to call the clinic with any problems, questions or concerns. We can certainly see the patient much sooner if necessary.  Patient and plan discussed with Dr. Ancil Linsey and she is in agreement with the aforementioned.   This note is electronically signed by: Doy Mince 10/01/2016 12:24 PM

## 2016-10-07 ENCOUNTER — Encounter (HOSPITAL_COMMUNITY): Payer: Self-pay

## 2016-10-07 ENCOUNTER — Encounter (HOSPITAL_BASED_OUTPATIENT_CLINIC_OR_DEPARTMENT_OTHER): Payer: Medicare Other

## 2016-10-07 VITALS — BP 150/76 | HR 97 | Temp 98.7°F | Resp 18 | Wt 232.0 lb

## 2016-10-07 DIAGNOSIS — Z5112 Encounter for antineoplastic immunotherapy: Secondary | ICD-10-CM

## 2016-10-07 DIAGNOSIS — C180 Malignant neoplasm of cecum: Secondary | ICD-10-CM

## 2016-10-07 DIAGNOSIS — C772 Secondary and unspecified malignant neoplasm of intra-abdominal lymph nodes: Secondary | ICD-10-CM

## 2016-10-07 LAB — CBC WITH DIFFERENTIAL/PLATELET
Basophils Absolute: 0 10*3/uL (ref 0.0–0.1)
Basophils Relative: 0 %
Eosinophils Absolute: 0.2 10*3/uL (ref 0.0–0.7)
Eosinophils Relative: 2 %
HEMATOCRIT: 31.9 % — AB (ref 39.0–52.0)
Hemoglobin: 10.3 g/dL — ABNORMAL LOW (ref 13.0–17.0)
LYMPHS ABS: 2.5 10*3/uL (ref 0.7–4.0)
LYMPHS PCT: 31 %
MCH: 30.3 pg (ref 26.0–34.0)
MCHC: 32.3 g/dL (ref 30.0–36.0)
MCV: 93.8 fL (ref 78.0–100.0)
Monocytes Absolute: 0.8 10*3/uL (ref 0.1–1.0)
Monocytes Relative: 10 %
NEUTROS PCT: 57 %
Neutro Abs: 4.6 10*3/uL (ref 1.7–7.7)
Platelets: 427 10*3/uL — ABNORMAL HIGH (ref 150–400)
RBC: 3.4 MIL/uL — AB (ref 4.22–5.81)
RDW: 18.1 % — ABNORMAL HIGH (ref 11.5–15.5)
WBC: 8.1 10*3/uL (ref 4.0–10.5)

## 2016-10-07 LAB — COMPREHENSIVE METABOLIC PANEL
ALK PHOS: 68 U/L (ref 38–126)
ALT: 17 U/L (ref 17–63)
AST: 20 U/L (ref 15–41)
Albumin: 3.4 g/dL — ABNORMAL LOW (ref 3.5–5.0)
Anion gap: 10 (ref 5–15)
BUN: 21 mg/dL — ABNORMAL HIGH (ref 6–20)
CALCIUM: 8.6 mg/dL — AB (ref 8.9–10.3)
CO2: 32 mmol/L (ref 22–32)
Chloride: 92 mmol/L — ABNORMAL LOW (ref 101–111)
Creatinine, Ser: 4.36 mg/dL — ABNORMAL HIGH (ref 0.61–1.24)
GFR calc non Af Amer: 13 mL/min — ABNORMAL LOW (ref >60)
GFR, EST AFRICAN AMERICAN: 15 mL/min — AB (ref >60)
Glucose, Bld: 44 mg/dL — CL (ref 65–99)
Potassium: 4.2 mmol/L (ref 3.5–5.1)
Sodium: 134 mmol/L — ABNORMAL LOW (ref 135–145)
Total Bilirubin: 0.3 mg/dL (ref 0.3–1.2)
Total Protein: 7.6 g/dL (ref 6.5–8.1)

## 2016-10-07 LAB — MAGNESIUM: MAGNESIUM: 2.6 mg/dL — AB (ref 1.7–2.4)

## 2016-10-07 MED ORDER — CLONIDINE HCL 0.2 MG PO TABS: 0.2000 mg | ORAL_TABLET | Freq: Once | ORAL | Status: DC

## 2016-10-07 MED ORDER — SODIUM CHLORIDE 0.9 % IV SOLN
1500.0000 mg/m2 | INTRAVENOUS | Status: DC
  Administered 2016-10-07: 3650 mg via INTRAVENOUS
  Filled 2016-10-07: qty 73

## 2016-10-07 MED ORDER — LEUCOVORIN CALCIUM INJECTION 350 MG
400.0000 mg/m2 | Freq: Once | INTRAMUSCULAR | Status: AC
  Administered 2016-10-07: 972 mg via INTRAVENOUS
  Filled 2016-10-07: qty 17.5

## 2016-10-07 MED ORDER — SODIUM CHLORIDE 0.9 % IV SOLN: 10.0000 mg | Freq: Once | INTRAVENOUS | Status: DC

## 2016-10-07 MED ORDER — IRINOTECAN HCL CHEMO INJECTION 100 MG/5ML
90.0000 mg/m2 | Freq: Once | INTRAVENOUS | Status: AC
  Administered 2016-10-07: 220 mg via INTRAVENOUS
  Filled 2016-10-07: qty 9

## 2016-10-07 MED ORDER — CETUXIMAB CHEMO IV INJECTION 200 MG/100ML
500.0000 mg/m2 | Freq: Once | INTRAVENOUS | Status: AC
  Administered 2016-10-07: 1200 mg via INTRAVENOUS
  Filled 2016-10-07: qty 100

## 2016-10-07 MED ORDER — FLUOROURACIL CHEMO INJECTION 2.5 GM/50ML
300.0000 mg/m2 | Freq: Once | INTRAVENOUS | Status: AC
  Administered 2016-10-07: 750 mg via INTRAVENOUS
  Filled 2016-10-07: qty 15

## 2016-10-07 MED ORDER — HEPARIN SOD (PORK) LOCK FLUSH 100 UNIT/ML IV SOLN
INTRAVENOUS | Status: AC
  Filled 2016-10-07: qty 5

## 2016-10-07 MED ORDER — CLONIDINE HCL 0.1 MG PO TABS
0.1000 mg | ORAL_TABLET | Freq: Once | ORAL | Status: AC
  Administered 2016-10-07: 0.1 mg via ORAL
  Filled 2016-10-07: qty 1

## 2016-10-07 MED ORDER — ATROPINE SULFATE 1 MG/ML IJ SOLN
0.5000 mg | Freq: Once | INTRAMUSCULAR | Status: AC | PRN
  Administered 2016-10-07: 0.5 mg via INTRAVENOUS
  Filled 2016-10-07: qty 1

## 2016-10-07 MED ORDER — DIPHENHYDRAMINE HCL 50 MG/ML IJ SOLN
50.0000 mg | Freq: Once | INTRAMUSCULAR | Status: AC
  Administered 2016-10-07: 50 mg via INTRAVENOUS
  Filled 2016-10-07: qty 1

## 2016-10-07 MED ORDER — SODIUM CHLORIDE 0.9% FLUSH: 10.0000 mL | INTRAVENOUS | Status: DC | PRN

## 2016-10-07 MED ORDER — DEXAMETHASONE SODIUM PHOSPHATE 10 MG/ML IJ SOLN
10.0000 mg | Freq: Once | INTRAMUSCULAR | Status: AC
  Administered 2016-10-07: 10 mg via INTRAVENOUS
  Filled 2016-10-07: qty 1

## 2016-10-07 MED ORDER — PALONOSETRON HCL INJECTION 0.25 MG/5ML
0.2500 mg | Freq: Once | INTRAVENOUS | Status: AC
  Administered 2016-10-07: 0.25 mg via INTRAVENOUS
  Filled 2016-10-07: qty 5

## 2016-10-07 MED ORDER — SODIUM CHLORIDE 0.9 % IV SOLN
Freq: Once | INTRAVENOUS | Status: AC
  Administered 2016-10-07: 13:00:00 via INTRAVENOUS

## 2016-10-07 NOTE — Progress Notes (Signed)
Blood glucose reported as 44 at beginning of visit, patient asymptomatic and eating fried chicken and brownies, MD aware, no further orders or instruction. VSS. All other labs within baseline and treatment parameters for the patient per MD.  Patient tolerated infusion well.  BP elevated at the end of the infusion, MD aware and patient given 0.1mg  clonidine PO.  BP recheck 150/76 and patient asymptomatic.  MD aware and patient cleared to be discharged. Patient stable and wheeled out in a wheelchair per patient preference upon discharge from the clinic.

## 2016-10-07 NOTE — Patient Instructions (Signed)
[facility] Discharge Instructions for Patients Receiving Chemotherapy   Beginning January 23rd 2017 lab work for the [facility] will be done in the  Main lab at [facility] on 1st floor. If you have a lab appointment with the [facility] please come in thru the  Main Entrance and check in at the main information desk   Today you received the following chemotherapy agents: Leucovorin, irinotecan, fluorouracil, and erbitux.     If you develop nausea and vomiting, or diarrhea that is not controlled by your medication, call the clinic.  The clinic phone number is [phone]. Office hours are Monday-Friday 8:30am-5:00pm.  BELOW ARE SYMPTOMS THAT SHOULD BE REPORTED IMMEDIATELY:  *FEVER GREATER THAN 101.0 F  *CHILLS WITH OR WITHOUT FEVER  NAUSEA AND VOMITING THAT IS NOT CONTROLLED WITH YOUR NAUSEA MEDICATION  *UNUSUAL SHORTNESS OF BREATH  *UNUSUAL BRUISING OR BLEEDING  TENDERNESS IN MOUTH AND THROAT WITH OR WITHOUT PRESENCE OF ULCERS  *URINARY PROBLEMS  *BOWEL PROBLEMS  UNUSUAL RASH Items with * indicate a potential emergency and should be followed up as soon as possible. If you have an emergency after office hours please contact your primary care physician or go to the nearest emergency department.  Please call the clinic during office hours if you have any questions or concerns.   You may also contact the Patient Navigator at [phone] should you have any questions or need assistance in obtaining follow up care.      Resources For Cancer Patients and their Caregivers ? American Cancer Society: Can assist with transportation, wigs, general needs, runs Look Good Feel Better.        (281) 080-1604 ? Cancer Care: Provides financial assistance, online support groups, medication/co-pay assistance.  1-800-813-HOPE 541-751-5455) ? [facility] Assists [place] cancer patients and their families through emotional , educational and  financial support.  480 115 6313 ? [name] DSS Where to apply for food stamps, Medicaid and utility assistance. 613-231-2443 ? [name]: Transportation to medical appointments. 440-811-4626 ? Social Security Administration: May apply for disability if have a Stage IV cancer. [phone] ? [employer], Disability and Transit Services: Assists with nutrition, care and transit needs. 641-282-6944

## 2016-10-08 ENCOUNTER — Encounter (HOSPITAL_BASED_OUTPATIENT_CLINIC_OR_DEPARTMENT_OTHER): Payer: Medicare Other

## 2016-10-08 VITALS — BP 149/66 | HR 99 | Temp 98.4°F | Resp 18

## 2016-10-08 DIAGNOSIS — C180 Malignant neoplasm of cecum: Secondary | ICD-10-CM

## 2016-10-08 DIAGNOSIS — Z452 Encounter for adjustment and management of vascular access device: Secondary | ICD-10-CM

## 2016-10-08 LAB — CEA: CEA: 567.1 ng/mL — AB (ref 0.0–4.7)

## 2016-10-08 MED ORDER — HEPARIN SOD (PORK) LOCK FLUSH 100 UNIT/ML IV SOLN
INTRAVENOUS | Status: AC
  Filled 2016-10-08: qty 5

## 2016-10-08 MED ORDER — SODIUM CHLORIDE 0.9% FLUSH
10.0000 mL | INTRAVENOUS | Status: DC | PRN
  Administered 2016-10-08: 10 mL
  Filled 2016-10-08: qty 10

## 2016-10-08 MED ORDER — HEPARIN SOD (PORK) LOCK FLUSH 100 UNIT/ML IV SOLN
500.0000 [IU] | Freq: Once | INTRAVENOUS | Status: AC | PRN
  Administered 2016-10-08: 500 [IU]

## 2016-10-08 NOTE — Patient Instructions (Signed)
[facility] Discharge Instructions  RECOMMENDATIONS MADE BY THE CONSULTANT AND ANY TEST RESULTS WILL BE SENT TO YOUR REFERRING PHYSICIAN.  5FU pump discontinued and port flushed per protocol today. Follow-up as scheduled. Call clinic for any questions or concerns  Thank you for choosing  Cancer Center at [facility] to provide your oncology and hematology care.  To afford each patient quality time with our provider, please arrive at least 15 minutes before your scheduled appointment time.    If you have a lab appointment with the Cancer Center please come in thru the  Main Entrance and check in at the main information desk  You need to re-schedule your appointment should you arrive 10 or more minutes late.  We strive to give you quality time with our providers, and arriving late affects you and other patients whose appointments are after yours.  Also, if you no show three or more times for appointments you may be dismissed from the clinic at the providers discretion.     Again, thank you for choosing [facility].  Our hope is that these requests will decrease the amount of time that you wait before being seen by our physicians.       _____________________________________________________________  Should you have questions after your visit to [facility], please contact our office at [phone] between the hours of 8:30 a.m. and 4:30 p.m.  Voicemails left after 4:30 p.m. will not be returned until the following business day.  For prescription refill requests, have your pharmacy contact our office.       Resources For Cancer Patients and their Caregivers ? American Cancer Society: Can assist with transportation, wigs, general needs, runs Look Good Feel Better.        1-888-227-6333 ? Cancer Care: Provides financial assistance, online support groups, medication/co-pay assistance.  1-800-813-HOPE  (4673) ? [facility] Assists [name] cancer patients and their families through emotional , educational and financial support.  336-427-4357 ? [name] DSS Where to apply for food stamps, Medicaid and utility assistance. 336-342-1394 ? [name]: Transportation to medical appointments. 336-347-2287 ? Social Security Administration: May apply for disability if have a Stage IV cancer. 336-342-7796 1-800-772-1213 ? [name] Aging, Disability and Transit Services: Assists with nutrition, care and transit needs. 336-349-2343  Cancer Center Support Programs: [email or web address]@ > Cancer Support Group  2nd Tuesday of the month 1pm-2pm, [name]  > Creative Journey  3rd Tuesday of the month 1130am-1pm, [name]  > Look Good Feel Better  1st Wednesday of the month 10am-12 noon, [name] (Call American Cancer Society to register 1-800-395-5775)   

## 2016-10-08 NOTE — Progress Notes (Signed)
Nathaniel Johnston tolerated 5FU pump well without complaints or incident. 5FU pump discontinued and port flushed with 10 ml NS and 5 ml Heparin easily per protocol. VSS Pt discharged via wheelchair in satisfactory condition accompanied by his friend

## 2016-10-21 ENCOUNTER — Encounter (HOSPITAL_BASED_OUTPATIENT_CLINIC_OR_DEPARTMENT_OTHER): Payer: Medicare Other | Admitting: Oncology

## 2016-10-21 ENCOUNTER — Encounter (HOSPITAL_COMMUNITY): Payer: Self-pay | Admitting: Lab

## 2016-10-21 ENCOUNTER — Encounter (HOSPITAL_COMMUNITY): Payer: Self-pay | Admitting: Oncology

## 2016-10-21 ENCOUNTER — Encounter (HOSPITAL_COMMUNITY): Payer: Medicare Other

## 2016-10-21 ENCOUNTER — Encounter (HOSPITAL_COMMUNITY): Payer: Self-pay | Admitting: Emergency Medicine

## 2016-10-21 ENCOUNTER — Emergency Department (HOSPITAL_COMMUNITY)
Admission: EM | Admit: 2016-10-21 | Discharge: 2016-10-21 | Disposition: A | Discharge status: Home / Self Care | Payer: Medicare Other | Attending: Emergency Medicine | Admitting: Emergency Medicine

## 2016-10-21 DIAGNOSIS — I120 Hypertensive chronic kidney disease with stage 5 chronic kidney disease or end stage renal disease: Secondary | ICD-10-CM | POA: Diagnosis not present

## 2016-10-21 DIAGNOSIS — N186 End stage renal disease: Secondary | ICD-10-CM | POA: Insufficient documentation

## 2016-10-21 DIAGNOSIS — C180 Malignant neoplasm of cecum: Secondary | ICD-10-CM

## 2016-10-21 DIAGNOSIS — Z87891 Personal history of nicotine dependence: Secondary | ICD-10-CM | POA: Diagnosis not present

## 2016-10-21 DIAGNOSIS — Z992 Dependence on renal dialysis: Secondary | ICD-10-CM | POA: Diagnosis not present

## 2016-10-21 DIAGNOSIS — R040 Epistaxis: Secondary | ICD-10-CM | POA: Diagnosis present

## 2016-10-21 DIAGNOSIS — Z79899 Other long term (current) drug therapy: Secondary | ICD-10-CM | POA: Insufficient documentation

## 2016-10-21 DIAGNOSIS — E1122 Type 2 diabetes mellitus with diabetic chronic kidney disease: Secondary | ICD-10-CM | POA: Diagnosis not present

## 2016-10-21 DIAGNOSIS — Z7984 Long term (current) use of oral hypoglycemic drugs: Secondary | ICD-10-CM | POA: Insufficient documentation

## 2016-10-21 LAB — COMPREHENSIVE METABOLIC PANEL
ALBUMIN: 3.1 g/dL — AB (ref 3.5–5.0)
ALK PHOS: 64 U/L (ref 38–126)
ALT: 15 U/L — ABNORMAL LOW (ref 17–63)
AST: 19 U/L (ref 15–41)
Anion gap: 9 (ref 5–15)
BILIRUBIN TOTAL: 0.3 mg/dL (ref 0.3–1.2)
BUN: 25 mg/dL — AB (ref 6–20)
CALCIUM: 8.3 mg/dL — AB (ref 8.9–10.3)
CO2: 32 mmol/L (ref 22–32)
CREATININE: 3.95 mg/dL — AB (ref 0.61–1.24)
Chloride: 94 mmol/L — ABNORMAL LOW (ref 101–111)
GFR calc Af Amer: 17 mL/min — ABNORMAL LOW (ref >60)
GFR, EST NON AFRICAN AMERICAN: 15 mL/min — AB (ref >60)
GLUCOSE: 107 mg/dL — AB (ref 65–99)
POTASSIUM: 3.3 mmol/L — AB (ref 3.5–5.1)
Sodium: 135 mmol/L (ref 135–145)
TOTAL PROTEIN: 6.8 g/dL (ref 6.5–8.1)

## 2016-10-21 LAB — CBC WITH DIFFERENTIAL/PLATELET
BASOS ABS: 0 10*3/uL (ref 0.0–0.1)
BASOS PCT: 1 %
Eosinophils Absolute: 0.2 10*3/uL (ref 0.0–0.7)
Eosinophils Relative: 5 %
HEMATOCRIT: 25.8 % — AB (ref 39.0–52.0)
HEMOGLOBIN: 8.7 g/dL — AB (ref 13.0–17.0)
LYMPHS PCT: 24 %
Lymphs Abs: 1 10*3/uL (ref 0.7–4.0)
MCH: 31 pg (ref 26.0–34.0)
MCHC: 33.7 g/dL (ref 30.0–36.0)
MCV: 91.8 fL (ref 78.0–100.0)
MONO ABS: 0.3 10*3/uL (ref 0.1–1.0)
MONOS PCT: 7 %
NEUTROS ABS: 2.8 10*3/uL (ref 1.7–7.7)
NEUTROS PCT: 63 %
Platelets: 329 10*3/uL (ref 150–400)
RBC: 2.81 MIL/uL — ABNORMAL LOW (ref 4.22–5.81)
RDW: 17.5 % — AB (ref 11.5–15.5)
WBC: 4.4 10*3/uL (ref 4.0–10.5)

## 2016-10-21 LAB — MAGNESIUM: MAGNESIUM: 1.9 mg/dL (ref 1.7–2.4)

## 2016-10-21 MED ORDER — OXYMETAZOLINE HCL 0.05 % NA SOLN
1.0000 | Freq: Once | NASAL | Status: AC
  Administered 2016-10-21: 1 via NASAL
  Filled 2016-10-21: qty 15

## 2016-10-21 MED ORDER — SODIUM CHLORIDE 0.9% FLUSH
20.0000 mL | INTRAVENOUS | Status: DC | PRN
  Administered 2016-10-21: 20 mL via INTRAVENOUS
  Filled 2016-10-21: qty 20

## 2016-10-21 MED ORDER — HEPARIN SOD (PORK) LOCK FLUSH 100 UNIT/ML IV SOLN
500.0000 [IU] | Freq: Once | INTRAVENOUS | Status: AC
  Administered 2016-10-21: 500 [IU]
  Filled 2016-10-21: qty 5

## 2016-10-21 NOTE — ED Provider Notes (Signed)
[place] DEPT Provider Note   CSN: [id number] Arrival date & time: 10/21/16  1354     History   Chief Complaint Chief Complaint  Patient presents with  . Epistaxis    HPI Nathaniel Johnston is a 62 y.o. male.   Patient states that he was having bleeding from his left nostril. He has put gauze in there and the bleeding stopped now   The history is provided by the patient.  Epistaxis   This is a recurrent problem. The current episode started 3 to 5 hours ago. The problem occurs constantly. The problem has been resolved. The problem is associated with anticoagulants. The bleeding has been from the left nare. Treatments tried: Packing and pressure. The treatment provided significant relief. His past medical history does not include sinus problems.    Past Medical History:  Diagnosis Date  . 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 Cancer ([place]) 07/2014   colon cancer surgery.  Finished Chemo 02/2015  . Chronic kidney disease   . Diabetes mellitus without complication ([place])   . Dialysis patient ([place]) 2010  . DNR (do not resuscitate) 03/11/2016  . ESRD (end stage renal disease) ([place]) 03/11/2016  . GERD (gastroesophageal reflux disease)   . High cholesterol   . Hypertension   . Stroke [facility])    2003    Patient Active Problem List   Diagnosis Date Noted  . Goals of care, counseling/discussion 09/25/2016  . 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 Acute blood loss anemia 05/05/2016  . GI bleed 05/04/2016  . Dialysis AV fistula malfunction ([place])   . Diarrhea 04/01/2016  . ESRD (end stage renal disease) ([place]) 03/11/2016  . 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 High cholesterol     Past Surgical History:  Procedure Laterality Date  . AMPUTATION TOE Left 2001  . CATARACT EXTRACTION W/PHACO Right 06/16/2015   Procedure: CATARACT EXTRACTION PHACO AND INTRAOCULAR LENS PLACEMENT RIGHT EYE CDE=54.69;  Surgeon: Williams Che, MD;   Location: [facility] ORS;  Service: Ophthalmology;  Laterality: Right;  . COLECTOMY  07/2014  . DIALYSIS FISTULA CREATION Right   . ESOPHAGOGASTRODUODENOSCOPY N/A 05/06/2016   Procedure: ESOPHAGOGASTRODUODENOSCOPY (EGD);  Surgeon: Rogene Houston, MD;  Location: [facility] ENDO SUITE;  Service: Endoscopy;  Laterality: N/A;  . toe amputation Right 2000  . UPPER EXTREMITY ANGIOGRAM Right 05/03/2016   Procedure: RIGHT UPPER EXTREMITY ANGIOGRAM OF ARTERIAL VENOUS FISTULA , BALLOON ANGIOPLASTY;  Surgeon: Vickie Epley, MD;  Location: [facility] ORS;  Service: Vascular;  Laterality: Right;  . YAG LASER APPLICATION Right [id number]   Procedure: YAG LASER APPLICATION;  Surgeon: Williams Che, MD;  Location: [facility] ORS;  Service: Ophthalmology;  Laterality: Right;       Home Medications    Prior to Admission medications   Medication Sig Start Date End Date Taking? Authorizing Provider  amLODipine (NORVASC) 10 MG tablet Take 10 mg by mouth daily.  07/29/14  Yes Historical Provider, MD  diphenoxylate-atropine (LOMOTIL) 2.5-0.025 MG tablet Take 1 tablet by mouth 4 (four) times daily as needed for diarrhea or loose stools. 08/26/16  Yes Manon Hilding Kefalas, PA-C  glipiZIDE (GLUCOTROL) 10 MG tablet Take 1 tablet by mouth 2 (two) times daily before a meal.  07/01/14  Yes Historical Provider, MD  lisinopril (PRINIVIL,ZESTRIL) 40 MG tablet Take 40 mg by mouth daily.  06/02/10  Yes Historical Provider, MD  loperamide (IMODIUM) 2 MG capsule Take 2 capsules after the first loose stool and  then 1 capsule every 2 hours until you go a total of 12 hours without having a loose stool. If it is bedtime and you are having loose stools take 2 capsules every 4 hours until morning. Call [place]. 08/26/16  Yes Manon Hilding Kefalas, PA-C  ofloxacin (OCUFLOX) 0.3 % ophthalmic solution Place 1 drop into both eyes 3 (three) times daily. 07/19/16  Yes Historical Provider, MD  simvastatin (ZOCOR) 80 MG tablet Take 1 tablet by mouth daily at 6 PM.  08/17/14   Yes Historical Provider, MD  Skin Protectants, Misc. (EUCERIN) cream Apply 1 application topically as needed for dry skin.  08/29/14  Yes Historical Provider, MD  triamcinolone cream (KENALOG) 0.1 % Apply 1 application topically daily as needed (for skin irritation).  06/05/14  Yes Historical Provider, MD  HYDROcodone-acetaminophen (NORCO/VICODIN) 5-325 MG tablet Take 1 tablet by mouth every 6 (six) hours as needed for moderate pain. Patient not taking: Reported on 10/21/2016 09/30/16   Baird Cancer, PA-C    Family History History reviewed. No pertinent family history.  Social History Social History  Substance Use Topics  . Smoking status: Former Smoker    Packs/day: 0.50    Years: 15.00    Types: Cigarettes    Quit date: 03/12/1991  . Smokeless tobacco: Never Used  . Alcohol use No     Allergies   Patient has no known allergies.   Review of Systems Review of Systems  Constitutional: Negative for appetite change and fatigue.  HENT: Positive for nosebleeds. Negative for congestion, ear discharge and sinus pressure.   Eyes: Negative for discharge.  Respiratory: Negative for cough.   Cardiovascular: Negative for chest pain.  Gastrointestinal: Negative for abdominal pain and diarrhea.  Genitourinary: Negative for frequency and hematuria.  Musculoskeletal: Negative for back pain.  Skin: Negative for rash.  Neurological: Negative for seizures and headaches.  Psychiatric/Behavioral: Negative for hallucinations.     Physical Exam Updated Vital Signs BP 115/65 (BP Location: Left Arm)   Pulse 91   Temp 97.6 F (36.4 C) (Oral)   Resp 18   Ht 6\' 1"  (1.854 m)   Wt 237 lb (107.5 kg)   SpO2 100%   BMI 31.27 kg/m   Physical Exam  Constitutional: He is oriented to person, place, and time. He appears well-developed.  HENT:  Head: Normocephalic.  Dry blood in left nostril.  Eyes: Conjunctivae are normal.  Neck: No tracheal deviation present.  Cardiovascular:  No murmur  heard. Musculoskeletal: Normal range of motion.  Neurological: He is oriented to person, place, and time.  Skin: Skin is warm.  Psychiatric: He has a normal mood and affect.     ED Treatments / Results  Labs (all labs ordered are listed, but only abnormal results are displayed) Labs Reviewed - No data to display  EKG  EKG Interpretation None       Radiology No results found.  Procedures Procedures (including critical care time)  Medications Ordered in ED Medications  heparin lock flush 100 unit/mL (not administered)  oxymetazoline (AFRIN) 0.05 % nasal spray 1 spray (not administered)     Initial Impression / Assessment and Plan / ED Course  I have reviewed the triage vital signs and the nursing notes.  Pertinent labs & imaging results that were available during my care of the patient were reviewed by me and considered in my medical decision making (see chart for details).     Patient with nosebleed that has resolved. He is sent home  with some Afrin and will follow-up as needed  Final Clinical Impressions(s) / ED Diagnoses   Final diagnoses:  Epistaxis    New Prescriptions New Prescriptions   No medications on file     Milton Ferguson, MD 10/21/16 1821

## 2016-10-21 NOTE — Progress Notes (Signed)
No PCP Per Patient No address on file  Adenocarcinoma of cecum ([place])  CURRENT THERAPY: FOLFIRI + Erbitux beginning on 04/29/2016  INTERVAL HISTORY: Nathaniel Johnston 61 y.o. 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    Adenocarcinoma of cecum ([place])   06/19/2014 Pathology Results    Biopsy of right colon mass positive for adenocarcinoma. He had multiple other tubular adenomas and an occasional tubulovillous adenomas seen. Cancer is K-ras mutation negative (wild type).      07/19/2014 Procedure    Colonoscopy      07/19/2014 Pathology Results    Right colon mass- invasive adenocarcinoma      07/25/2014 Surgery    Right hemicolectomy performed by Dr. Ladona Horns      07/25/2014 Pathology Results    mucinous adenocarcinoma, 6 cm in maximum dimension, arising from the cecum. positive for 19/25 nodes, +LVI, focal neural invasion, poorly differentiated with numerous extramural nodules, greater than 10      08/10/2014 Pathology Results    KRAS/NRAS mutation in negative.  No KRAS or NRAS mutations were detected in exon 2, 3, and 4. MSI STABLE      08/29/2014 - 02/20/2015 Chemotherapy    FOLFOX adjusted for renal failure and on dialysis, 12 cycles      04/02/2015 Imaging    CT abdomen pelvis-interval resolution of previously described anterior abdominal wall abscess. Ventral abdominal wall wound is predominantly healed. Small fat containing ventral abdominal wall hernia. NED.      08/18/2015 Progression         08/18/2015 PET scan    Prominent recurrence with extensive retroperitoneal hypermetabolic adenopathy, extensive adenopathy and nodularity in the right perirenal space and tracking in the right peritoneum. Hypermetabolic mass along the bowel anastomotic site.       09/04/2015 - 04/15/2016 Chemotherapy    FOLFOX chemotherapy reinitiated for recurrent disease.      11/28/2015 Imaging    CT abdomen and  pelvis-persistent nodularity along the right pararenal space, reduction in retroperitoneal lymphadenopathy, persistent enlarged retroperitoneal lymph node adjacent to the right kidney, no evidence of disease progression or liver metastases.      03/11/2016 Miscellaneous    Transfer of medical oncology care to [facility] (from [facility])      03/11/2016 Code Status    DNR      03/24/2016 Imaging    CT CAP- Mild increase in abdominal retroperitoneal lymphadenopathy since previous study. New mild right hydronephrosis also noted. No significant change in retroperitoneal soft tissue nodularity in right pararenal space, consistent with metastatic disease      04/29/2016 Treatment Plan Change    Change in therapy      04/29/2016 -  Chemotherapy    FOLFIRI + Erbitux      05/04/2016 - 05/07/2016 Hospital Admission    Admission diagnosis: Acute GI blood loss Additional comments: 2 units of PRBCs administered      05/13/2016 Code Status    FULL CODE      07/28/2016 Imaging    No acute process or evidence of metastatic disease in the chest. Similar size of a small posterior mediastinal node, not pathologic by size criteria.Status post right hemicolectomy. Mild soft tissue fullness in the region of the anastomosis for which locally recurrent disease or inflammation versus ischemia are differential considerations. 2. Mild progression of retroperitoneal adenopathy. 3. Similar metastasis within the right para  renal space and right posterior peritoneal surface.      08/30/2016 Imaging    No interval acute abnormality in the chest, abdomen or pelvis. 2. Stable mild right hydronephrosis due to obstructing mass centered at the right renal pelvis, which has mildly increased in size, most consistent with a retroperitoneal metastasis. 3. Additional sites of metastatic disease in the abdomen are stable, including confluent extensive retroperitoneal nodal metastases  and infiltrative right perinephric retroperitoneal metastases. 4. Stable masslike wall thickening at the ileocolic anastomosis, most consistent with local tumor recurrence. 5. Two subcentimeter hypodense liver lesions, for which 1 month stability has been demonstrated, liver metastases not excluded. 6. Previously demonstrated FDG avid posterior paraesophageal mediastinal lymph node is stable and top-normal size. Nodular 7 mm opacity in the basilar right lower lobe, for which 1 month stability has been demonstrated, lung metastasis not excluded. Otherwise no findings of metastatic disease in the chest.       He reports that he feels well.  He denies any nausea, vomiting.  He denies any issues with his bowels.  He reports a left nose bleed since hemodialysis today.  He came to the clinic with compression bandage in place.  Nursing re-dressed on multiple occassions.  Ongoing bleeding is noted during exam.    I reviewed his CEA levels and the lack of future treatment options.   Review of Systems  Constitutional: Negative.  Negative for chills, fever and weight loss.  HENT: Positive for nosebleeds (left).   Eyes: Negative.   Respiratory: Negative.  Negative for cough.   Cardiovascular: Negative.  Negative for chest pain.  Gastrointestinal: Negative for abdominal pain, blood in stool, constipation, diarrhea, melena, nausea and vomiting.  Genitourinary: Negative.  Negative for dysuria and hematuria.  Musculoskeletal: Negative.   Skin: Negative.  Negative for rash.  Neurological: Negative.   Endo/Heme/Allergies: Negative.   Psychiatric/Behavioral: Negative.     Past Medical History:  Diagnosis Date  . 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 Cancer ([place]) 07/2014   colon cancer surgery.  Finished Chemo 02/2015  . Chronic kidney disease   . Diabetes mellitus without complication ([place])   . Dialysis patient ([place]) 2010  . DNR (do not resuscitate) 03/11/2016  . ESRD (end stage renal  disease) ([place]) 03/11/2016  . GERD (gastroesophageal reflux disease)   . High cholesterol   . Hypertension   . Stroke [facility])    2003    Past Surgical History:  Procedure Laterality Date  . AMPUTATION TOE Left 2001  . CATARACT EXTRACTION W/PHACO Right 06/16/2015   Procedure: CATARACT EXTRACTION PHACO AND INTRAOCULAR LENS PLACEMENT RIGHT EYE CDE=54.69;  Surgeon: Williams Che, MD;  Location: [facility] ORS;  Service: Ophthalmology;  Laterality: Right;  . COLECTOMY  07/2014  . DIALYSIS FISTULA CREATION Right   . ESOPHAGOGASTRODUODENOSCOPY N/A 05/06/2016   Procedure: ESOPHAGOGASTRODUODENOSCOPY (EGD);  Surgeon: Rogene Houston, MD;  Location: [facility] ENDO SUITE;  Service: Endoscopy;  Laterality: N/A;  . toe amputation Right 2000  . UPPER EXTREMITY ANGIOGRAM Right 05/03/2016   Procedure: RIGHT UPPER EXTREMITY ANGIOGRAM OF ARTERIAL VENOUS FISTULA , BALLOON ANGIOPLASTY;  Surgeon: Vickie Epley, MD;  Location: [facility] ORS;  Service: Vascular;  Laterality: Right;  . YAG LASER APPLICATION Right 37/34/2876   Procedure: YAG LASER APPLICATION;  Surgeon: Williams Che, MD;  Location: [facility] ORS;  Service: Ophthalmology;  Laterality: Right;    History reviewed. No pertinent family history.  Social History   Social History  . Marital status: Divorced  Spouse name: N/A  . Number of children: N/A  . Years of education: N/A   Occupational History  . [employer]     [employer]   Social History Main Topics  . Smoking status: Former Smoker    Packs/day: 0.50    Years: 15.00    Types: Cigarettes    Quit date: 03/12/1991  . Smokeless tobacco: Never Used  . Alcohol use No  . Drug use: No  . Sexual activity: Not Asked   Other Topics Concern  . None   Social History Narrative  . None     PHYSICAL EXAMINATION  ECOG PERFORMANCE STATUS: 2 - Symptomatic, <50% confined to bed  There were no vitals filed for this visit.  Vitals - 1 value per visit 0/93/2671  SYSTOLIC 245  DIASTOLIC 70  Pulse  87  Temperature 98.1  Respirations 18  Weight (lb) 237    GENERAL:alert, no distress, well nourished, well developed, comfortable, cooperative, obese, smiling and in chemo-bed sitting on the side with trash basket nearby with multiple gauzes with blood, unaccompanied.  SKIN: No lesions. HEAD: Normocephalic, No masses, lesions, tenderness or abnormalities.  Left compression bandage in place on nose, blood soaked. EYES: normal EARS: External ears normal OROPHARYNX:mucous membranes are moist, blood in mouth NECK: supple, trachea midline LYMPH:  no palpable lymphadenopathy BREAST:not examined LUNGS: clear to auscultation without wheezes, rales, rhonchi.  Decreased breath sounds bilaterally. HEART: regular rate & rhythm ABDOMEN:abdomen soft and normal bowel sounds BACK: Back symmetric, no curvature. EXTREMITIES:less then 2 second capillary refill, no joint deformities, effusion, or inflammation, no skin discoloration, no cyanosis  NEURO: alert & oriented x 3 with fluent speech, no focal motor/sensory deficits, brought to the clinic in a wheelchair.   LABORATORY DATA: CBC    Component Value Date/Time   WBC 4.4 10/21/2016 1130   RBC 2.81 (L) 10/21/2016 1130   HGB 8.7 (L) 10/21/2016 1130   HCT 25.8 (L) 10/21/2016 1130   PLT 329 10/21/2016 1130   MCV 91.8 10/21/2016 1130   MCH 31.0 10/21/2016 1130   MCHC 33.7 10/21/2016 1130   RDW 17.5 (H) 10/21/2016 1130   LYMPHSABS 1.0 10/21/2016 1130   MONOABS 0.3 10/21/2016 1130   EOSABS 0.2 10/21/2016 1130   BASOSABS 0.0 10/21/2016 1130      Chemistry      Component Value Date/Time   NA 135 10/21/2016 1130   K 3.3 (L) 10/21/2016 1130   CL 94 (L) 10/21/2016 1130   CO2 32 10/21/2016 1130   BUN 25 (H) 10/21/2016 1130   CREATININE 3.95 (H) 10/21/2016 1130      Component Value Date/Time   CALCIUM 8.3 (L) 10/21/2016 1130   ALKPHOS 64 10/21/2016 1130   AST 19 10/21/2016 1130   ALT 15 (L) 10/21/2016 1130   BILITOT 0.3 10/21/2016 1130       Lab Results  Component Value Date   CEA 567.1 (H) 10/07/2016     PENDING LABS:   RADIOGRAPHIC STUDIES:  Dg Abdomen Acute W/chest  Result Date: 09/28/2016 CLINICAL DATA:  Patient states he had an hour left of dialysis when he left with EMS due to abd pain/constipation. Constipation. Hx of cecum cancer 03/08/2016, chronic kidney disease, diabetes, ESRD, GERD, hypertension, EGD, previous smoker. Due .*comment was truncated* EXAM: DG ABDOMEN ACUTE W/ 1V CHEST COMPARISON:  Chest CT 08/1929 17, Set abdominal films 09/27/2016 FINDINGS: LEFT-sided power port. RIGHT vascular subclavian stent. Normal cardiac silhouette. No pulmonary edema or pleural fluid. No dilated large  or small bowel. Moderate volume stool in the ascending colon. Normal volume stool in the transverse descending and rectosigmoid colon. Surgical clips in the RIGHT lower quadrant. IMPRESSION: 1. No acute cardiopulmonary findings. 2. Moderate volume stool in the ascending colon. No evidence obstruction. Electronically Signed   By: Suzy Bouchard M.D.   On: 09/28/2016 10:16     PATHOLOGY:    ASSESSMENT AND PLAN:  Adenocarcinoma of cecum ([place]) Stage IV adenocarcinoma of colon on PET imaging on 08/13/2015 after being diagnosed with Stage IIIC (T3N2BM0) on 07/25/2014 treated with definitive surgery followed by 6 months of adjuvant therapy consisting of FOLFOX.  Now on FOLFIRI + Erbitux  Oncology history updated.  Labs today: CBC diff, CMET, Magnesium.  I personally reviewed and went over laboratory results with the patient.  Labs today satisfy treatment parameters today.  Treatment discontinued today.  He reports a nose bleed since hemodialysis this AM.  He has been her for 3 hours in the clinic with ongoing left epistaxis.  Nursing has packed, but continued bleeding.  Discussed with ED and will escort patient to ED for evaluation and management of left epistaxis.  Cancer marker, CEA, continues to climb rapidly.  He is not a  good candidate for treatment moving forward.    I discussed Hospice moving forward.  We had a long conversation regarding Hospice.  Patient education was given regarding Hospice and the services they provide.  The patient understands that studies report that early enrollment in Hospice actually allows the patient to live longer compared to those who enroll in Hospice nearer to end of life.  Hospice will allow the patient to stay at home at end of life or go to a facility for end of life care.  At this point, the patient would like to remain at home.  Hospice provides the patient with a team of providers to help with care including physicians, nurses, aids, chaplains, and social workers.  Hospice's goal is to keep patients out of the hospital and and comfortable by controlling symptoms with medications.  The patient is certainly Hospice appropriate with a life expectancy of less than 6 months.    Will cancel imaging and lab appointments.  Treatment plan is discontinued.  Episodes of care are resolved.  Will refer to [facility].  Return PRN.    ORDERS PLACED FOR THIS ENCOUNTER: No orders of the defined types were placed in this encounter.   MEDICATIONS PRESCRIBED THIS ENCOUNTER: No orders of the defined types were placed in this encounter.   THERAPY PLAN:  Discontinue treatment today.  Will refer to Hospice.  All questions were answered. The patient knows to call the clinic with any problems, questions or concerns. We can certainly see the patient much sooner if necessary.  Patient and plan discussed with Dr. Ancil Linsey and she is in agreement with the aforementioned.   This note is electronically signed by: Doy Mince 10/21/2016 1:18 PM

## 2016-10-21 NOTE — Assessment & Plan Note (Addendum)
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  Now on FOLFIRI + Erbitux  Oncology history updated.  Labs today: CBC diff, CMET, Magnesium.  I personally reviewed and went over laboratory results with the patient.  Labs today satisfy treatment parameters today.  Treatment discontinued today.  He reports a nose bleed since hemodialysis this AM.  He has been her for 3 hours in the clinic with ongoing left epistaxis.  Nursing has packed, but continued bleeding.  Discussed with ED and will escort patient to ED for evaluation and management of left epistaxis.  Cancer marker, CEA, continues to climb rapidly.  He is not a good candidate for treatment moving forward.    I discussed Hospice moving forward.  We had a long conversation regarding Hospice.  Patient education was given regarding Hospice and the services they provide.  The patient understands that studies report that early enrollment in Hospice actually allows the patient to live longer compared to those who enroll in Hospice nearer to end of life.  Hospice will allow the patient to stay at home at end of life or go to a facility for end of life care.  At this point, the patient would like to remain at home.  Hospice provides the patient with a team of providers to help with care including physicians, nurses, aids, chaplains, and social workers.  Hospice's goal is to keep patients out of the hospital and and comfortable by controlling symptoms with medications.  The patient is certainly Hospice appropriate with a life expectancy of less than 6 months.    Will cancel imaging and lab appointments.  Treatment plan is discontinued.  Episodes of care are resolved.  Will refer to [facility].  Return PRN.

## 2016-10-21 NOTE — Progress Notes (Unsigned)
Referral sent to Hospice. Records faxed on 1/25

## 2016-10-21 NOTE — Discharge Instructions (Signed)
Use the nose spray and hold your nose closed if bleeding returns. Come back to the hospital if the bleeding does not stop

## 2016-10-21 NOTE — Patient Instructions (Addendum)
[place] at [facility] Discharge Instructions  RECOMMENDATIONS MADE BY THE CONSULTANT AND ANY TEST RESULTS WILL BE SENT TO YOUR REFERRING PHYSICIAN.  You were seen today by Kirby Crigler PA-C. Referral being sent to Hospice.    Thank you for choosing [place] at [facility] to provide your oncology and hematology care.  To afford each patient quality time with our provider, please arrive at least 15 minutes before your scheduled appointment time.    If you have a lab appointment with the [facility] please come in thru the  Main Entrance and check in at the main information desk  You need to re-schedule your appointment should you arrive 10 or more minutes late.  We strive to give you quality time with our providers, and arriving late affects you and other patients whose appointments are after yours.  Also, if you no show three or more times for appointments you may be dismissed from the clinic at the providers discretion.     Again, thank you for choosing [facility].  Our hope is that these requests will decrease the amount of time that you wait before being seen by our physicians.       _____________________________________________________________  Should you have questions after your visit to [facility], please contact our office at [phone] between the hours of 8:30 a.m. and 4:30 p.m.  Voicemails left after 4:30 p.m. will not be returned until the following business day.  For prescription refill requests, have your pharmacy contact our office.       Resources For Cancer Patients and their Caregivers ? American Cancer Society: Can assist with transportation, wigs, general needs, runs Look Good Feel Better.        (303)158-3382 ? Cancer Care: Provides financial assistance, online support groups, medication/co-pay assistance.  1-800-813-HOPE 661-680-0756) ? [place] Assists  [place] cancer patients and their families through emotional , educational and financial support.  475-011-6895 ? [name] DSS Where to apply for food stamps, Medicaid and utility assistance. (650) 261-8738 ? [name]: Transportation to medical appointments. 718-488-3682 ? Social Security Administration: May apply for disability if have a Stage IV cancer. (475)167-0545 641-504-2648 ? [employer], Disability and Transit Services: Assists with nutrition, care and transit needs. [place] Support Programs: @10RELATIVEDAYS @ > Cancer Support Group  2nd Tuesday of the month 1pm-2pm, [name]  > Creative Journey  3rd Tuesday of the month 1130am-1pm, [name]  > Look Good Feel Better  1st Wednesday of the month 10am-12 noon, [name] (Call [place] to register [phone])

## 2016-10-21 NOTE — Patient Instructions (Signed)
[place] at [facility] Discharge Instructions  RECOMMENDATIONS MADE BY THE CONSULTANT AND ANY TEST RESULTS WILL BE SENT TO YOUR REFERRING PHYSICIAN.  Port lab draw today. Chemotherapy held today. Follow-up as scheduled. Transported to ER for persistent nosebleed. Call clini for any questions or concerns  Thank you for choosing [place] at [facility] to provide your oncology and hematology care.  To afford each patient quality time with our provider, please arrive at least 15 minutes before your scheduled appointment time.    If you have a lab appointment with the [facility] please come in thru the  Main Entrance and check in at the main information desk  You need to re-schedule your appointment should you arrive 10 or more minutes late.  We strive to give you quality time with our providers, and arriving late affects you and other patients whose appointments are after yours.  Also, if you no show three or more times for appointments you may be dismissed from the clinic at the providers discretion.     Again, thank you for choosing [facility].  Our hope is that these requests will decrease the amount of time that you wait before being seen by our physicians.       _____________________________________________________________  Should you have questions after your visit to [facility], please contact our office at [phone] between the hours of 8:30 a.m. and 4:30 p.m.  Voicemails left after 4:30 p.m. will not be returned until the following business day.  For prescription refill requests, have your pharmacy contact our office.       Resources For Cancer Patients and their Caregivers ? American Cancer Society: Can assist with transportation, wigs, general needs, runs Look Good Feel Better.        323-453-4952 ? Cancer Care: Provides financial assistance, online support groups, medication/co-pay assistance.   1-800-813-HOPE 7010763751) ? [place] Assists [place] cancer patients and their families through emotional , educational and financial support.  4783641966 ? [name] DSS Where to apply for food stamps, Medicaid and utility assistance. (774)299-5217 ? [name]: Transportation to medical appointments. (934)336-5040 ? Social Security Administration: May apply for disability if have a Stage IV cancer. 825-385-6211 479 869 6990 ? [employer], Disability and Transit Services: Assists with nutrition, care and transit needs. [place] Support Programs: @10RELATIVEDAYS @ > Cancer Support Group  2nd Tuesday of the month 1pm-2pm, [name]  > Creative Journey  3rd Tuesday of the month 1130am-1pm, [name]  > Look Good Feel Better  1st Wednesday of the month 10am-12 noon, [name] (Call [place] to register [phone])

## 2016-10-21 NOTE — Progress Notes (Signed)
Osvaldo Angst presented today at clinic for chemo tx with a nose bleed that started this morning during dialysis.The left nostril is packed with gauze which has bloody drainage noted.Port accessed with 20 gauge needle with blood drawn for labs ordered today and flushed per protocol. Gauze changed 3 times with large blood clots noted while pt here in clinic. Kirby Crigler PA informed of this as well as pt's lab results. PA assessed pt and chemotherapy held with pt transported to ER for persistent nose bleed per PA orders. Portacath  saline locked,flushed per protocol and left accessed for use in ER. Charge nurse in ER made aware of this information.Pt transported to ER via wheelchair in stable condition

## 2016-10-21 NOTE — ED Triage Notes (Addendum)
Pt had chemo and dialysis today, had nosebleed from left nostril starting 3 hours ago, not on blood thinners. Pt alert and oriented.  Pt has guaze put into nostril which is taped to nose.  Pt instructed to pinch bridge of nose to control bleeding.

## 2016-10-22 ENCOUNTER — Telehealth (HOSPITAL_COMMUNITY): Payer: Self-pay | Admitting: Emergency Medicine

## 2016-10-22 ENCOUNTER — Encounter (HOSPITAL_COMMUNITY): Payer: Medicare Other

## 2016-10-22 LAB — CEA: CEA: 578.7 ng/mL — AB (ref 0.0–4.7)

## 2016-10-22 NOTE — Telephone Encounter (Signed)
Nathaniel Johnston's brother called and he wanted to know what the plan is.  [name] went on hospice yesterday.  They should call him today to set everything up.  I explained that his cancer marker had just kept going up and it wouldn't do him any good to be treated with the same treatment.  There were two pills available but with him being on dialysis it would just be pulled out of his system when he had dialysis.  He verbalized understanding.

## 2016-11-01 ENCOUNTER — Ambulatory Visit (HOSPITAL_COMMUNITY): Payer: Medicare Other

## 2016-11-04 ENCOUNTER — Ambulatory Visit (HOSPITAL_COMMUNITY): Payer: Medicare Other | Admitting: Hematology & Oncology

## 2016-11-04 ENCOUNTER — Ambulatory Visit (HOSPITAL_COMMUNITY): Payer: Medicare Other

## 2016-11-04 ENCOUNTER — Telehealth (HOSPITAL_COMMUNITY): Payer: Self-pay | Admitting: Adult Health

## 2016-11-04 NOTE — Telephone Encounter (Signed)
I received a call from Dr. Gaspar Cola, ED physician at [facility] with concerns re: Nathaniel Johnston.   Nathaniel Johnston presented to the ED after his hemodialysis treatment today with hematuria.  He is reportedly hemodynamically stable. Dr. Glennon Mac shared that he has been waiting for about 4 hours for urine specimen from patient, which is has been unable to produce.  Patient is reportedly hemodynamically stable at present.  CT scan was performed which showed significant progression of his disease and diffuse metastatic disease.  Dr. Glennon Mac shared with me that the patient had relayed to him that his oncology team had recommended Hospice and he has no further treatment options.   I verified with Dr. Glennon Mac that, indeed, Nathaniel Johnston does have extensive disease and unfortunately there are no additional treatment options for him.  Hospice was discussed with the patient at his last visit here at the cancer center on 10/21/16 and referral to [facility] was placed at that time.  Reinforced that from on oncology standpoint, the focus should be on the patient's symptomatic control and comfort care.  Of course, the patient is welcome to call us with questions, but [name] should really rely on his Hospice team to manage his symptoms.   Dr. Glennon Mac voiced understanding and appreciation for confirmation of the plan of care for this patient.  Encouraged him to call with further questions.    Mike Craze, NP [place] [phone]

## 2016-11-05 ENCOUNTER — Encounter (HOSPITAL_COMMUNITY): Payer: Medicare Other

## 2016-12-26 DEATH — deceased

## 2017-08-10 IMAGING — CT CT ABD-PELV W/ CM
3 of 6 series · 7 of 46 positions shown, 13 images · IV contrast (iopamidol)
Comparison: AP CT on 11/28/2015 and PET-CT on 08/13/2015

CLINICAL DATA: Followup metastatic adenocarcinoma of cecum.
Previous surgery and chemotherapy.

EXAM:
CT CHEST, ABDOMEN, AND PELVIS WITH CONTRAST
TECHNIQUE: Multidetector CT imaging of the chest, abdomen and pelvis was
performed following the standard protocol during bolus
administration of intravenous contrast.
CONTRAST:  100mL 27RXCA-O88 IOPAMIDOL (27RXCA-O88) INJECTION 61%

[Series 4: kidney delays · axial · 0.84mm/px · z∈[-628,-498]mm · 3 of 53 slices shown, 7 images]
[im 14/53  soft-tissue]
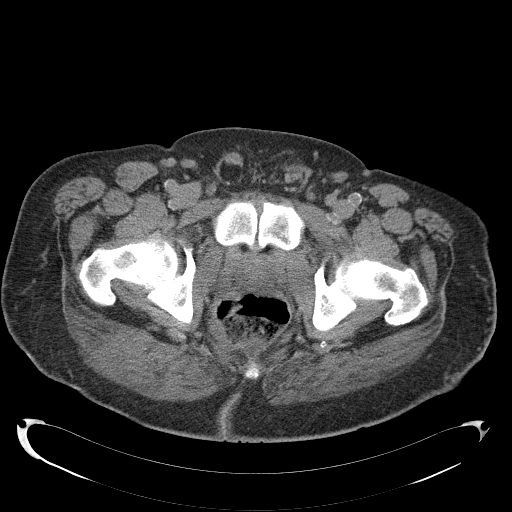
[im 14/53  lung]
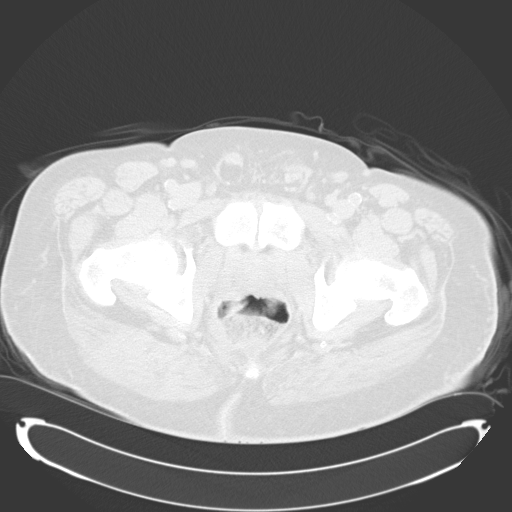
[im 14/53  bone]
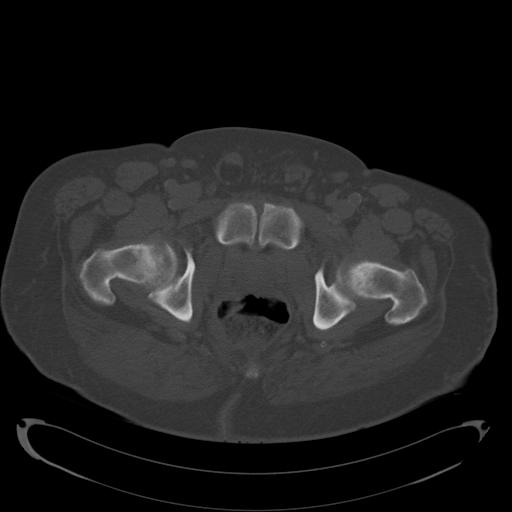
[im 27/53  soft-tissue]
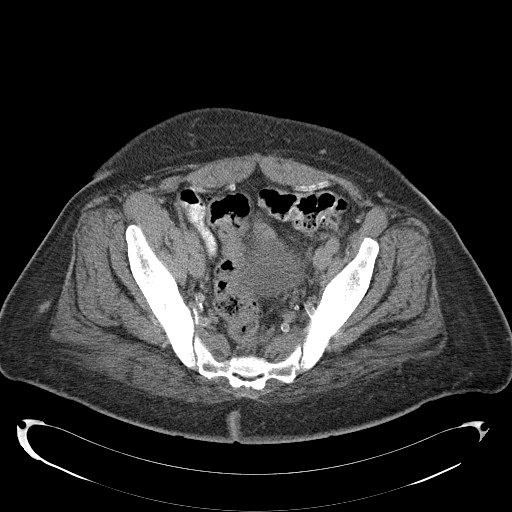
[im 27/53  lung]
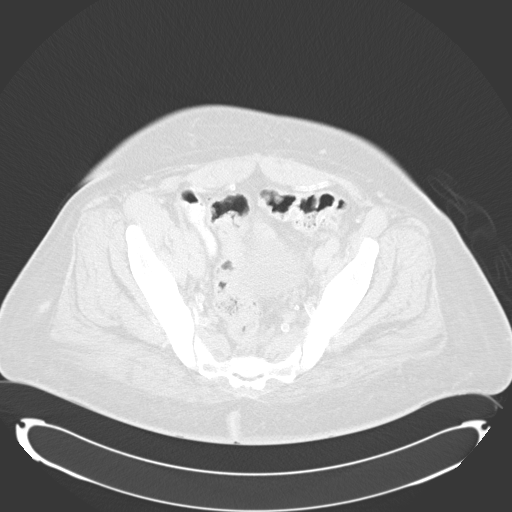
[im 40/53  soft-tissue]
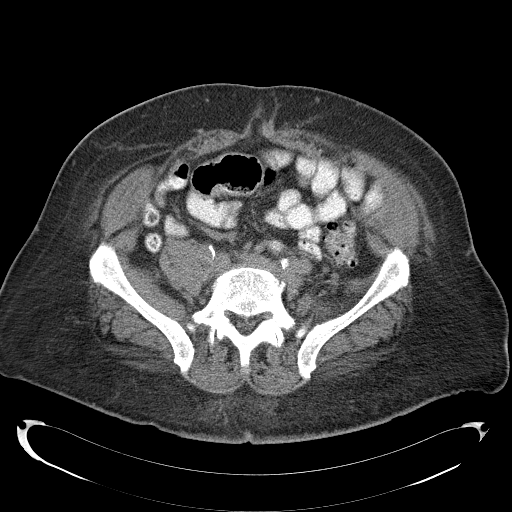
[im 40/53  lung]
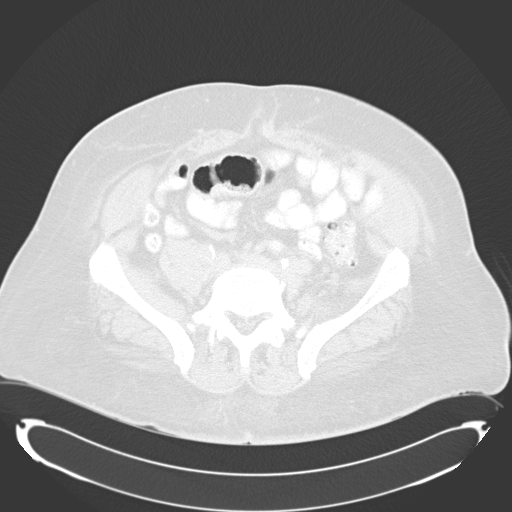

[Series 5: coronal cor · coronal · 0.93mm/px · 3 of 60 slices shown, 4 images]
[im 20/60  soft-tissue]
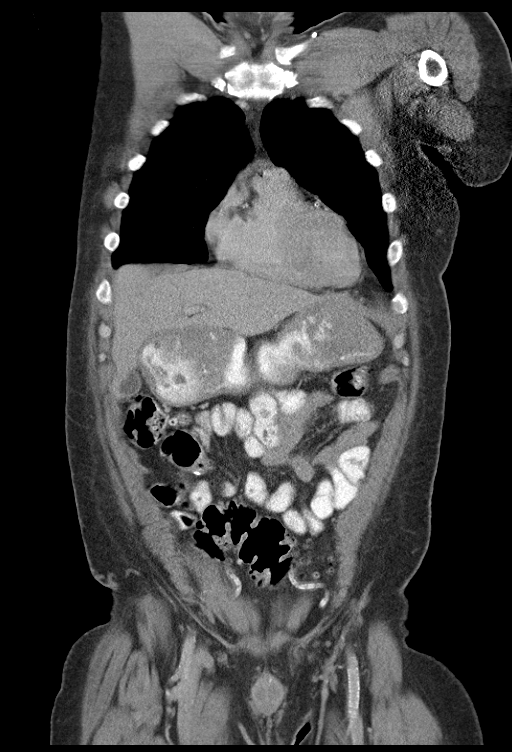
[im 27/60  soft-tissue]
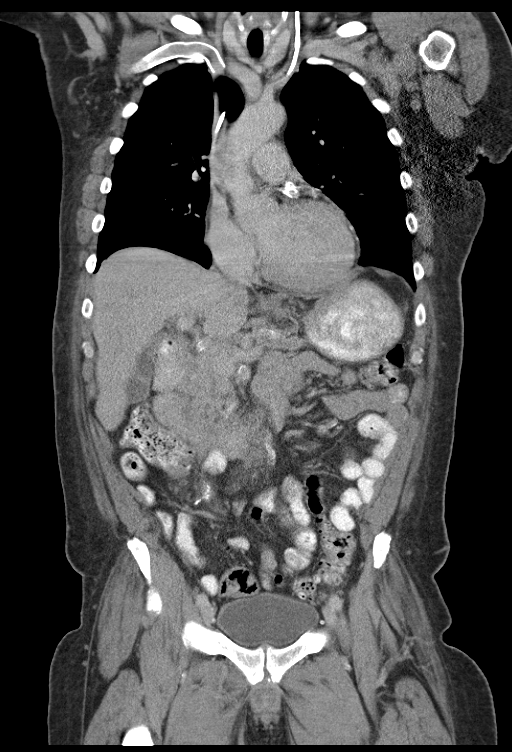
[im 27/60  bone]
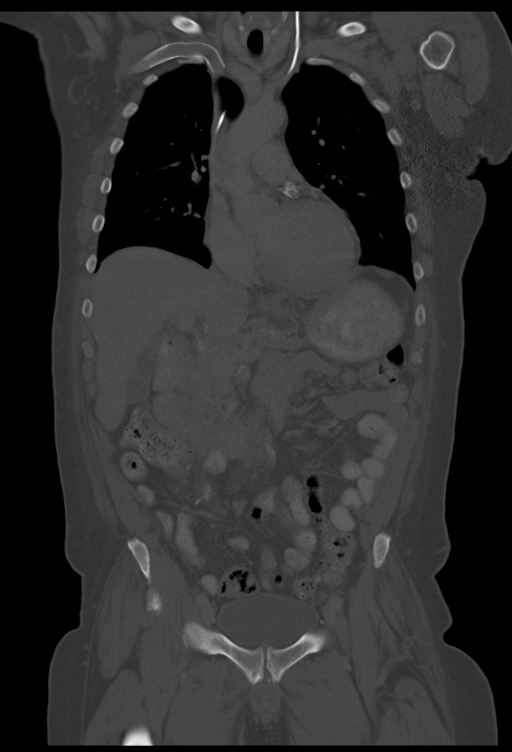
[im 33/60  soft-tissue]
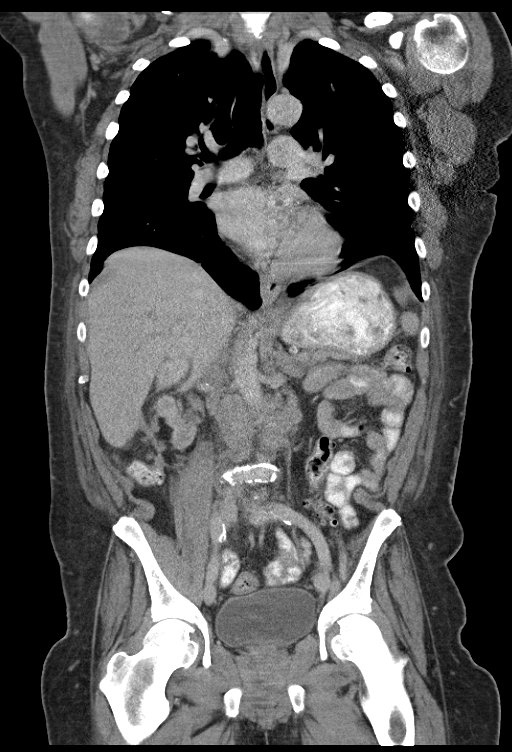

[Series 6: sagittal · sagittal · 0.71mm/px · 1 of 80 slices shown, 2 images]
[im 27/80  soft-tissue]
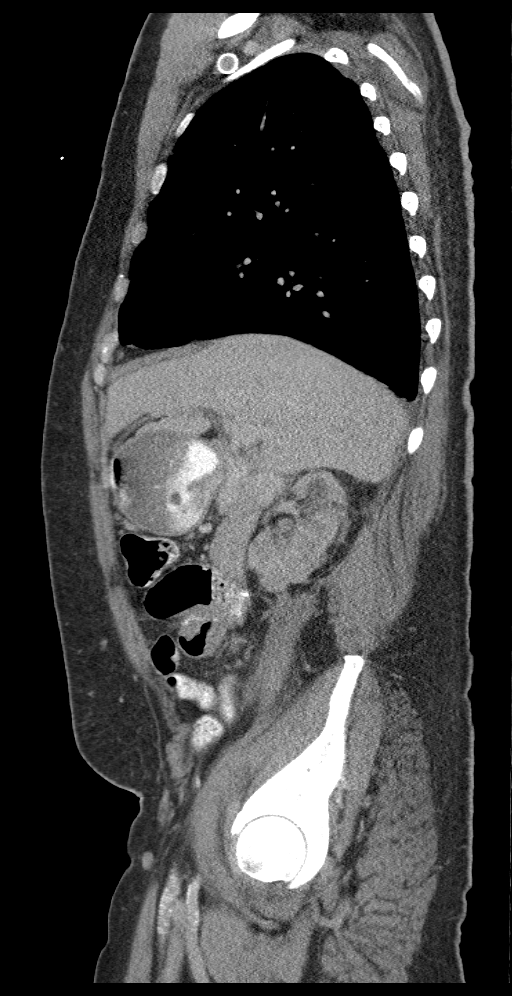
[im 27/80  bone]
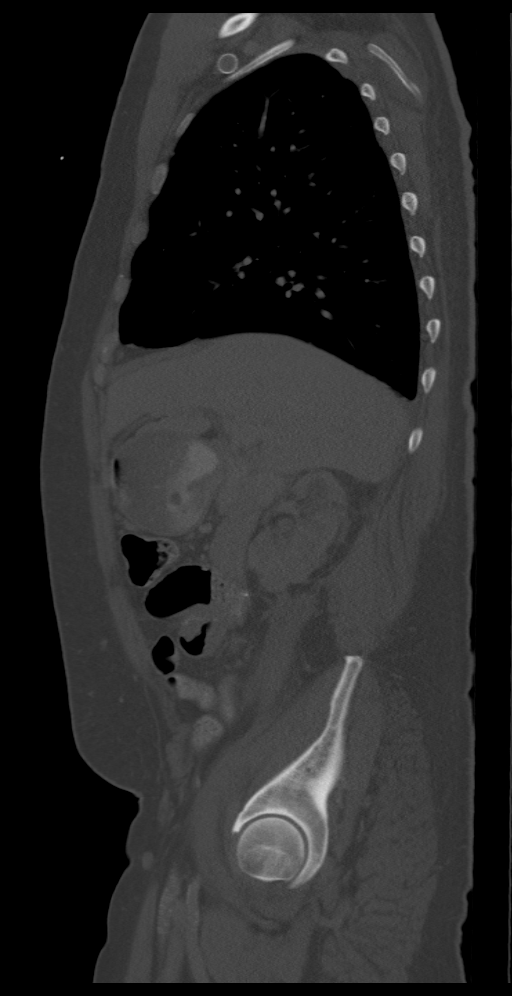

[7 of 46 positions shown; findings below may reference images not displayed]

FINDINGS: CT CHEST FINDINGS

Mediastinum/Lymph Nodes: 9 mm posterior paraesophageal lymph node is
seen on image 29/series 2, which measures 11 mm on previous study.
No other sites of lymphadenopathy identified within the thorax.
Three-vessel coronary artery calcification noted.

Lungs/Pleura: No pulmonary mass, infiltrate, or effusion. Mild
bilateral lower lobe scarring is stable. Azygos fissure again noted.

Musculoskeletal: No chest wall mass or suspicious bone lesions
identified.

CT ABDOMEN PELVIS FINDINGS

Hepatobiliary: No liver masses are identified. Tiny calcified
gallstones again seen, however there is no evidence of cholecystitis
or biliary dilatation.

Pancreas: No mass, inflammatory changes, or other significant
abnormality.

Spleen: Within normal limits in size and appearance.

Adrenals/Urinary Tract: No adrenal or renal masses are identified.
Bilateral renal parenchymal atrophy again demonstrated as well as
tiny right renal cysts. Mild right hydronephrosis is new since
previous study. No ureteral calculi identified.

Soft tissue thickening and nodularity in the right perinephric space
shows no significant interval change, and is consistent with
retroperitoneal metastatic disease.

Stomach/Bowel: No evidence of obstruction, inflammatory process, or
abnormal fluid collections. Postop changes from partial right
colectomy again demonstrated. Severe diverticulosis is again seen
involving the descending and sigmoid colon, however there is no
evidence of diverticulitis.

Vascular/Lymphatic: Abdominal retroperitoneal lymphadenopathy in the
aorto-caval and left paraaortic regions is again demonstrated. Index
lymph node in the aortocaval space measures 2.4 cm on image
69/series 2 compared to 1.3 cm previously. Index lymph node in the
left paraaortic region measures 2.6 cm on image 77/series 2 compared
to 1.9 cm previously. No pelvic lymphadenopathy identified.

No evidence of abdominal aortic aneurysm. Aortic atherosclerosis
noted.

Reproductive: No mass or other significant abnormality.

Other: None.

Musculoskeletal:  No suspicious bone lesions identified.
IMPRESSION: Mild increase in abdominal retroperitoneal lymphadenopathy since
previous study. New mild right hydronephrosis also noted.

No significant change in retroperitoneal soft tissue nodularity in
right pararenal space, consistent with metastatic disease.

Slight decrease in left mediastinal posterior paraesophageal lymph
node compared to previous PET on 08/13/2015, currently measuring 9
mm compared to 11 mm previously.

Cholelithiasis.  No radiographic evidence of cholecystitis.

Aortic atherosclerosis noted.

Colonic diverticulosis. No radiographic evidence of diverticulitis.
